# Patient Record
Sex: Female | Born: 1945 | Race: Black or African American | Hispanic: No | Marital: Single | Smoking: Former smoker
Health system: Southern US, Community
[De-identification: ages and names within clinical notes are randomized; demographics above are authoritative.]

## PROBLEM LIST (undated history)

## (undated) DIAGNOSIS — F0390 Unspecified dementia without behavioral disturbance: Secondary | ICD-10-CM

## (undated) DIAGNOSIS — I639 Cerebral infarction, unspecified: Secondary | ICD-10-CM

## (undated) HISTORY — PX: BRAIN SURGERY: SHX531

---

## 2013-07-25 ENCOUNTER — Encounter (HOSPITAL_COMMUNITY): Payer: Self-pay | Admitting: *Deleted

## 2013-07-25 ENCOUNTER — Emergency Department (HOSPITAL_COMMUNITY)
Admission: EM | Admit: 2013-07-25 | Discharge: 2013-07-25 | Disposition: A | Discharge status: Home / Self Care | Payer: Medicare Other | Attending: Emergency Medicine | Admitting: Emergency Medicine

## 2013-07-25 ENCOUNTER — Emergency Department (HOSPITAL_COMMUNITY): Payer: Medicare Other

## 2013-07-25 DIAGNOSIS — F29 Unspecified psychosis not due to a substance or known physiological condition: Secondary | ICD-10-CM | POA: Diagnosis not present

## 2013-07-25 DIAGNOSIS — F4489 Other dissociative and conversion disorders: Secondary | ICD-10-CM

## 2013-07-25 DIAGNOSIS — R4182 Altered mental status, unspecified: Secondary | ICD-10-CM | POA: Diagnosis present

## 2013-07-25 HISTORY — DX: Cerebral infarction, unspecified: I63.9

## 2013-07-25 LAB — COMPREHENSIVE METABOLIC PANEL
ALT: 10 U/L (ref 0–35)
AST: 18 U/L (ref 0–37)
Albumin: 4.3 g/dL (ref 3.5–5.2)
Alkaline Phosphatase: 89 U/L (ref 39–117)
BUN: 16 mg/dL (ref 6–23)
CO2: 26 mEq/L (ref 19–32)
Calcium: 10 mg/dL (ref 8.4–10.5)
Chloride: 101 mEq/L (ref 96–112)
Creatinine, Ser: 0.73 mg/dL (ref 0.50–1.10)
GFR calc Af Amer: >90 mL/min (ref >90)
GFR calc non Af Amer: 88 mL/min — ABNORMAL LOW (ref >90)
Glucose, Bld: 101 mg/dL — ABNORMAL HIGH (ref 70–99)
Potassium: 3.7 mEq/L (ref 3.5–5.1)
Sodium: 139 mEq/L (ref 135–145)
Total Bilirubin: 0.3 mg/dL (ref 0.3–1.2)
Total Protein: 8 g/dL (ref 6.0–8.3)

## 2013-07-25 LAB — CBC WITH DIFFERENTIAL/PLATELET
Basophils Absolute: 0 10*3/uL (ref 0.0–0.1)
Basophils Relative: 0 % (ref 0–1)
Eosinophils Absolute: 0.1 10*3/uL (ref 0.0–0.7)
Eosinophils Relative: 1 % (ref 0–5)
HCT: 35.4 % — ABNORMAL LOW (ref 36.0–46.0)
Hemoglobin: 11.6 g/dL — ABNORMAL LOW (ref 12.0–15.0)
Lymphocytes Relative: 20 % (ref 12–46)
Lymphs Abs: 1.7 10*3/uL (ref 0.7–4.0)
MCH: 29.1 pg (ref 26.0–34.0)
MCHC: 32.8 g/dL (ref 30.0–36.0)
MCV: 88.9 fL (ref 78.0–100.0)
Monocytes Absolute: 0.5 10*3/uL (ref 0.1–1.0)
Monocytes Relative: 6 % (ref 3–12)
Neutro Abs: 6.2 10*3/uL (ref 1.7–7.7)
Neutrophils Relative %: 72 % (ref 43–77)
Platelets: 295 10*3/uL (ref 150–400)
RBC: 3.98 MIL/uL (ref 3.87–5.11)
RDW: 14 % (ref 11.5–15.5)
WBC: 8.5 10*3/uL (ref 4.0–10.5)

## 2013-07-25 LAB — AMMONIA: Ammonia: 33 umol/L (ref 11–60)

## 2013-07-25 LAB — ETHANOL: Alcohol, Ethyl (B): <11 mg/dL (ref 0–11)

## 2013-07-25 NOTE — Progress Notes (Signed)
07/25/2013 [name] RNCM 1906pm [facility] consulted by ACT team member for possible home health services/community resources.  Patient does not have pcp.  EDCM provided list of pcps who accept blue medicare insurance within a ten mile radius of patient's zip code.  This list was given to patient's son [name].  Also provided information on [facility] and [address] in [place] for memory care.  As per patient's son, he has already received list of home health agencies from day shift case Production designer, theatre/television/film.  As per patient's son, he believes this is all a big mistake that she wandered. Since his mother has only been in [place] for thirty days, he believes she is just unfamiliar with the area.  He reports that when his mother was found wandering, she was fully dressed and had her purse with her personal information.  Patient's son reports the patient lives with him.  She does not have any medical equipment at home.  Patient's son reports he works at [employer] and he schedule is very flexible.  EDCM repeated the importance of finding a pcp for this patient.  Patient's son verbalized understanding.  No further needs at this time.  Patient's son thankful for services.

## 2013-07-25 NOTE — BH Assessment (Addendum)
Assessment Note  Janet Gutierrez is a 67 y.o. widowed black female.  She presents at [facility], and at the time of this assessment was accompanied in her room by her aunt, Wyn Quaker [phone]).  At the request of the pt the aunt remained in the room for the assessment.  There were many questions that the pt was unable to answer, and the aunt provided a great deal of collateral information.  I later spoke to the pt's adult son, Franky Macho, with whom pt lives and who was waiting in the lobby at [facility], obtaining further information.  The aunt reports that the pt was found wandering the streets by a female passer by, asking for directions.  Because she seemed lost and confused the passer by gave her a ride to the ED, and after going through the pt's purse, found the phone numbers of family and called them.  Stressors: Pt reportedly relocated to [facility] about a month ago to live with her 31 y/o son, this after living in [place] for about 50 years.  According to the aunt, with further detail later independently obtained from the son, pt had a stroke with a blood clot in her brain when she was about 67 y/o.  In addition to having right side hemiparesis, which gradually resolved over time, pt was told that as she aged she was likely to experience memory problems.  Lethality: Suicidality:  Pt denies SI currently or at any time in the past.  She denies any history of suicide attempts, or of self mutilation. Homicidality: Pt denies homicidal thoughts or physical aggression.  Pt denies having access to firearms.  Pt denies having any legal problems at this time.  Pt is calm, cooperative and pleasant during assessment. Psychosis: Pt denies hallucinations.  Pt does not appear to be responding to internal stimuli and exhibits no delusional thought.  Pt's reality testing appears to be intact.  However she is oriented only to person, being unable to identify her location or who the President of the [place]  is.  When asked about the date, she replies "[birth year]," and when asked about the day of the week, and whether it is day or night, she offers only random numbers.  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  When asked if she has had any significant weight change recently, she replies, "It looks like an eye."  She is unable to say why she is in the ED, responding to the question by saying, "When you come you see what happens to the people...," with the response becoming more rambling and irrelevant thereafter.  The aunt reports that pt's thoughts and behaviors have only become this bizarre over the past 30 days.  The son reports that pt is used to leaving home to shop and visit in her neighborhood in [place], but always knew how to find her way back home.  Moreover, he reports that today she did not leave appliances on, that she locked the home when she left, and that she was appropriately dressed and had her purse with her. Substance Abuse: Pt denies any current or past substance abuse problems.  Pt does not appear to be intoxicated or in withdrawal at this time.  Social Supports: In addition to the son with whom  she lives, pt has another adult son.  Franky Macho, the son with whom she lives, reports that his fiancee is also very supportive of the pt.  The accompanying aunt is clearly supportive and has good rapport with the pt.  The pt and the aunt also report that the pt has a cousin who is regularly involved with her.  Please note that most of her family supports live in the [place] area.  Pt reports that she is a widow, but requires the aunt's assistance to identify the year of the spouse's death as 68 or 47.  Treatment History: Pt denies any history of psychiatric hospitalization.  When asked about outpatient treatment  history she replies that she saw an unspecified provider for about 3 visits, 6 - 7 years ago, but given her mental status and the rambling nature of her reply, it is unclear how reliable this report is.  No one could corroborate it.  She reportedly does not have a PCP, at least in the [place] area.  At this time pt is unable to identify why she is in the ED, and much less what her treatment needs are.  Her son reports that he would prefer for his mother in to remain his household, where he believes he can keep her safe.  He reports that he would like to receive information about community resources and home health support in order to provide a living situation that is both safe and stimulating for the pt.  He is willing to adjust his work schedule in order to be with the pt during the day while alternative community based plans are being put in place.  Axis I: Dementia NOS 294.8 Axis II: Deferred 799.9 Axis III:  Past Medical History  Diagnosis Date  . CVA (cerebral vascular accident) 07/25/2013    At 67 y/o with right side hemiparesis, now largely resolved   Axis IV: problems related to social environment and problems with access to health care services Axis V: GAF = 55  Past Medical History:  Past Medical History  Diagnosis Date  . CVA (cerebral vascular accident) 07/25/2013    At 68 y/o with right side hemiparesis, now largely resolved    No past surgical history on file.  Family History: No family history on file.  Social History:  reports that she has never smoked. She has never used smokeless tobacco. She reports that she does not drink alcohol or use illicit drugs.  Additional Social History:  Alcohol / Drug Use Pain Medications: Denies Prescriptions: Denies Over the Counter: Denies History of alcohol / drug use?: No history of alcohol / drug abuse  CIWA: CIWA-Ar BP: 125/78 mmHg Pulse Rate: 81 COWS:    Allergies: No Known Allergies  Home Medications:  (Not in a  hospital admission)  OB/GYN Status:  No LMP recorded.  General Assessment Data Location of Assessment: [facility] Is this a Tele or Face-to-Face Assessment?: Face-to-Face Is this an Initial Assessment or a Re-assessment for this encounter?: Initial Assessment Living Arrangements: Children (57 y/o son) Can pt return to current living arrangement?: Yes Admission Status: Voluntary Is patient capable of signing voluntary admission?: Yes Transfer from: Acute Hospital Referral Source: Other [place])  Medical Screening Exam [facility]) Medical Exam completed: No Reason for MSE not completed: Other: (Medically cleared at [facility])  [facility] Crisis Care Plan Living Arrangements: Children (38 y/o son) Name of Psychiatrist: None Name of Therapist: None  Education Status Is patient currently in school?: No  Risk to  self Suicidal Ideation: No Suicidal Intent: No Is patient at risk for suicide?: No Suicidal Plan?: No Access to Means: No What has been your use of drugs/alcohol within the last 12 months?: Denies Previous Attempts/Gestures: No How many times?: 0 Other Self Harm Risks: At risk to wander off and lose direction home. Triggers for Past Attempts: Other (Comment) (Not applicable) Intentional Self Injurious Behavior: None Family Suicide History: No Recent stressful life event(s): Other (Comment) (Relocation to [place] 1 month ago.) Persecutory voices/beliefs?: No Depression: No Depression Symptoms:  (Denies) Substance abuse history and/or treatment for substance abuse?: No Suicide prevention information given to non-admitted patients: Yes  Risk to Others Homicidal Ideation: No Thoughts of Harm to Others: No Current Homicidal Intent: No Current Homicidal Plan: No Access to Homicidal Means: No Identified Victim: None History of harm to others?: No Assessment of Violence: None Noted Violent Behavior Description: Calm, cooperative, pleasant Does patient have access to  weapons?: No Criminal Charges Pending?: No Does patient have a court date: No  Psychosis Hallucinations: None noted Delusions: None noted  Mental Status Report Appear/Hygiene: Other (Comment) (Paper scrubs) Eye Contact: Good Motor Activity: Unremarkable Speech: Other (Comment) (Unremarkable) Level of Consciousness: Alert Mood: Other (Comment) (Pleasant, jocular) Affect: Appropriate to circumstance Anxiety Level: Minimal Thought Processes: Irrelevant Judgement: Impaired Orientation: Person (Date: [birth year]; not to day of week,hour, location, president) Obsessive Compulsive Thoughts/Behaviors: None  Cognitive Functioning Concentration: Decreased Memory: Remote Impaired;Recent Impaired IQ: Average Insight: Fair Impulse Control: Fair (Wandered off for the first time today.) Appetite: Good Weight Loss:  (Pt replies, "It looks like an eye.") Weight Gain:  (Unknown; irrelevant reply.) Sleep: No Change Total Hours of Sleep: 8 (7 - 8 hrs/night) Vegetative Symptoms: None  ADLScreening [facility] Assessment Services) Patient's cognitive ability adequate to safely complete daily activities?: Yes Patient able to express need for assistance with ADLs?: Yes Independently performs ADLs?: Yes (appropriate for developmental age)  Prior Inpatient Therapy Prior Inpatient Therapy: No  Prior Outpatient Therapy Prior Outpatient Therapy: Yes Prior Therapy Dates: 6 - 7 yrs ago, unknown outpatient provider for unspecified reason.  ADL Screening (condition at time of admission) Patient's cognitive ability adequate to safely complete daily activities?: Yes Is the patient deaf or have difficulty hearing?: No Does the patient have difficulty seeing, even when wearing glasses/contacts?: No Does the patient have difficulty concentrating, remembering, or making decisions?: Yes Patient able to express need for assistance with ADLs?: Yes Does the patient have difficulty dressing or bathing?:  No Independently performs ADLs?: Yes (appropriate for developmental age) Does the patient have difficulty walking or climbing stairs?: No Weakness of Legs: None Weakness of Arms/Hands: None  Home Assistive Devices/Equipment Home Assistive Devices/Equipment: Eyeglasses (Does not use; "they didn't work")    Abuse/Neglect Assessment (Assessment to be complete while patient is alone) Physical Abuse: Denies Verbal Abuse: Denies Sexual Abuse: Denies Exploitation of patient/patient's resources: Denies Self-Neglect: Denies Values / Beliefs Cultural Requests During Hospitalization: None Spiritual Requests During Hospitalization: None   Advance Directives (For Healthcare) Advance Directive:  (Pt does not know; given AD packet.) Nutrition Screen- [facility] Adult/[facility] Patient's home diet: Regular  Additional Information 1:1 In Past 12 Months?: No CIRT Risk: No Elopement Risk: No Does patient have medical clearance?: Yes     Disposition:  Disposition Initial Assessment Completed for this Encounter: Yes Disposition of Patient: Referred to (Case management for outpatient resources) Patient referred to: Other (Comment) (Case management for outpatient resources) After consulting with EDP Dr Gwendolyn Grant, and behavioral health provider Stephani Police, PA, it  has been determined that pt does not present a life threatening danger to herself or others, and that psychiatric hospitalization is not indicated for her.  Pt was referred to Amy, RN, Case Manager, who agreed to speak with pt's son about community resources.  Dr Gwendolyn Grant also agreed to provide further outpatient resources.  Pt to be discharged to the community.  On Site Evaluation by:   Reviewed with Physician:  EDP Dr Gwendolyn Grant @ 18:50, and Donell Sievert, PA @ 19:05  Doylene Canning, MA Triage Specialist Raphael Gibney 07/25/2013 7:26 PM

## 2013-07-25 NOTE — ED Provider Notes (Signed)
Care from Dr. Juleen China. Awaiting Psych assessment. Psych states patient is stable for discharge. She is not acutely pyschotic, she has had progressive worsening of her condition. They feel this is likely dementia. Patient here doing well, relaxing. Psych believes an adult program for patient's her age would be appropriate. Resources given. Son will stay with patient for next 30 days. Stable for discharge.   1. Confusion state      Dagmar Hait, MD 07/25/13 1939

## 2013-07-25 NOTE — ED Notes (Signed)
Pt aaox2.  Pt present with confusion and wandering.  Pt found by ems.  Pt has no hx per her cousin from [place].

## 2013-07-25 NOTE — ED Notes (Signed)
Bed: WA03 Expected date:  Expected time:  Means of arrival:  Comments: Found wandering

## 2013-07-25 NOTE — ED Notes (Signed)
Pt present to ED via EMS with c/o "wandering and confusion"  Pt present via EMS found wandering on [address]  Pt alert to person and situation.  Pt reports "I was trying to go to aunt Albertha house because she is nice>"  Pt deneis pain.  Per EMS, pt moved to [place] from [place] by son to live with him.  Per ems, pt cousin from [place] was contacted reports confusion and no other hx known.  Pt cousin, "cookie" is inroute to hospital.  Pt son ane is [place] [phone].  Unable to contact phone.

## 2013-07-25 NOTE — ED Provider Notes (Signed)
CSN: [id number]     Arrival date & time 07/25/13  1230 History   First MD Initiated Contact with Patient 07/25/13 1241     Chief Complaint  Patient presents with  . Altered Mental Status   (Consider location/radiation/quality/duration/timing/severity/associated sxs/prior Treatment) HPI  67yF found wandering. Pt unable to tell me how she ended up where she was picked up or where she was going. Seems to think she is in [place]. She appears to be in NAD, but difficulty answering simple questions such as where she lives or what her children's names are. Denies trauma. No pain. Denies ingestion. Doesn't seem to have diagnosed psych history, but I do not feel she is a reliable historian. Denies taking any medications.   No past medical history on file. No past surgical history on file. No family history on file. History  Substance Use Topics  . Smoking status: Not on file  . Smokeless tobacco: Not on file  . Alcohol Use: Not on file   OB History   No data available     Review of Systems  All systems reviewed and negative, other than as noted in HPI.   Allergies  Review of patient's allergies indicates not on file.  Home Medications  No current outpatient prescriptions on file. BP 133/51  Pulse 84  Temp(Src) 98.9 F (37.2 C) (Oral)  Resp 18  SpO2 97% Physical Exam  Nursing note and vitals reviewed. Constitutional: She appears well-developed and well-nourished. No distress.  HENT:  Head: Normocephalic and atraumatic.  Eyes: Conjunctivae and EOM are normal. Pupils are equal, round, and reactive to light. Right eye exhibits no discharge. Left eye exhibits no discharge.  Neck: Neck supple.  Cardiovascular: Normal rate, regular rhythm and normal heart sounds.  Exam reveals no gallop and no friction rub.   No murmur heard. Pulmonary/Chest: Effort normal and breath sounds normal. No respiratory distress.  Abdominal: Soft. She exhibits no distension. There is no tenderness.   Musculoskeletal: She exhibits no edema and no tenderness.  Neurological: She is alert. No cranial nerve deficit. She exhibits normal muscle tone. Coordination normal.  Disoriented to place. Gait steady.   Skin: Skin is warm and dry.  Psychiatric:  Pleasant and agreeable. Cooperative. Seems easily distracted. Possible thought blocking?     ED Course  Procedures (including critical care time) Labs Review Labs Reviewed  CBC WITH DIFFERENTIAL - Abnormal; Notable for the following:    Hemoglobin 11.6 (*)    HCT 35.4 (*)    All other components within normal limits  COMPREHENSIVE METABOLIC PANEL - Abnormal; Notable for the following:    Glucose, Bld 101 (*)    GFR calc non Af Amer 88 (*)    All other components within normal limits  ETHANOL  AMMONIA  URINALYSIS, ROUTINE W REFLEX MICROSCOPIC  URINE RAPID DRUG SCREEN (HOSP PERFORMED)   Imaging Review Ct Head Wo Contrast  07/25/2013   CLINICAL DATA:  Confusion, altered mental status  EXAM: CT HEAD WITHOUT CONTRAST  TECHNIQUE: Contiguous axial images were obtained from the base of the skull through the vertex without intravenous contrast.  COMPARISON:  None.  FINDINGS: No evidence of parenchymal hemorrhage or extra-axial fluid collection. No mass lesion, mass effect, or midline shift.  No CT evidence of acute infarction.  Encephalomalacic changes in the right cerebellum with overlying craniotomy.  Subcortical white matter and periventricular small vessel ischemic changes.  Cerebral volume is age appropriate. No ventriculomegaly.  The visualized paranasal sinuses are essentially clear. The mastoid  air cells are unopacified.  No evidence of calvarial fracture.  IMPRESSION: No evidence of acute intracranial abnormality.  Encephalomalacic changes in the right cerebellum with overlying craniotomy.  Small vessel ischemic changes.   Electronically Signed   By: Charline Bills M.D.   On: 07/25/2013 13:35    EKG Interpretation   None       MDM    1. Confusion state     67 year old female found wandering. He appears to have some degree of cognitive impairment or psychiatric illness. No past psychiatric care or diagnoses. Son now at bedside. The symptoms have been present for at least several months. Son himself is not particularly good historian either. Pt does have past history of stroke and CT with evidence of this. This stroke seems to have preceded her current symptoms by quite some time though. There does not appear to be an acute organic medical basis for her current symptoms. I feel she would benefit from a psychiatric assessment.    Raeford Razor, MD 07/25/13 [phone]

## 2013-07-25 NOTE — Progress Notes (Signed)
   CARE MANAGEMENT ED NOTE 07/25/2013  Patient:  ROSANNE, WOHLFARTH   Account Number:  [id number]  Date Initiated:  07/25/2013  Documentation initiated by:  Edd Arbour  Subjective/Objective Assessment:   68 yr old blue medicare pt with female and female family members at bedside Female confirmed pt without pcp Provided updated insurance card for Cm to present to ED registration c/o found wandering in [place] [address]     Subjective/Objective Assessment Detail:     Action/Plan:   Cm spoke with pt, female cousin and female, son, quinton, at bedside. Cm took updated insurance card to [facility] ED registration for pt/family CM called and spoke with ED SW about case concerns TTS consult noted in EPIC   Action/Plan Detail:   Spoke with Quinton to discuss need to find an in network pcp for blue medicare in [place] by contacting the [employer] on back of insurance card   Anticipated DC Date:       Status Recommendation to Physician:   Result of Recommendation:    Other ED Services  Consult Working Plan   In-house referral  Clinical Social Worker   DC Associate Professor  Other  PCP issues  Outpatient Services - Pt will follow up    Choice offered to / List presented to:            Status of service:  Completed, signed off  ED Comments:  CM made attempt to find a list online but unable to get [employer] online site to activate.  Provided a list of [place] home health agencies and private duty nursing services. Provided information on [facility]  ED Comments Detail:  adult day care, respite programs and caregiver services

## 2013-09-06 ENCOUNTER — Ambulatory Visit (INDEPENDENT_AMBULATORY_CARE_PROVIDER_SITE_OTHER): Payer: Medicare Other | Admitting: Neurology

## 2013-09-06 ENCOUNTER — Encounter: Payer: Self-pay | Admitting: Neurology

## 2013-09-06 VITALS — BP 144/70 | HR 80 | Temp 98.3°F | Wt 237.0 lb

## 2013-09-06 DIAGNOSIS — F0390 Unspecified dementia without behavioral disturbance: Secondary | ICD-10-CM

## 2013-09-06 DIAGNOSIS — F068 Other specified mental disorders due to known physiological condition: Secondary | ICD-10-CM

## 2013-09-06 NOTE — Addendum Note (Signed)
Addended by: Benay Spice on: 09/06/2013 04:51 PM   Modules accepted: Orders

## 2013-09-06 NOTE — Progress Notes (Signed)
NEUROLOGY CONSULTATION NOTE  Janet Gutierrez MRN: [id number] DOB: [birth year]  Referring provider: Dr. Lonzo Gutierrez Primary care provider: Dr. Lonzo Gutierrez  Reason for consult:  Dementia.  HISTORY OF PRESENT ILLNESS: Janet Gutierrez is a 67 year old left-handed (since stroke) woman with history of hemorrhagic stroke who presents for evaluation of dementia.  Records and images were personally reviewed where available.    She was brought to the [facility] ED on 07/25/13 in a confused state.  As per the chart, she was found wandering.  She was oriented to self and situation but unable to tell the physician her destination.  She thought she was in [name].  She was unable to answer her address or the names of her children.  According to her son, this was a misunderstanding.  They had just moved to [name] from [name] because he got a new job.  They live together.  He says that she went out for a walk and a passerby thought she was confused.  He says the passerby saw her ID which had her Janet Gutierrez address and then took her to the ED.  He says that she did not know the names of her children because she must have thought they meant young children and not her grown two sons.  A CT of the brain was performed, which revealed encephalomalacia in the right cerebellum with craniotomy.  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  CBC and CMP unremarkable.  Ammonia 33.  ETOH level negative.  She was evaluated by psychiatry who determined she was not psychotic.  They felt she had an underlying dementia.  She has lived with her son for some time.  Her son says that she is disinterested and withdrawn.  This has been going on for at least a year.  It seemed to have started after her son lost his job in [place].  At that point, she no longer was going out shopping and stayed in the home.  She has not been social for quite some time, but her son says that is because most of  her friends have since passed away.  She reportedly is able to perform all activities of daily living including showering, dressing, and cooking.  She does not leave the stove on at home.  While her son is at work, she is alone and occupies her time by doing housework.  She reportedly has some difficulty remembering remote details such as past dates, however she can recall and sing along to many old songs on the radio.  Reportedly, her son says that short-term memory is intact.  She does not read much but watches TV.  She denies depression.  Son denies mood or personality changes.  Son denies hallucinations or delusions.  She was driving several months ago, but has not driven since at least moving to [name] in early October.  There is no reported history of dementia in the family.  She recently established care with Dr. Lonzo Gutierrez at [facility] [place].  He ordered TSH and B12 levels, results not available.  She had a hypertensive hemorrhagic stroke of the cerebellum at age 37, with subsequent right hemiparesis and craniotomy.  PAST MEDICAL HISTORY: Past Medical History  Diagnosis Date  . CVA (cerebral vascular accident)     At 67 y/o with right side hemiparesis, now largely resolved    PAST SURGICAL HISTORY: Past Surgical History  Procedure Laterality Date  . Brain surgery  age 55    MEDICATIONS: No current outpatient prescriptions on file prior to visit.   No current facility-administered medications on file prior to visit.    ALLERGIES: No Known Allergies  FAMILY HISTORY: No family history on file.  SOCIAL HISTORY: History   Social History  . Marital Status: Single    Spouse Name: N/A    Number of Children: N/A  . Years of Education: N/A   Occupational History  . Not on file.   Social History Main Topics  . Smoking status: Former Smoker    Quit date: 08/29/2009  . Smokeless tobacco: Never Used  . Alcohol Use: No  . Drug Use: No  . Sexual Activity: Not  on file   Other Topics Concern  . Not on file   Social History Narrative  . No narrative on file    REVIEW OF SYSTEMS: Constitutional: No fevers, chills, or sweats, no generalized fatigue, change in appetite Eyes: No visual changes, double vision, eye pain Ear, nose and throat: No hearing loss, ear pain, nasal congestion, sore throat Cardiovascular: No chest pain, palpitations Respiratory:  No shortness of breath at rest or with exertion, wheezes GastrointestinaI: No nausea, vomiting, diarrhea, abdominal pain, fecal incontinence Genitourinary:  No dysuria, urinary retention or frequency Musculoskeletal:  No neck pain, back pain Integumentary: No rash, pruritus, skin lesions Neurological: as above Psychiatric: No depression, insomnia, anxiety Endocrine: No palpitations, fatigue, diaphoresis, mood swings, change in appetite, change in weight, increased thirst Hematologic/Lymphatic:  No anemia, purpura, petechiae. Allergic/Immunologic: no itchy/runny eyes, nasal congestion, recent allergic reactions, rashes  PHYSICAL EXAM: Filed Vitals:   09/06/13 1457  BP: 144/70  Pulse: 80  Temp: 98.3 F (36.8 C)   General: No acute distress.  Unkempt. Head:  Normocephalic/atraumatic Neck: supple, no paraspinal tenderness, full range of motion Back: No paraspinal tenderness Heart: regular rate and rhythm Lungs: Clear to auscultation bilaterally. Vascular: No carotid bruits. Neurological Exam: Mental status: alert and oriented to self only (said she is in [name]).  Speech fluent and not dysarthric.  Unable to perform Trail Making test, copy a cube or correctly draw a clock face.  Unable to name 2 of 3 animals.  Able to repeat 1 of 2 sentences.  Unable to follow some simple commands.  She exhibited poor attention and unable to repeat list of numbers in forward or reverse order.  Unable to perform serial 7 subtraction.  Unable to name one word that starts with letter F.  Poor abstraction.   Poor immediate and delayed recall (unable to repeat or recall any words).  MOCA score 2/30. Cranial nerves: CN I: not tested CN II: pupils equal, round and reactive to light, visual fields intact, fundi not visualized. CN III, IV, VI:  full range of motion, no nystagmus, no ptosis CN V: facial sensation intact CN VII: upper and lower face symmetric CN VIII: hearing intact CN IX, X: gag intact, uvula midline CN XI: sternocleidomastoid and trapezius muscles intact CN XII: tongue midline Bulk & Tone: normal, no fasciculations. Motor: 5/5 throughout Sensation: pinprick and vibration intact. Deep Tendon Reflexes: 2+ throughout, absent in ankles, toes down Finger to nose testing:  No dysmetria Gait: wide-based, no ataxia.  Unable to tandem walk. Romberg negative.  IMPRESSION: Advanced cognitive impairment.  Most likely Alzheimer's, however, since multiple domains are now involved, and her son is a poor historian, it is difficult to say for sure.  PLAN: 1.  Will refer for formal neuropsychological testing. 2.  Will obtain lab  results (TSH and B12) from [facility]. 3.  Instructed to son that she cannot drive or go for walks by herself. 4.  Will likely start Namenda. 5.  Discussed with son that she may need 24 hour care and will likely need Home Health evaluation. 6.  Follow up right after neuropsych test.  45 minutes spent with patient and son, over 50% spent counseling and coordinating care.  Thank you for allowing me to take part in the care of this patient.  Shon Millet, DO  CC:  Orland Penman, MD

## 2013-09-06 NOTE — Patient Instructions (Addendum)
1.  We will refer for neuropsychological evaluation. 2.  No driving or going for a walk by herself. 3.  Follow up after neuropsych testing.   I will refer you to [facility] for your neuropsychological testing. They are located at [address] in [place], [place].  The phone number is [phone]. They will call you to schedule the appointment.

## 2013-11-25 ENCOUNTER — Ambulatory Visit: Payer: Medicare Other | Admitting: Neurology

## 2013-11-28 ENCOUNTER — Ambulatory Visit: Payer: Medicare Other | Admitting: Neurology

## 2013-11-30 ENCOUNTER — Ambulatory Visit (INDEPENDENT_AMBULATORY_CARE_PROVIDER_SITE_OTHER): Payer: Medicare Other | Admitting: Neurology

## 2013-11-30 ENCOUNTER — Other Ambulatory Visit: Payer: Self-pay | Admitting: *Deleted

## 2013-11-30 ENCOUNTER — Encounter: Payer: Self-pay | Admitting: Neurology

## 2013-11-30 VITALS — BP 104/78 | HR 68 | Resp 18 | Ht 65.5 in | Wt 227.4 lb

## 2013-11-30 DIAGNOSIS — G3183 Dementia with Lewy bodies: Principal | ICD-10-CM

## 2013-11-30 DIAGNOSIS — G309 Alzheimer's disease, unspecified: Principal | ICD-10-CM

## 2013-11-30 DIAGNOSIS — F0280 Dementia in other diseases classified elsewhere without behavioral disturbance: Secondary | ICD-10-CM

## 2013-11-30 MED ORDER — MEMANTINE HCL 5 MG PO TABS: ORAL_TABLET | ORAL | Status: DC

## 2013-11-30 NOTE — Progress Notes (Signed)
NEUROLOGY FOLLOW UP OFFICE NOTE  Janet Gutierrez [id number]  HISTORY OF PRESENT ILLNESS: Janet Gutierrez is a 68 year old left-handed (since stroke) woman with history of hemorrhagic stroke who follows up for evaluation of dementia.  Records and images were personally reviewed where available.    UPDATE: She underwent neuropsychological testing on 11/07/13 and 11/15/13.  Testing did reveal advanced global impairment but most likely suggested Alzheimer's disease.  She reportedly has not gone outside the house alone.    HISTORY: She was brought to the [facility] ED on 07/25/13 in a confused state.  As per the chart, she was found wandering.  She was oriented to self and situation but unable to tell the physician her destination.  She thought she was in [place].  She was unable to answer her address or the names of her children.  According to her son, this was a misunderstanding.  They had just moved to [facility] from [place] because he got a new job.  They live together.  He says that she went out for a walk and a passerby thought she was confused.  He says the passerby saw her ID which had her [name] address and then took her to the ED.  He says that she did not know the names of her children because she must have thought they meant young children and not her grown two sons.  A CT of the brain was performed, which revealed encephalomalacia in the right cerebellum with craniotomy.  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  CBC and CMP unremarkable.  Ammonia 33.  ETOH level negative.  She was evaluated by psychiatry who determined she was not psychotic.  They felt she had an underlying dementia.  She has lived with her son for some time.  Her son says that she is disinterested and withdrawn.  This has been going on for at least a year.  It seemed to have started after her son lost his job in [place].  At that point, she no longer was going out shopping and  stayed in the home.  She has not been social for quite some time, but her son says that is because most of her friends have since passed away.  She reportedly is able to perform all activities of daily living including showering, dressing, and cooking.  She does not leave the stove on at home.  While her son is at work, she is alone and occupies her time by doing housework.  She reportedly has some difficulty remembering remote details such as past dates, however she can recall and sing along to many old songs on the radio.  Reportedly, her son says that short-term memory is intact.  She does not read much but watches TV.  She denies depression.  Son denies mood or personality changes.  Son denies hallucinations or delusions.  She was driving several months ago, but has not driven since at least moving to [facility] in early October.  There is no reported history of dementia in the family.  She lives with her son, but he is not home around 16 hours of the day.  She recently established care with Dr. Lonzo Cloud at [facility] at [place].  He ordered TSH and B12 levels, results not available.  She had a hypertensive hemorrhagic stroke of the cerebellum at age 80, with subsequent right hemiparesis and craniotomy.  PAST MEDICAL HISTORY: Past Medical History  Diagnosis Date  . CVA (cerebral  vascular accident)     At 68 y/o with right side hemiparesis, now largely resolved    MEDICATIONS: No current outpatient prescriptions on file prior to visit.   No current facility-administered medications on file prior to visit.    ALLERGIES: No Known Allergies  FAMILY HISTORY: History reviewed. No pertinent family history.  SOCIAL HISTORY: History   Social History  . Marital Status: Single    Spouse Name: N/A    Number of Children: N/A  . Years of Education: N/A   Occupational History  . Not on file.   Social History Main Topics  . Smoking status: Former Smoker    Quit date: 08/29/2009  .  Smokeless tobacco: Never Used  . Alcohol Use: No  . Drug Use: No  . Sexual Activity: Not on file   Other Topics Concern  . Not on file   Social History Narrative  . No narrative on file    REVIEW OF SYSTEMS: Constitutional: No fevers, chills, or sweats, no generalized fatigue, change in appetite Eyes: No visual changes, double vision, eye pain Ear, nose and throat: No hearing loss, ear pain, nasal congestion, sore throat Cardiovascular: No chest pain, palpitations Respiratory:  No shortness of breath at rest or with exertion, wheezes GastrointestinaI: No nausea, vomiting, diarrhea, abdominal pain, fecal incontinence Genitourinary:  No dysuria, urinary retention or frequency Musculoskeletal:  No neck pain, back pain Integumentary: No rash, pruritus, skin lesions Neurological: as above Psychiatric: No depression, insomnia, anxiety Endocrine: No palpitations, fatigue, diaphoresis, mood swings, change in appetite, change in weight, increased thirst Hematologic/Lymphatic:  No anemia, purpura, petechiae. Allergic/Immunologic: no itchy/runny eyes, nasal congestion, recent allergic reactions, rashes  PHYSICAL EXAM: Filed Vitals:   11/30/13 0953  BP: 104/78  Pulse: 68  Resp: 18   General: No acute distress Head:  Normocephalic/atraumatic Neck: supple, no paraspinal tenderness, full range of motion Heart:  Regular rate and rhythm Lungs:  Clear to auscultation bilaterally Back: No paraspinal tenderness Neurological Exam: Alert and oriented to self and state only (not time).  Speech fluent and not dysarthric.  Able to name, repeat, and read.  Unable to write or follow 3 step commands across midline.  Unable to recall 3 words after a couple of minutes.  Unable to spell WORLD backwards, unable to copy intersecting pentagons.  MMSE 9/30.  CN II-XII intact.  Bulk and tone normal.  Muscle strength 5/5 throughout.  Sensation to light touch intact.  No dysmetria on finger to nose testing.   Wide-based gait.  Romberg negative.  IMPRESSION: Alzheimer's dementia  PLAN: 1.  Will initiate Namenda.  Will start 5mg  daily for 1 week, then 5mg  twice daily for 1 week, then goal of 10mg  twice daily.  Side effects such as headache, diarrhea, and constipation were discussed. 2.  Patient needs 24 hour supervision.  Will try to arrange for a home health visit. 3.  Will try and get B12 and TSH results from PCP. 4.  Follow up in 6 months.  Shon MilletAdam Jaffe, DO  CC:  Orland PenmanIbtehal Jaralla Shamleffer, MD

## 2013-11-30 NOTE — Patient Instructions (Signed)
1.  Start Namenda (memantine) 5mg  tablets.  Take 1 tablet at bedtime for 7 days, then 1 tablet twice daily for 7 days, then 1 tablet in morning and 2 tablets at bedtime for 7 days, then 2tablets twice daily.   Side effects include dizziness, headache, diarrhea or constipation.  Call with any questions or concerns.

## 2013-12-09 ENCOUNTER — Telehealth: Payer: Self-pay | Admitting: *Deleted

## 2013-12-09 NOTE — Telephone Encounter (Signed)
error 

## 2014-02-23 ENCOUNTER — Ambulatory Visit: Payer: Self-pay | Admitting: Podiatrist

## 2014-05-31 ENCOUNTER — Ambulatory Visit: Payer: Medicare Other | Admitting: Neurology

## 2014-05-31 ENCOUNTER — Telehealth: Payer: Self-pay | Admitting: Neurology

## 2014-05-31 NOTE — Telephone Encounter (Signed)
Pt no showed today's follow up appt w/ Dr. Everlena Cooper.    Marlane Hatcher - please send pt a no show letter / Oneita Kras.

## 2014-06-01 ENCOUNTER — Encounter: Payer: Self-pay | Admitting: *Deleted

## 2014-06-01 NOTE — Progress Notes (Signed)
No show letter sent for 05-26-2014 

## 2015-01-29 ENCOUNTER — Ambulatory Visit: Admission: AD | Admit: 2015-01-29 | Payer: Self-pay | Source: Ambulatory Visit | Admitting: Vascular Surgery

## 2015-01-29 ENCOUNTER — Encounter: Admission: AD | Payer: Self-pay | Source: Ambulatory Visit

## 2015-01-29 SURGERY — A/V SHUNTOGRAM/FISTULAGRAM
Anesthesia: Moderate Sedation

## 2017-04-14 ENCOUNTER — Ambulatory Visit
Admission: RE | Admit: 2017-04-14 | Discharge: 2017-04-14 | Disposition: A | Discharge status: Home / Self Care | Payer: Medicare (Managed Care) | Source: Ambulatory Visit | Attending: Nurse Practitioner | Admitting: Nurse Practitioner

## 2017-04-14 ENCOUNTER — Other Ambulatory Visit: Payer: Self-pay

## 2017-04-14 ENCOUNTER — Other Ambulatory Visit: Payer: Self-pay | Admitting: Nurse Practitioner

## 2017-04-14 DIAGNOSIS — M25562 Pain in left knee: Secondary | ICD-10-CM

## 2017-04-14 DIAGNOSIS — T1490XA Injury, unspecified, initial encounter: Secondary | ICD-10-CM

## 2017-04-14 DIAGNOSIS — M79605 Pain in left leg: Secondary | ICD-10-CM

## 2017-05-01 ENCOUNTER — Emergency Department (HOSPITAL_COMMUNITY): Payer: Medicare (Managed Care)

## 2017-05-01 ENCOUNTER — Observation Stay (HOSPITAL_COMMUNITY): Payer: Medicare (Managed Care)

## 2017-05-01 ENCOUNTER — Inpatient Hospital Stay (HOSPITAL_COMMUNITY)
Admission: EM | Admit: 2017-05-01 | Discharge: 2017-05-04 | DRG: 101 | Disposition: A | Discharge status: Home / Self Care | Payer: Medicare (Managed Care) | Attending: Oncology | Admitting: Oncology

## 2017-05-01 ENCOUNTER — Encounter (HOSPITAL_COMMUNITY): Payer: Self-pay | Admitting: Emergency Medicine

## 2017-05-01 DIAGNOSIS — R404 Transient alteration of awareness: Secondary | ICD-10-CM

## 2017-05-01 DIAGNOSIS — R4182 Altered mental status, unspecified: Secondary | ICD-10-CM | POA: Diagnosis present

## 2017-05-01 DIAGNOSIS — F0280 Dementia in other diseases classified elsewhere without behavioral disturbance: Secondary | ICD-10-CM | POA: Diagnosis present

## 2017-05-01 DIAGNOSIS — G309 Alzheimer's disease, unspecified: Secondary | ICD-10-CM | POA: Diagnosis present

## 2017-05-01 DIAGNOSIS — R569 Unspecified convulsions: Secondary | ICD-10-CM | POA: Diagnosis not present

## 2017-05-01 DIAGNOSIS — F0150 Vascular dementia without behavioral disturbance: Secondary | ICD-10-CM | POA: Diagnosis present

## 2017-05-01 DIAGNOSIS — R410 Disorientation, unspecified: Secondary | ICD-10-CM | POA: Diagnosis not present

## 2017-05-01 DIAGNOSIS — Z87891 Personal history of nicotine dependence: Secondary | ICD-10-CM

## 2017-05-01 DIAGNOSIS — G319 Degenerative disease of nervous system, unspecified: Secondary | ICD-10-CM | POA: Diagnosis present

## 2017-05-01 DIAGNOSIS — Z8673 Personal history of transient ischemic attack (TIA), and cerebral infarction without residual deficits: Secondary | ICD-10-CM

## 2017-05-01 DIAGNOSIS — Z79899 Other long term (current) drug therapy: Secondary | ICD-10-CM

## 2017-05-01 DIAGNOSIS — G9389 Other specified disorders of brain: Secondary | ICD-10-CM | POA: Diagnosis present

## 2017-05-01 HISTORY — DX: Unspecified dementia, unspecified severity, without behavioral disturbance, psychotic disturbance, mood disturbance, and anxiety: F03.90

## 2017-05-01 LAB — CBC WITH DIFFERENTIAL/PLATELET
Basophils Absolute: 0 10*3/uL (ref 0.0–0.1)
Basophils Relative: 0 %
Eosinophils Absolute: 0 10*3/uL (ref 0.0–0.7)
Eosinophils Relative: 1 %
HEMATOCRIT: 32.7 % — AB (ref 36.0–46.0)
HEMOGLOBIN: 11 g/dL — AB (ref 12.0–15.0)
LYMPHS ABS: 1.4 10*3/uL (ref 0.7–4.0)
LYMPHS PCT: 27 %
MCH: 30.6 pg (ref 26.0–34.0)
MCHC: 33.6 g/dL (ref 30.0–36.0)
MCV: 90.8 fL (ref 78.0–100.0)
Monocytes Absolute: 0.4 10*3/uL (ref 0.1–1.0)
Monocytes Relative: 7 %
NEUTROS ABS: 3.5 10*3/uL (ref 1.7–7.7)
NEUTROS PCT: 65 %
Platelets: 333 10*3/uL (ref 150–400)
RBC: 3.6 MIL/uL — AB (ref 3.87–5.11)
RDW: 13.7 % (ref 11.5–15.5)
WBC: 5.3 10*3/uL (ref 4.0–10.5)

## 2017-05-01 LAB — RAPID URINE DRUG SCREEN, HOSP PERFORMED
Amphetamines: NOT DETECTED
BENZODIAZEPINES: NOT DETECTED
Barbiturates: NOT DETECTED
Cocaine: NOT DETECTED
Opiates: NOT DETECTED
Tetrahydrocannabinol: NOT DETECTED

## 2017-05-01 LAB — BASIC METABOLIC PANEL
Anion gap: 8 (ref 5–15)
BUN: 11 mg/dL (ref 6–20)
CHLORIDE: 102 mmol/L (ref 101–111)
CO2: 30 mmol/L (ref 22–32)
Calcium: 9.7 mg/dL (ref 8.9–10.3)
Creatinine, Ser: 0.64 mg/dL (ref 0.44–1.00)
GFR calc Af Amer: >60 mL/min (ref >60)
GFR calc non Af Amer: >60 mL/min (ref >60)
GLUCOSE: 97 mg/dL (ref 65–99)
POTASSIUM: 4 mmol/L (ref 3.5–5.1)
Sodium: 140 mmol/L (ref 135–145)

## 2017-05-01 LAB — TROPONIN I

## 2017-05-01 LAB — URINALYSIS, ROUTINE W REFLEX MICROSCOPIC
Bilirubin Urine: NEGATIVE
Glucose, UA: NEGATIVE mg/dL
HGB URINE DIPSTICK: NEGATIVE
Ketones, ur: NEGATIVE mg/dL
Leukocytes, UA: NEGATIVE
NITRITE: NEGATIVE
Protein, ur: NEGATIVE mg/dL
SPECIFIC GRAVITY, URINE: 1.011 (ref 1.005–1.030)
pH: 7 (ref 5.0–8.0)

## 2017-05-01 LAB — AMMONIA: AMMONIA: 13 umol/L (ref 9–35)

## 2017-05-01 LAB — VITAMIN B12: VITAMIN B 12: 905 pg/mL (ref 180–914)

## 2017-05-01 LAB — ETHANOL: Alcohol, Ethyl (B): <5 mg/dL (ref <5)

## 2017-05-01 LAB — TSH: TSH: 1.324 u[IU]/mL (ref 0.350–4.500)

## 2017-05-01 MED ORDER — ACETAMINOPHEN 650 MG RE SUPP
650.0000 mg | Freq: Four times a day (QID) | RECTAL | Status: DC | PRN
Start: 2017-05-01 — End: 2017-05-04

## 2017-05-01 MED ORDER — SODIUM CHLORIDE 0.9% FLUSH
3.0000 mL | Freq: Two times a day (BID) | INTRAVENOUS | Status: DC
  Administered 2017-05-01 – 2017-05-03 (×4): 3 mL via INTRAVENOUS

## 2017-05-01 MED ORDER — DEXTROSE-NACL 5-0.45 % IV SOLN
INTRAVENOUS | Status: AC
  Administered 2017-05-01: 10:00:00 via INTRAVENOUS

## 2017-05-01 MED ORDER — SODIUM CHLORIDE 0.9 % IV SOLN
1500.0000 mg | Freq: Once | INTRAVENOUS | Status: AC
  Administered 2017-05-01: 1500 mg via INTRAVENOUS
  Filled 2017-05-01: qty 15

## 2017-05-01 MED ORDER — ENOXAPARIN SODIUM 40 MG/0.4ML ~~LOC~~ SOLN
40.0000 mg | Freq: Every day | SUBCUTANEOUS | Status: DC
  Administered 2017-05-01 – 2017-05-04 (×4): 40 mg via SUBCUTANEOUS
  Filled 2017-05-01 (×5): qty 0.4

## 2017-05-01 MED ORDER — SODIUM CHLORIDE 0.9 % IV SOLN
500.0000 mg | Freq: Two times a day (BID) | INTRAVENOUS | Status: DC
  Administered 2017-05-01 – 2017-05-03 (×4): 500 mg via INTRAVENOUS
  Filled 2017-05-01 (×6): qty 5

## 2017-05-01 MED ORDER — ACETAMINOPHEN 325 MG PO TABS: 650.0000 mg | ORAL_TABLET | Freq: Four times a day (QID) | ORAL | Status: DC | PRN

## 2017-05-01 NOTE — ED Triage Notes (Signed)
Pt arrived EMS from home her son stating that when he got her up to go to the bathroom at 12am this morning he walked her to the bathroom and after she went to the bathroom when he attempted to get her back up to go to the bed she "stiffened" and wouldn't get up from the toilet

## 2017-05-01 NOTE — Progress Notes (Signed)
   05/01/17 1103  Seizure Activity  Psychomotor Symptoms Behavior pause;Incontinence  Duration 5  Post- Ictal Other (Comment) (BACK TO BASELINE)  Interventions Airway support;Medication;Suction;Side-lying Position;Seizure Pads;Notified MD/Provider  Tasking (Response) Aware of seizure;Unable to respond   Vital signs stable post seizure,a pt remained in a stiff position  For about 5 mins post seizure. MD Allena KatzPatel aware and will be assessing pt at the bedside. Neurology will be consulted, Keppra iv ordered. Daughter in law present and sates pt had the same symptoms at home prior to admission. Will cont. To monitor pt.

## 2017-05-01 NOTE — Consult Note (Signed)
Neurology Consultation Reason for Consult: Seizure vs spell Referring Physician: Wyvonnia LoraL KLIMA  CC: Loss of consciousness  History is obtained from:Daughter   HPI: [name] is a 71 year old female with advanced dementia and history of hemorrhagic stroke at age 71 who presented after an episode of loss of consciousness while in the bathroom yesterday. I spoke to her daughter-in-law who said that around 1 AM when her son took her to the bathroom, she suddenly tensed up and lost consciousness for about 30 seconds. She tells me that the patient did not have a bowel movement which is contacting to the initial history. Following the episode the patient was lethargic for several hours. She was admitted by the medicine team for workup to rule out metabolic and infectious conditions are altered mental status. Neurology was consulted for evaluation of seizures.  The patient is nonverbal at baseline, does not follow commands. She does ambulate with some help and does take food and fed. Is not having any episodes of staring spells are being somnolent for hours in the past. Was also informed that the patient had similar episode while in bed today witnessed by the nurse. Was unable to get ahold of the nurse to get the description of the event.  ROS: A 14 point ROS was performed and is negative except as noted in the HPI.    Past Medical History:  Diagnosis Date  . CVA (cerebral vascular accident) (HCC)    At 71 y/o with right side hemiparesis, now largely resolved  . Dementia      Family History  Problem Relation Age of Onset  . Diabetes Mother      Social History:  reports that she quit smoking about 7 years ago. She has never used smokeless tobacco. She reports that she does not drink alcohol or use drugs.   Exam: Current vital signs: BP (!) 106/52 (BP Location: Left Arm)   Pulse 61   Temp 97.6 F (36.4 C) (Oral)   Resp 18   Ht 5\' 5"  (1.651 m)   Wt 61.2 kg (134 lb 14.7 oz)   SpO2 93%   BMI  22.45 kg/m  Vital signs in last 24 hours: Temp:  [97.5 F (36.4 C)-98.4 F (36.9 C)] 97.6 F (36.4 C) (08/03 2137) Pulse Rate:  [55-117] 61 (08/03 2157) Resp:  [10-18] 18 (08/03 2137) BP: (91-128)/(42-72) 106/52 (08/03 2157) SpO2:  [93 %-100 %] 93 % (08/03 2137) Weight:  [61.2 kg (134 lb 14.7 oz)-61.2 kg (135 lb)] 61.2 kg (134 lb 14.7 oz) (08/03 1121)   Physical Exam  Constitutional: lying in bed, comforatble Eyes: No scleral injection HENT: No OP obstrucion Head: Normocephalic.  Cardiovascular: Normal rate and regular rhythm.  Respiratory: Effort normal and breath sounds normal to anterior ascultation GI: Soft.  No distension. There is no tenderness.  Skin: WDI  Neuro: Mental Status: Patient is awake,having dinner. She does not respond to verbal commands, aphasic PERRLA. Tongue midline.  Motor: RUE is contracted and spastic. Otherwise she moves all remaining extremities antigravity with good strength.  Sensory: Withdraws to pain bilaterally Deep Tendon Reflexes: 2+ and symmetric in the biceps and patella.No frontal release signs Plantars: Toes are downgoing bilaterally Cerebellar: Unable to assess  ASSESSMENT AND PLAN  Episode of altered awareness/Spells  D/D vasovagal syncope versus seizures  The description first event is highly suggestive of vasovagal syncope the patient had posturing following a bowel movement.However, she had another episode later today. Is unclear if this was when the patient was  in better understanding.  The patient does have advanced dementia and is high risk for having seizures.  Recommendations  Will obtain routine EEG If patient has more episodes that are not provoked by straining or standing, start Keppra 500 mg twice a day and continue. Will continue to follow    Georgiana SpinnerSushanth Aroor MD [facility] [phone]  If 7pm to 7am, please call on call as listed on AMION.

## 2017-05-01 NOTE — ED Notes (Signed)
Spoke with Noralee CharsPaula Townsend RN from [facility] who states that she is sending a nurse liaison to check on patient status.

## 2017-05-01 NOTE — Progress Notes (Signed)
Pt arrived onto unit. Daughter in law present at the bedside. Pt is nonverbal and doesn't follow commands.Daughter instructed to call for help when needed and verbalizes understanding.

## 2017-05-01 NOTE — ED Provider Notes (Signed)
[facility]-EMERGENCY DEPT Provider Note   CSN: [id number] Arrival date & time: 05/01/17  0224     History   Chief Complaint Chief Complaint  Patient presents with  . Altered Mental Status   LEVEL 5 CAVEAT DUE TO ALTERED MENTAL STATUS  HPI Janet Gutierrez is a 71 y.o. female.  The history is provided by a relative and a caregiver. The history is limited by the condition of the patient.  Altered Mental Status   This is a new problem. The current episode started 1 to 2 hours ago. The problem has not changed since onset.Associated symptoms include confusion and seizures. Her past medical history is significant for CVA.  PT WITH H/O DEMENTIA, H/O STROKE PRESENTS WITH "STIFFENING UP" AND POSSIBLE SEIZURE SON REPORTS PT BEGAN TO "STIFFEN UP" AND WAS CONFUSED AFTERWARDS AND UNRESPONSIVE BUT ALWAYS REMAINED WITH PULSE AND SPONTANEOUS BREATHING THIS HAS NEVER HAPPENED BEFORE NO H/O SEIZURES NO RECENT FEVER NO FALLS NO NEW MEDS NO FEVER/VOMITING/COUGH   Past Medical History:  Diagnosis Date  . CVA (cerebral vascular accident) (HCC)    At 71 y/o with right side hemiparesis, now largely resolved  . Dementia     Patient Active Problem List   Diagnosis Date Noted  . Alzheimer's disease 11/30/2013    Past Surgical History:  Procedure Laterality Date  . BRAIN SURGERY     age 71    OB History    No data available       Home Medications    Prior to Admission medications   Medication Sig Start Date End Date Taking? Authorizing Provider  memantine (NAMENDA) 5 MG tablet Start Namenda (memantine) 5mg  tablets.  Take 1 tablet at bedtime for 7 days, then 1 tablet twice daily for 7 days, then 1 tablet in morning and 2 tablets at bedtime for 7 days, then 2 tablets twice daily.   Side effects include dizziness, headache, diarrhea or constipation.  Call with any questions or concerns. 11/30/13   Drema DallasJaffe, Adam R, DO    Family History History reviewed. No pertinent family history.  Social  History Social History  Substance Use Topics  . Smoking status: Former Smoker    Quit date: 08/29/2009  . Smokeless tobacco: Never Used  . Alcohol use No     Allergies   Patient has no known allergies.   Review of Systems Review of Systems  Unable to perform ROS: Mental status change  Neurological: Positive for seizures.  Psychiatric/Behavioral: Positive for confusion.     Physical Exam Updated Vital Signs BP (!) 114/57 (BP Location: Left Arm)   Pulse 62   Temp 97.6 F (36.4 C) (Axillary)   Resp 12   Ht 1.651 m (5\' 5" )   Wt 61.2 kg (135 lb)   SpO2 99%   BMI 22.47 kg/m   Physical Exam CONSTITUTIONAL: elderly, frail, resting with eyes closed HEAD: Normocephalic/atraumatic EYES: forcibly keeps eyes closed ENMT: Mucous membranes moist NECK: supple no meningeal signs CV: S1/S2 noted, no murmurs/rubs/gallops noted LUNGS: Lungs are clear to auscultation bilaterally, no apparent distress ABDOMEN: soft, nontender  GU:no cva tenderness NEURO: Pt is sleeping.  She will intermittently arouse then go back to sleep.  She moves all extremities.  She does not follow commands EXTREMITIES: pulses normal/equal, full ROM SKIN: warm, color normal PSYCH: unable to assess   ED Treatments / Results  Labs (all labs ordered are listed, but only abnormal results are displayed) Labs Reviewed  CBC WITH DIFFERENTIAL/PLATELET - Abnormal; Notable for the  following:       Result Value   RBC 3.60 (*)    Hemoglobin 11.0 (*)    HCT 32.7 (*)    All other components within normal limits  RAPID URINE DRUG SCREEN, HOSP PERFORMED  URINALYSIS, ROUTINE W REFLEX MICROSCOPIC  BASIC METABOLIC PANEL  AMMONIA  ETHANOL    EKG  EKG Interpretation  Date/Time:  Friday May 01 2017 02:36:11 EDT Ventricular Rate:  64 PR Interval:    QRS Duration: 88 QT Interval:  431 QTC Calculation: 445 R Axis:   49 Text Interpretation:  Sinus rhythm Low voltage, precordial leads No previous ECGs available  Interpretation limited secondary to artifact Confirmed by Zadie RhineWickline, Aggie Douse ([id number]) on 05/01/2017 2:54:37 AM       Radiology No results found.  Procedures Procedures (including critical care time)  Medications Ordered in ED Medications - No data to display   Initial Impression / Assessment and Plan / ED Course  I have reviewed the triage vital signs and the nursing notes.  Pertinent labs & imaging results that were available during my care of the patient were reviewed by me and considered in my medical decision making (see chart for details).     4:23 AM Pt with h/o dementia/stroke presents with AMS and possible seizure activity Pt stable at this time Workup pending at this time  6:51 AM Workup unremarkable Pt continues to rest, but not fully awake and not following commands Son reports this is a change from her baseline She has known h/o dementia and distant h/o stroke but apparently is more alert per family at baseline, so this represents a change This could represent new onset seizure with prolonged post ictal phase Low suspicion for infectious etiology  Will admit Patient is managed by [facility] Will admit to internal medicine service  Final Clinical Impressions(s) / ED Diagnoses   Final diagnoses:  Delirium    New Prescriptions New Prescriptions   No medications on file     Zadie RhineWickline, Chasitty Hehl, MD 05/01/17 [phone]

## 2017-05-01 NOTE — Progress Notes (Signed)
EEG completed; results pending.    

## 2017-05-01 NOTE — Procedures (Signed)
Date of recording 05/01/2017  Referring physician Jess BartersLawrence Kilma  Reason for the study 71 year old female with history of dementia and prior history of stroke presented with the altered mental status.  Technical Digital EEG recording using 10-20 international electrode system  Description of the recording Posterior down and rhythm is 5-6 Hz reactive EEG comprises of generalized delta and theta slowing with intermittent triphasic waves Epileptiform features were not seen during this recording.  Impression The EEG is abnormal and findings are consistent with the generalized cerebral dysfunction with certain findings suggestive of metabolic encephalopathy. Epileptiform features were not seen during this recording.

## 2017-05-01 NOTE — Evaluation (Signed)
Clinical/Bedside Swallow Evaluation Patient Details  Name: Janet Gutierrez MRN: [id number] Date of Birth: May 24, 1946  Today's Date: 05/01/2017 Time: SLP Start Time (ACUTE ONLY): 1135 SLP Stop Time (ACUTE ONLY): 1152 SLP Time Calculation (min) (ACUTE ONLY): 17 min  Past Medical History:  Past Medical History:  Diagnosis Date  . CVA (cerebral vascular accident) (HCC)    At 71 y/o with right side hemiparesis, now largely resolved  . Dementia    Past Surgical History:  Past Surgical History:  Procedure Laterality Date  . 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 No acute changes noted on CT Head. Per MD note, there is concern for seizure.    Assessment / Plan / Recommendation Clinical Impression  Pt keeps her eyes closed but actively swats at therapist during repositioning and opens her mouth consistently for PO intake. She does have some reduced bolus awareness for acceptance, likely due to her current mentation and that she does not open her eyes. She had no overt s/s of aspiration with consistencies tested. Pt's daughter-in-law says that at home she consumes soft, chopped foods to facilitate mastication, but I would prefer to start her on a Dys 1 diet and thin liquids given her altered mentation. She should be fully alert and accepting for any PO intake. Will continue to follow for tolerance and hopeful advancement as mentation clears. SLP Visit Diagnosis: Dysphagia, oral phase (R13.11)    Aspiration Risk  Mild aspiration risk    Diet Recommendation Dysphagia 1 (Puree);Thin liquid   Liquid Administration via: Straw Medication Administration: Crushed with puree Supervision: Staff to assist with self feeding;Full supervision/cueing for compensatory strategies Compensations: Minimize environmental distractions;Slow rate;Small sips/bites Postural Changes: Seated  upright at 90 degrees;Remain upright for at least 30 minutes after po intake    Other  Recommendations Oral Care Recommendations: Oral care BID   Follow up Recommendations 24 hour supervision/assistance      Frequency and Duration min 2x/week  2 weeks       Prognosis Prognosis for Safe Diet Advancement: Good Barriers to Reach Goals: Cognitive deficits      Swallow Study   General HPI: [name] a 71 year old female with PMH of Advanced Cognitive Impairment and Hemorrhagic Stroke (Age 71) who presents with AMS. No acute changes noted on CT Head. Per MD note, there is concern for seizure.  Type of Study: Bedside Swallow Evaluation Previous Swallow Assessment: none in chart Diet Prior to this Study: NPO Temperature Spikes Noted: No Respiratory Status: Room air History of Recent Intubation: No Behavior/Cognition: Alert;Cooperative;Requires cueing Oral Cavity Assessment: Other (comment) (difficult to visualize) Oral Care Completed by SLP: No Oral Cavity - Dentition: Missing dentition Vision:  (keeps eyes closed) Self-Feeding Abilities: Total assist Patient Positioning: Upright in bed Baseline Vocal Quality: Normal    Oral/Motor/Sensory Function     Ice Chips Ice chips: Within functional limits Presentation: Spoon   Thin Liquid Thin Liquid: Impaired Presentation: Spoon;Straw Oral Phase Impairments: Poor awareness of bolus    Nectar Thick Nectar Thick Liquid: Not tested   Honey Thick Honey Thick Liquid: Not tested   Puree Puree: Impaired Presentation: Spoon Oral Phase Impairments: Poor awareness of bolus   Solid   GO   Solid: Not tested    Functional Assessment Tool Used: skilled clinical judgment Functional Limitations: Swallowing Swallow Current Status (P2951(G8996): At least 40 percent  but less than 60 percent impaired, limited or restricted Swallow Goal Status [phone](G8997): At least 20 percent but less than 40 percent impaired, limited or restricted   Janet Gutierrez,  Janet Gutierrez 05/01/2017,1:53 PM  Janet Gutierrez, M.A. CCC-SLP [phone]

## 2017-05-01 NOTE — ED Notes (Signed)
Pt no cooperative for neuro assessment.

## 2017-05-01 NOTE — H&P (Signed)
Date: 05/01/2017               [name] MRN: [id number]  DOB: 08-03-1946 Age / Sex: 71 y.o., female   PCP: [facility]         Medical Service: Internal Medicine Teaching Service         Attending Physician: Dr. Doneen PoissonKlima, Lawrence, MD    First Contact: Dr. Anthonette LegatoHarden Pager: [phone]  Second Contact: Dr. Earlene PlaterWallace Pager: [phone]       After Hours (After 5p/  First Contact Pager: [phone]  weekends / holidays): Second Contact Pager: [phone]   Chief Complaint: Altered Mental Status  History of Present Illness:  Janet Gutierrez is a 71 year old female with PMH of Advanced Cognitive Impairment and Hemorrhagic Stroke (Age 71) who presents with altered mental status. Patient is unable to provide history herself which is obtained from Son and Daughter-In-Law as well as chart review.   Patient was in her usual state of health until early this morning (8/3). She lives with her son who has her on a regular routine to schedule bathroom breaks around 12 am to 1 am. He woke her, took her to the bathroom, and she had a complete normal bowel movement. When he went to help stand her up, she became stiff in her extremities and unable to stand on her own. She did not have any subsequent loss of bowel/bladder control (as she already voided per family), tremors/shakes, or fall/injury. Son says this episode of stiffening lasted about 30 seconds. Afterwards she became somnolent and confused. She did not experience loss of consciousness, lose pulse, or stop breathing on her own. This is the first known episode. She has no known history of seizures. She has not had any changes in medications, only taking occasional Omeprazole and multivitamin.  At her baseline, patient is able to walk on her own and speaks occasionally (sometimes difficult to understand per family). She is able to feed herself, but requires assistance bathing and dressing herself. Family state that she has not had decreased oral  intake. She has not complained about any chest pain, fevers, chills, abdominal pain, urinary problem, or anything out of the usual recently. Family has not noticed any skin sores or wounds. Patient has seen Neurology, Dr. Everlena CooperJaffe 3 years ago and at that time considered to have Alzheimer's dementia and started on Namenda, however she is no longer taking this.  In the ED, initial vitals were, BP 114/57, Pulse 62, Temp 97.6 F, RR 12, SpO2 99% on RA. Lab work including CBC, BMP, Ammonia, Ethanol, UDS, and UA were without significant abnormality or explanation of her symptoms. CT Head was obtained which showed right cerebellar encephalomalacia, progressive generalized cerebral atrophy, without acute intracranial process.  Meds:  Current Meds  Medication Sig  . Multiple Vitamin (MULTIVITAMIN WITH MINERALS) TABS tablet Take 1 tablet by mouth daily.  Marland Kitchen. omeprazole (PRILOSEC) 20 MG capsule Take 20 mg by mouth daily.     Allergies: Allergies as of 05/01/2017  . (No Known Allergies)   Past Medical History:  Diagnosis Date  . CVA (cerebral vascular accident) (HCC)    At 71 y/o with right side hemiparesis, now largely resolved  . Dementia     Family History: Mother with diabetes  Social History: Former smoker, no alcohol or illicit drug use  Review of Systems: A complete ROS was unable to be obtained from patient due to mental status.  Physical Exam: Blood pressure Marland Kitchen(!)  110/46, pulse (!) 57, temperature (!) 97.5 F (36.4 C), temperature source Oral, resp. rate 10, height 5\' 5"  (1.651 m), weight 135 lb (61.2 kg), SpO2 100 %.  Physical Exam limited due to patient cooperation Physical Exam  Constitutional: She appears well-nourished.  Somnolent, occasionally opens eyes  HENT:  Head: Normocephalic.  Poor dentition  Eyes: No scleral icterus.  Cardiovascular: Regular rhythm.   No murmur heard. Pulses:      Radial pulses are 2+ on the right side, and 2+ on the left side.       Dorsalis pedis  pulses are 2+ on the right side, and 2+ on the left side.  Borderline bradycardia  Pulmonary/Chest: Effort normal. No respiratory distress.  Clear to auscultation anteriorly  Abdominal: Soft. Bowel sounds are normal. She exhibits no distension. There is no tenderness.  Musculoskeletal: She exhibits no edema or tenderness.  Neurological:  Somnolent, occasionally arouses to physical stimulation, moves all extremities spontaneously. Will respond "yes" once or twice, otherwise non-verbal and not able to answer orientation questions. Not following commands.  Skin: Skin is warm. Capillary refill takes less than 2 seconds. She is not diaphoretic. No pallor.     EKG: personally reviewed my interpretation is NSR, Rate 60 bpm, low voltage, no acute ischemic changes  CXR: not obtained  Assessment & Plan by Problem: Principal Problem:   Altered mental status Active Problems:   Alzheimer's disease   Altered Mental Status with history of Dementia: Patient's episode of stiffness and current symptoms of somnolence and altered mental status may represent a post-ictal state. She does have advanced dementia which is likely contributing, however family states this is a significant change from her baseline. So far, there is no evidence of infection (UA normal, no leukocytosis, afebrile), medication side effect, illicit drug use, hypoglycemia, or acute intracranial process. Myocardial ischemia is also in the differential as patient may not be able to express chest pain. Will admit patient for observation on telemetry, check TSH, B12, Troponin, and EEG. She has previously seen Neurology several years ago and may need to re-establish care for management of her dementia. If EEG is consistent with seizure activity, she may benefit from antiepileptic medications as well. - Check TSH, B12, Troponin - NPO for now, SLP eval - EEG - D5-1/2 NS @ 75 mL/hr for 10 hours - Seizure precautions   Dispo: Admit patient to  Observation with expected length of stay less than 2 midnights.  Signed: Darreld McleanPatel, Jonquil Stubbe, MD 05/01/2017, 10:11 AM

## 2017-05-02 DIAGNOSIS — G309 Alzheimer's disease, unspecified: Secondary | ICD-10-CM | POA: Diagnosis present

## 2017-05-02 DIAGNOSIS — R404 Transient alteration of awareness: Secondary | ICD-10-CM

## 2017-05-02 DIAGNOSIS — F0280 Dementia in other diseases classified elsewhere without behavioral disturbance: Secondary | ICD-10-CM

## 2017-05-02 DIAGNOSIS — Z79899 Other long term (current) drug therapy: Secondary | ICD-10-CM | POA: Diagnosis not present

## 2017-05-02 DIAGNOSIS — Z87891 Personal history of nicotine dependence: Secondary | ICD-10-CM | POA: Diagnosis not present

## 2017-05-02 DIAGNOSIS — Z8673 Personal history of transient ischemic attack (TIA), and cerebral infarction without residual deficits: Secondary | ICD-10-CM | POA: Diagnosis not present

## 2017-05-02 DIAGNOSIS — R569 Unspecified convulsions: Secondary | ICD-10-CM | POA: Diagnosis present

## 2017-05-02 DIAGNOSIS — R410 Disorientation, unspecified: Secondary | ICD-10-CM | POA: Diagnosis present

## 2017-05-02 DIAGNOSIS — F0150 Vascular dementia without behavioral disturbance: Secondary | ICD-10-CM | POA: Diagnosis present

## 2017-05-02 DIAGNOSIS — R4182 Altered mental status, unspecified: Secondary | ICD-10-CM | POA: Diagnosis not present

## 2017-05-02 DIAGNOSIS — G319 Degenerative disease of nervous system, unspecified: Secondary | ICD-10-CM | POA: Diagnosis present

## 2017-05-02 DIAGNOSIS — G9389 Other specified disorders of brain: Secondary | ICD-10-CM | POA: Diagnosis present

## 2017-05-02 MED ORDER — PANTOPRAZOLE SODIUM 20 MG PO TBEC: 20.0000 mg | DELAYED_RELEASE_TABLET | Freq: Every day | ORAL | Status: DC

## 2017-05-02 MED ORDER — PANTOPRAZOLE SODIUM 40 MG PO TBEC: 40.0000 mg | DELAYED_RELEASE_TABLET | Freq: Every day | ORAL | Status: DC

## 2017-05-02 MED ORDER — PANTOPRAZOLE SODIUM 40 MG PO PACK
40.0000 mg | PACK | Freq: Every day | ORAL | Status: DC
  Administered 2017-05-02 – 2017-05-03 (×2): 40 mg via ORAL
  Filled 2017-05-02 (×2): qty 20

## 2017-05-02 NOTE — Progress Notes (Addendum)
From nursing note, It appears patient had another episode last evening. Pt remained in a stiff position, suspicous for seizure. She was started on Keppra.  While EEG is negative, however does not rule out seizure. However her episode is concerning for seizures, and I agree with continuing Keppra at initial dose of 500 mg BID.  Will sign off. Thanks for the consult

## 2017-05-02 NOTE — H&P (Signed)
Internal Medicine Attending Admission Note Date: 05/02/2017  Patient name: Janet Gutierrez Medical record number: [id number] Date of birth: November 07, 1945 Age: 71 y.o. Gender: female  I saw and evaluated the patient. I reviewed the resident's note and I agree with the resident's findings and plan as documented in the resident's note.  Chief Complaint(s): Loss of consciousness.  History - key components related to admission:  Janet Gutierrez is a 71 year old woman with a history of an hemorrhagic stroke at the age of 89 and subsequent advanced dementia who presents after an episode of a loss of consciousness. She was in her usual state of health until the morning of admission when she was taken to the bathroom by her son. She apparently urinated and suddenly tensed and lost consciousness. After this event she was lethargic. She was therefore brought to the emergency department and admitted to the internal medicine teaching service given the concern for seizures. After admission she had a another episode that was similar and followed by lethargy. She was loaded with Keppra.  When seen on rounds the morning following admission she was arousable. She would not open her eyes to command and actively resisted opening her eyes, but would spontaneously open her eyes. She did not verbalize any words during our examination. The daughter-in-law was in the room at the time of our assessment and she felt she was 80-85% back to normal for her.  Physical Exam - key components related to admission:  Vitals:   05/01/17 1549 05/01/17 2137 05/01/17 2157 05/02/17 0601  BP: (!) 112/46 110/61 (!) 106/52 (!) 117/50  Pulse: 80 (!) 117 61 (!) 59  Resp:  18  16  Temp: 98.4 F (36.9 C) 97.6 F (36.4 C)  97.7 F (36.5 C)  TempSrc: Axillary Oral  Oral  SpO2: 100% 93%  100%  Weight:      Height:       Gen.: Well-developed, well-nourished, chronically ill-appearing elderly woman lying comfortably in bed in no acute distress.  She had constant movement of her mouth somewhat reminiscent of a tardive dyskinesia. She was easily arousable and appeared alert but was nonverbal. She did not follow commands.  Lab results:  Basic Metabolic Panel:  Recent Labs  96/29/5206/12/15 0343  NA 140  K 4.0  CL 102  CO2 30  GLUCOSE 97  BUN 11  CREATININE 0.64  CALCIUM 9.7   CBC:  Recent Labs  05/01/17 0343  WBC 5.3  NEUTROABS 3.5  HGB 11.0*  HCT 32.7*  MCV 90.8  PLT 333   Cardiac Enzymes:  Recent Labs  05/01/17 1001  TROPONINI <0.03   Thyroid Function Tests:  Recent Labs  05/01/17 1001  TSH 1.324   Anemia Panel:  Recent Labs  05/01/17 1001  VITAMINB12 905   Urine Drug Screen:  Negative for opiates, cocaine, benzodiazepines, amphetamines, THC, and barbiturates.  Alcohol Level:  Recent Labs  05/01/17 0343  ETH <5   Urinalysis:  Clear, yellow, specific gravity 1.011, pH 7.0, negative protein, negative nitrite, negative leukocytes.  Imaging results:   CT head without contrast: Personally reviewed. Right cerebellar encephalomalacia, generalized cerebral atrophy, enlarged ventricles, no acute bleed.  Other results:  EKG: Personally reviewed. Normal sinus rhythm at 64 bpm, normal axis, normal intervals, no significant Q waves, no LVH by voltage, delayed R wave progression, no ST or T wave changes. No comparisons immediately available.  Assessment & Plan by Problem:  Janet Gutierrez is a 71 year old woman with a history of an hemorrhagic stroke  at the age of 71 and subsequent advanced dementia who presents after an episode of a loss of consciousness. This was followed by lethargy. She had another episode after admission that was similar and included stiffening followed by lethargy. Although her EEG did not pick up epileptiform activity her history over the last 2 episodes is most consistent with seizures. She has not had additional episodes since being loaded with Keppra.  1) Possible seizures: She will  be maintained on Keppra and observed over the next 24 hours to assure no further episodes. We are also hopeful she returns to near 100% of baseline and will have some verbalization.  2) Disposition: Her daughter-in-law is uncomfortable taking her home if not fully back to baseline. This is because her husband, the son, is out of town on a business trip until Monday morning. If she does return back to baseline we will ask the daughter-in-law to take [name] home tomorrow. If she is not quite at baseline we will delay discharge until Monday morning.

## 2017-05-02 NOTE — Progress Notes (Signed)
  Speech Language Pathology Treatment: Dysphagia  Patient Details Name: Janet Gutierrez MRN: [id number] DOB: 04-Jun-1946 Today's Date: 05/02/2017 Time: [id number] SLP Time Calculation (min) (ACUTE ONLY): 12 min  Assessment / Plan / Recommendation Clinical Impression  Pt is more interactive today, opening her eyes and verbalizing minimally. Her daughter-in-law says that she consumed almost her entire breakfast tray without difficulty and that she is almost back to her baseline. SLP provided advanced trials of soft solids with prolonged bolus formation given her minimal dentition but ultimately with good oral clearance as can be visualized (pt does not open her mouth to command). Recommend advancement to Dys 2 diet which seems most consistent with the diet she was consuming PTA. She will still need supervision/assistance for feeding and emphasis on upright positioning and small bites.Will continue to follow for tolerance.   HPI HPI: [name] a 71 year old female with PMH of Advanced Cognitive Impairment and Hemorrhagic Stroke (Age 71) who presents with AMS. No acute changes noted on CT Head. Per MD note, there is concern for seizure.       SLP Plan  Continue with current plan of care       Recommendations  Diet recommendations: Dysphagia 2 (fine chop);Thin liquid Liquids provided via: Straw Medication Administration: Crushed with puree Supervision: Staff to assist with self feeding;Full supervision/cueing for compensatory strategies Compensations: Minimize environmental distractions;Slow rate;Small sips/bites Postural Changes and/or Swallow Maneuvers: Seated upright 90 degrees;Upright 30-60 min after meal                Oral Care Recommendations: Oral care BID Follow up Recommendations: 24 hour supervision/assistance SLP Visit Diagnosis: Dysphagia, oral phase (R13.11) Plan: Continue with current plan of care       GO                Janet Gutierrez, Dary Dilauro 05/02/2017, 11:04  AM  Janet HamLaura Gutierrez, M.A. CCC-SLP [phone]

## 2017-05-02 NOTE — Progress Notes (Signed)
Internal Medicine Attending  Date: 05/02/2017  Patient name: Janet Gutierrez Medical record number: [id number] Date of birth: 1946/04/01 Age: 71 y.o. Gender: female  I saw and evaluated the patient. I reviewed the resident's note by Dr. Earlene PlaterWallace and I agree with the resident's findings and plans as documented in his progress note.  Please see my H&P dated 05/02/2017 for the specifics of my evaluation, assessment, and plan earlier in the day.

## 2017-05-02 NOTE — Discharge Summary (Signed)
Name: Janet Gutierrez MRN: [id number] DOB: 07/02/1946 71 y.o. PCP: Patient, No Pcp Per  Date of Admission: 05/01/2017  2:24 AM Date of Discharge: 05/04/2017 Attending Physician: Levert FeinsteinGranfortuna, James M, MD  Discharge Diagnosis: Principal Problem:   Altered mental status Active Problems:   Alzheimer's disease   Spell of altered consciousness   Seizures (HCC)    Discharge Medications: Allergies as of 05/04/2017   No Known Allergies     Medication List    TAKE these medications   levETIRAcetam 100 MG/ML solution Commonly known as:  KEPPRA Take 5 mLs (500 mg total) by mouth 2 (two) times daily.   multivitamin with minerals Tabs tablet Take 1 tablet by mouth daily.   omeprazole 20 MG capsule Commonly known as:  PRILOSEC Take 20 mg by mouth daily.       Disposition and follow-up:   [name] Good [facility].  At the hospital follow up visit please address:  1.  -Ensure patient is fully back to baseline with no further episodes of seizure like activity and is tolerating Keppra   2.  Labs / imaging needed at time of follow-up: None  3.  Pending labs/ test needing follow-up: None   Follow-up Appointments:   Hospital Course by problem list:   1. AMS with History of Dementia and Concern for New Onset Seizures: Pt presented with altered mental status following an episode of extremity stiffening. She had an additional episode in the hospital witnessed by nursing staff with upward gaze, extremity stiffening, no jerking movements. Routine EEG did not capture seizure activity and instead showed global decreased activity. She may be predisposed to seizure activity given history of CVA, craniotomy, dementia. She was loaded with Keppra and subsequently received IV doses. Neurology was consulted who agreed with management. She had no further events of potential seizure activity, no underlying signs of infection or  metabolic abnormalities to cause her seizure or as another etiology of altered mental status. She slowly improved toward her baseline. She was transitioned to PO Keppra to continue as an outpatient. She has assistance provided by family at home and will [facility].    Discharge Vitals:   BP (!) 113/50 (BP Location: Right Arm)   Pulse 81   Temp 98 F (36.7 C) (Axillary)   Resp 20   Ht 5\' 5"  (1.651 m)   Wt 134 lb 14.7 oz (61.2 kg)   SpO2 95%   BMI 22.45 kg/m   Pertinent Labs, Studies, and Procedures:  BMET    Component Value Date/Time   NA 140 05/01/2017 0343   K 4.0 05/01/2017 0343   CL 102 05/01/2017 0343   CO2 30 05/01/2017 0343   GLUCOSE 97 05/01/2017 0343   BUN 11 05/01/2017 0343   CREATININE 0.64 05/01/2017 0343   CALCIUM 9.7 05/01/2017 0343   GFRNONAA >60 05/01/2017 0343   GFRAA >60 05/01/2017 0343   CBC    Component Value Date/Time   WBC 5.3 05/01/2017 0343   RBC 3.60 (L) 05/01/2017 0343   HGB 11.0 (L) 05/01/2017 0343   HCT 32.7 (L) 05/01/2017 0343   PLT 333 05/01/2017 0343   MCV 90.8 05/01/2017 0343   MCH 30.6 05/01/2017 0343   MCHC 33.6 05/01/2017 0343   RDW 13.7 05/01/2017 0343   LYMPHSABS 1.4 05/01/2017 0343   MONOABS 0.4 05/01/2017 0343   EOSABS 0.0 05/01/2017 0343   BASOSABS 0.0 05/01/2017 0343  Discharge Instructions: Discharge Instructions    Diet - low sodium heart healthy    Complete by:  As directed    Discharge instructions    Complete by:  As directed    --Start taking Keppra 500 mg twice a day to prevent further seizures  --Continue to participate in the [facility] program and follow up with them to make sure she's still doing well   Increase activity slowly    Complete by:  As directed       Signed: Ginger CarneHarden, Karsyn Jamie, MD 05/04/2017, 12:52 PM   Pager: [phone]

## 2017-05-02 NOTE — Progress Notes (Signed)
   Subjective:  Patient seen and examined this morning.  Daughter-in-law at the bedside.  Reports she thinks patient is 80-85% back to normal this morning.  Has not noted any further seizure-like activity.  Reports unable to care for patient safely at home without assistance of her son who is away on business until Monday morning.  Objective:  Vital signs in last 24 hours: Vitals:   05/01/17 1549 05/01/17 2137 05/01/17 2157 05/02/17 0601  BP: (!) 112/46 110/61 (!) 106/52 (!) 117/50  Pulse: 80 (!) 117 61 (!) 59  Resp:  18  16  Temp: 98.4 F (36.9 C) 97.6 F (36.4 C)  97.7 F (36.5 C)  TempSrc: Axillary Oral  Oral  SpO2: 100% 93%  100%  Weight:      Height:       General: resting in bed, no acute distress. HEENT: NCAT, EOMI, no scleral icterus Pulm: normal effort, on room air Ext: warm and well perfused, no pedal edema Neuro: intermittently responding and following simple commands but not consistently.  Opens eyes to voice and withdraws from stimulus.  Rhythmic mastication movements.  Moving all extremities freely.  Assessment/Plan:  AMS with History of Dementia and Concern for New Onset Seizures:  Mental status appears to be improved almost back to her baseline per family report.  There has been no further seizure-like activity since yesterday upon being loaded and then started on Keppra BID.  EEG noted to be negative but does not rule out seizure.  Neurology has evaluated patient and this morning agrees with continuing Keppra at 500mg  BID.  Other work up for AMS has been negative thus far for infectious or metabolic causes and patient appears to be improving with supportive care.  She is at risk for seizure with her history of prior CVA and craniotomy. - Continue supportive care, dysphagia 1 diet per SLP recommendations - Continue Keppra 500mg  BID, continue to monitor her closely for further seizure-like activity in the case she needs titration of her medications - Seizure  precautions - Neurology follow up appreciated - DC fluids, telemetry - Continue neuro checks  FEN Fluids: none Electrolytes: stable Nutrition: Dysphagia 1 diet  DVT PPx: Enoxaparin 40mg  SQ daily  CODE: FULL  Dispo: Anticipated discharge in approximately 2 day(s).   Gwynn BurlyWallace, Patricie Geeslin, DO 05/02/2017, 10:36 AM Pager: [phone]

## 2017-05-03 MED ORDER — LEVETIRACETAM 100 MG/ML PO SOLN
500.0000 mg | Freq: Two times a day (BID) | ORAL | Status: DC
  Administered 2017-05-03 – 2017-05-04 (×2): 500 mg via ORAL
  Filled 2017-05-03 (×2): qty 5

## 2017-05-03 NOTE — Progress Notes (Signed)
   Subjective:  Patient seen and examined this morning.  Daughter-in-law at the bedside.  Reports she thinks patient is 95% back to normal this morning.  Has not noted any further seizure-like activity.  She is asking to have physical therapy work with patient to ensure that her strength and mobility will still enable her to transition back home.  She also reports [facility].  Patient expresses no complaints this morning.  Objective:  Vital signs in last 24 hours: Vitals:   05/02/17 0601 05/02/17 1400 05/02/17 2119 05/03/17 0532  BP: (!) 117/50 (!) 94/50 96/81 (!) 95/41  Pulse: (!) 59 67 60 (!) 56  Resp: 16 17 18 16   Temp: 97.7 F (36.5 C) 98.6 F (37 C) 97.7 F (36.5 C) 97.6 F (36.4 C)  TempSrc: Oral Oral Oral Oral  SpO2: 100% 98% 100% 100%  Weight:      Height:       General: resting in bed, no acute distress. HEENT: NCAT, EOMI, no scleral icterus Pulm: normal effort, on room air Ext: warm and well perfused, no pedal edema Neuro: she is more alert this morning with her eyes open and responding to her name.  Her responses are not coherent but this is about baseline per daughter in law.  She continues to have rhythmic movements of her mouth.  Her eyes are open.  She is moving all her extremities.  Assessment/Plan:  AMS with History of Dementia and Concern for New Onset Seizures:   Mental status continues to improve and patient more alert and interactive this morning.  No further seizure activity noted since the afternoon of admission and since beginning Keppra. - Continue supportive care, dysphagia diet per SLP recommendations - Continue Keppra 500mg  BID, continue to monitor her closely for further seizure-like activity in the case she needs titration of her medications - Seizure precautions - PT evaluation and treat  FEN Fluids: none Electrolytes: stable Nutrition: Dysphagia 2 diet  DVT PPx: Enoxaparin 40mg  SQ daily  CODE:  FULL  Dispo: Anticipated discharge in approximately 1 day(s).   Gwynn BurlyWallace, Debany Vantol, DO 05/03/2017, 9:01 AM Pager: [phone]

## 2017-05-03 NOTE — Evaluation (Signed)
Physical Therapy Evaluation Patient Details Name: Janet Gutierrez MRN: [id number] DOB: Dec 10, 1945 Today's Date: 05/03/2017   History of Present Illness  Patient is a 71 yo female admitted 05/01/17 after seizure-like activity at home, and second episode in hospital.  PMH:  hemorrhagic stroke at age 89, Dementia  Clinical Impression  Patient presents with problems listed below. Will benefit from acute PT to maximize functional mobility prior to discharge.  Family wants patient to return home, hopefully able to ambulate.  Will continue acute PT.  Recommend patient return to [facility] at d/c.    Follow Up Recommendations Supervision/Assistance - 24 hour;Other (comment) (Continue [facility] Program at d/c)    Equipment Recommendations  Other (comment) (TBD, poss RW)    Recommendations for Other Services       Precautions / Restrictions Precautions Precautions: Fall Restrictions Weight Bearing Restrictions: No      Mobility  Bed Mobility Overal bed mobility: Needs Assistance Bed Mobility: Supine to Sit;Sit to Supine     Supine to sit: Min assist Sit to supine: Max assist   General bed mobility comments: Assist required to initiate movements.  Required max assist to return to supine - patient resisting return to supine and became agitated.  Once supine, easily calmed.  Transfers Overall transfer level: Needs assistance Equipment used: 1 person hand held assist Transfers: Sit to/from Stand Sit to Stand: Min assist         General transfer comment: Required max verbal cueing to stand from bed.  Increased time to move to standing - patient preoccupied with telemetry sticker.  Once upright, required min assist for balance.  Attempted to have patient take steps.  Patient very hesitant, requiring physical and visual cues.  Patient then became incontinent of urine.  Mod assist to return to sitting after completed.  Cleaned patient and returned to supine.  Ambulation/Gait             General Gait Details: Unable to take steps-need to urinate.  Stairs            Wheelchair Mobility    Modified Rankin (Stroke Patients Only)       Balance Overall balance assessment: Needs assistance Sitting-balance support: No upper extremity supported;Feet supported Sitting balance-Leahy Scale: Fair     Standing balance support: Single extremity supported Standing balance-Leahy Scale: Poor                               Pertinent Vitals/Pain Pain Assessment: Faces Faces Pain Scale: Hurts a little bit Pain Location: Lt knee Pain Descriptors / Indicators: Grimacing (Rubbing knee) Pain Intervention(s): Monitored during session;Repositioned    Home Living Family/patient expects to be discharged to:: Private residence Living Arrangements: Children (son and daughter-in-law) Available Help at Discharge: Family;Available 24 hours/day Type of Home: House       Home Layout: Two level;Bed/bath upstairs Home Equipment: Wheelchair - manual;Tub bench      Prior Function Level of Independence: Needs assistance   Gait / Transfers Assistance Needed: Ambulates with no assistive device with min guard from family.  ADL's / Homemaking Assistance Needed: Patient able to feed herself.  Assist for bathing/dressing, meal prep.        Hand Dominance        Extremity/Trunk Assessment   Upper Extremity Assessment Upper Extremity Assessment: Overall WFL for tasks assessed    Lower Extremity Assessment Lower Extremity Assessment: Overall WFL for tasks assessed  Communication   Communication: Expressive difficulties  Cognition Arousal/Alertness: Awake/alert Behavior During Therapy: Impulsive;Restless Overall Cognitive Status: History of cognitive impairments - at baseline                                 General Comments: Daughter reports close to baseline.      General Comments      Exercises     Assessment/Plan    PT  Assessment Patient needs continued PT services  PT Problem List Decreased activity tolerance;Decreased balance;Decreased mobility;Decreased coordination;Decreased cognition;Decreased knowledge of use of DME;Decreased safety awareness;Pain       PT Treatment Interventions DME instruction;Gait training;Stair training;Functional mobility training;Therapeutic activities;Balance training;Therapeutic exercise;Neuromuscular re-education;Patient/family education    PT Goals (Current goals can be found in the Care Plan section)  Acute Rehab PT Goals Patient Stated Goal: Patient unable PT Goal Formulation: With family Time For Goal Achievement: 05/10/17 Potential to Achieve Goals: Fair    Frequency Min 3X/week   Barriers to discharge        Co-evaluation               AM-PAC PT "6 Clicks" Daily Activity  Outcome Measure Difficulty turning over in bed (including adjusting bedclothes, sheets and blankets)?: A Little Difficulty moving from lying on back to sitting on the side of the bed? : Total Difficulty sitting down on and standing up from a chair with arms (e.g., wheelchair, bedside commode, etc,.)?: Total Help needed moving to and from a bed to chair (including a wheelchair)?: A Little Help needed walking in hospital room?: A Lot Help needed climbing 3-5 steps with a railing? : A Lot 6 Click Score: 12    End of Session Equipment Utilized During Treatment: Gait belt Activity Tolerance: Patient limited by pain;Treatment limited secondary to agitation Patient left: in bed;with call bell/phone within reach;with bed alarm set;with family/visitor present   PT Visit Diagnosis: Unsteadiness on feet (R26.81);Other abnormalities of gait and mobility (R26.89);Other symptoms and signs involving the nervous system (R29.898);Pain Pain - Right/Left: Left Pain - part of body: Knee    Time: 1526-1601 PT Time Calculation (min) (ACUTE ONLY): 35 min   Charges:   PT Evaluation $PT Eval  Moderate Complexity: 1 Mod PT Treatments $Therapeutic Activity: 8-22 mins   PT G Codes:        Durenda HurtSusan H. Renaldo Gutierrez, PT, [facility] Acute Rehab Services Pager [phone]   Vena AustriaSusan H Chico Gutierrez 05/03/2017, 4:35 PM

## 2017-05-04 MED ORDER — LEVETIRACETAM 100 MG/ML PO SOLN: 500.0000 mg | Freq: Two times a day (BID) | ORAL | 12 refills | Status: DC

## 2017-05-04 MED ORDER — LEVETIRACETAM 500 MG PO TABS: 500.0000 mg | ORAL_TABLET | Freq: Two times a day (BID) | ORAL | 0 refills | Status: AC

## 2017-05-04 NOTE — Progress Notes (Signed)
Patient discharged to home via wheelchair around 1615 with daughter in law and son. Patient stable. Discharge instructions provided to family who verbalized understanding. Prescriptions given to daughter in law. IV was removed prior to shift.   Sherlon HandingElisa Victoria Henshaw, RN

## 2017-05-04 NOTE — Progress Notes (Signed)
   Subjective:  [name] is more responsive this morning and her daughter-in-law states she is fully back to her baseline. PT evaluated and recommended 24 hr assistance and continued enrollment [facility]. She has had no further episodes of seizure activity.   Objective:  Vital signs in last 24 hours: Vitals:   05/03/17 0532 05/03/17 1339 05/03/17 2058 05/04/17 0606  BP: (!) 95/41 (!) 96/45 (!) 73/50 (!) 113/50  Pulse: (!) 56 97 63 81  Resp: 16 17 20 20   Temp: 97.6 F (36.4 C) 97.8 F (36.6 C) 98.1 F (36.7 C) 98 F (36.7 C)  TempSrc: Oral Axillary Axillary Axillary  SpO2: 100% 92% 100% 95%  Weight:      Height:       General: resting in bed, no acute distress. HEENT: NCAT, EOMI, no scleral icterus Pulm: normal effort, on room air Ext: warm and well perfused, no pedal edema Neuro: Improved alertness and responsiveness. She appears to intermittently follow commands.  She continues to have rhythmic movements of her mouth.  Her eyes are open.  She is moving all her extremities.  Assessment/Plan:  AMS with History of Dementia and Concern for New Onset Seizures:   Mental status continues to improve and patient more alert and interactive this morning.  No further seizure activity noted since the afternoon of admission and since beginning Keppra. - Continue supportive care, dysphagia diet per SLP recommendations - Continue Keppra 500mg  BID, continue to monitor her closely for further seizure-like activity in the case she needs titration of her medications - Seizure precautions   FEN Fluids: none Electrolytes: stable Nutrition: Dysphagia 2 diet  DVT PPx: Enoxaparin 40mg  SQ daily  CODE: FULL  Dispo: Anticipated discharge today.   Ginger CarneHarden, Giovonnie Trettel, MD 05/04/2017, 8:24 AM Pager: [phone]

## 2017-05-27 ENCOUNTER — Ambulatory Visit
Admission: RE | Admit: 2017-05-27 | Discharge: 2017-05-27 | Disposition: A | Discharge status: Home / Self Care | Payer: Medicare (Managed Care) | Source: Ambulatory Visit | Attending: Nurse Practitioner | Admitting: Nurse Practitioner

## 2017-05-27 ENCOUNTER — Other Ambulatory Visit: Payer: Self-pay | Admitting: Nurse Practitioner

## 2017-05-27 DIAGNOSIS — R05 Cough: Secondary | ICD-10-CM

## 2017-05-27 DIAGNOSIS — R059 Cough, unspecified: Secondary | ICD-10-CM

## 2017-05-27 DIAGNOSIS — R0989 Other specified symptoms and signs involving the circulatory and respiratory systems: Secondary | ICD-10-CM

## 2020-03-22 ENCOUNTER — Other Ambulatory Visit: Payer: Self-pay | Admitting: Internal Medicine

## 2020-03-22 ENCOUNTER — Ambulatory Visit
Admission: RE | Admit: 2020-03-22 | Discharge: 2020-03-22 | Disposition: A | Discharge status: Home / Self Care | Payer: Medicare (Managed Care) | Source: Ambulatory Visit | Attending: Internal Medicine | Admitting: Internal Medicine

## 2020-03-22 DIAGNOSIS — R059 Cough, unspecified: Secondary | ICD-10-CM

## 2020-03-30 ENCOUNTER — Other Ambulatory Visit (HOSPITAL_COMMUNITY): Payer: Self-pay | Admitting: Internal Medicine

## 2020-03-30 ENCOUNTER — Other Ambulatory Visit: Payer: Self-pay | Admitting: Internal Medicine

## 2020-04-03 ENCOUNTER — Other Ambulatory Visit (HOSPITAL_COMMUNITY): Payer: Self-pay | Admitting: Internal Medicine

## 2020-04-03 ENCOUNTER — Other Ambulatory Visit: Payer: Self-pay | Admitting: Family

## 2020-04-03 ENCOUNTER — Other Ambulatory Visit (HOSPITAL_COMMUNITY): Payer: Self-pay

## 2020-04-03 DIAGNOSIS — R1310 Dysphagia, unspecified: Secondary | ICD-10-CM

## 2020-04-16 ENCOUNTER — Other Ambulatory Visit: Payer: Self-pay

## 2020-04-16 ENCOUNTER — Ambulatory Visit (HOSPITAL_COMMUNITY)
Admission: RE | Admit: 2020-04-16 | Discharge: 2020-04-16 | Disposition: A | Discharge status: Home / Self Care | Payer: Medicare (Managed Care) | Source: Ambulatory Visit | Attending: Internal Medicine | Admitting: Internal Medicine

## 2020-04-16 DIAGNOSIS — R1310 Dysphagia, unspecified: Secondary | ICD-10-CM | POA: Diagnosis present

## 2020-04-16 NOTE — Progress Notes (Addendum)
Modified Barium Swallow Progress Note  Patient Details  Name: Janet Gutierrez MRN: [id number] Date of Birth: [birth year]  Today's Date: 04/16/2020  Modified Barium Swallow completed.  Full report located under Chart Review in the Imaging Section.  Brief recommendations include the following:  Clinical Impression  Pt demonstrates a a primary oral dyspahgia with lingual pumping and thrusting mechanism with premature spillage of 100% of the bolus tot he pharynx prior to the swallow. Regardless of significant pooling in the pyriforms and rapid intake, there was no penetration or aspiration. Despite missing dentition, all solid textures were trialed given no information provided on pts baseline diet. Pt was able to orally manipulate and prepare a graham cracker with gums and also peaches with only mild oral residue, which cleared with a liquid wash. Suggest continuing thin liquids, a soft chopped diet could be considered if textures are consistently small and soft. Large chunks of meat, vegetables and fruit would not be appropriate. Esophageal sweep WNL.    Swallow Evaluation Recommendations       SLP Diet Recommendations: Dysphagia 2 (Fine chop) solids;Thin liquid;Dysphagia 3 (Mech soft) solids   Liquid Administration via: Straw   Medication Administration: Crushed with puree   Supervision: Staff to assist with self feeding   Compensations: Slow rate;Small sips/bites   Postural Changes: Remain semi-upright after after feeds/meals (Comment)            Janet Gutierrez, Riley Nearing 04/16/2020,12:01 PM

## 2020-10-13 ENCOUNTER — Inpatient Hospital Stay (HOSPITAL_COMMUNITY): Payer: Medicare (Managed Care)

## 2020-10-13 ENCOUNTER — Emergency Department (HOSPITAL_COMMUNITY): Payer: Medicare (Managed Care)

## 2020-10-13 ENCOUNTER — Inpatient Hospital Stay (HOSPITAL_COMMUNITY)
Admission: EM | Admit: 2020-10-13 | Discharge: 2020-11-03 | DRG: 871 | Disposition: A | Discharge status: Home Health | Payer: Medicare (Managed Care) | Source: Skilled Nursing Facility | Attending: Student in an Organized Health Care Education/Training Program | Admitting: Student in an Organized Health Care Education/Training Program

## 2020-10-13 ENCOUNTER — Encounter (HOSPITAL_COMMUNITY): Payer: Self-pay

## 2020-10-13 ENCOUNTER — Other Ambulatory Visit: Payer: Self-pay

## 2020-10-13 DIAGNOSIS — R579 Shock, unspecified: Secondary | ICD-10-CM | POA: Diagnosis not present

## 2020-10-13 DIAGNOSIS — R40241 Glasgow coma scale score 13-15, unspecified time: Secondary | ICD-10-CM | POA: Diagnosis not present

## 2020-10-13 DIAGNOSIS — Z79899 Other long term (current) drug therapy: Secondary | ICD-10-CM

## 2020-10-13 DIAGNOSIS — E870 Hyperosmolality and hypernatremia: Secondary | ICD-10-CM | POA: Diagnosis not present

## 2020-10-13 DIAGNOSIS — Z8616 Personal history of COVID-19: Secondary | ICD-10-CM

## 2020-10-13 DIAGNOSIS — I69351 Hemiplegia and hemiparesis following cerebral infarction affecting right dominant side: Secondary | ICD-10-CM | POA: Diagnosis not present

## 2020-10-13 DIAGNOSIS — I340 Nonrheumatic mitral (valve) insufficiency: Secondary | ICD-10-CM | POA: Diagnosis not present

## 2020-10-13 DIAGNOSIS — E43 Unspecified severe protein-calorie malnutrition: Secondary | ICD-10-CM | POA: Diagnosis not present

## 2020-10-13 DIAGNOSIS — J9601 Acute respiratory failure with hypoxia: Secondary | ICD-10-CM | POA: Diagnosis not present

## 2020-10-13 DIAGNOSIS — R6521 Severe sepsis with septic shock: Secondary | ICD-10-CM | POA: Diagnosis present

## 2020-10-13 DIAGNOSIS — Z515 Encounter for palliative care: Secondary | ICD-10-CM

## 2020-10-13 DIAGNOSIS — D509 Iron deficiency anemia, unspecified: Secondary | ICD-10-CM | POA: Diagnosis not present

## 2020-10-13 DIAGNOSIS — M7989 Other specified soft tissue disorders: Secondary | ICD-10-CM | POA: Diagnosis not present

## 2020-10-13 DIAGNOSIS — A419 Sepsis, unspecified organism: Principal | ICD-10-CM

## 2020-10-13 DIAGNOSIS — Z833 Family history of diabetes mellitus: Secondary | ICD-10-CM

## 2020-10-13 DIAGNOSIS — R4182 Altered mental status, unspecified: Secondary | ICD-10-CM | POA: Diagnosis present

## 2020-10-13 DIAGNOSIS — R627 Adult failure to thrive: Secondary | ICD-10-CM | POA: Diagnosis present

## 2020-10-13 DIAGNOSIS — Z7189 Other specified counseling: Secondary | ICD-10-CM | POA: Diagnosis not present

## 2020-10-13 DIAGNOSIS — T17800S Unspecified foreign body in other parts of respiratory tract causing asphyxiation, sequela: Secondary | ICD-10-CM | POA: Diagnosis not present

## 2020-10-13 DIAGNOSIS — T17800A Unspecified foreign body in other parts of respiratory tract causing asphyxiation, initial encounter: Secondary | ICD-10-CM | POA: Diagnosis not present

## 2020-10-13 DIAGNOSIS — N390 Urinary tract infection, site not specified: Secondary | ICD-10-CM | POA: Diagnosis present

## 2020-10-13 DIAGNOSIS — F0150 Vascular dementia without behavioral disturbance: Secondary | ICD-10-CM | POA: Diagnosis present

## 2020-10-13 DIAGNOSIS — M25461 Effusion, right knee: Secondary | ICD-10-CM | POA: Diagnosis present

## 2020-10-13 DIAGNOSIS — G309 Alzheimer's disease, unspecified: Secondary | ICD-10-CM

## 2020-10-13 DIAGNOSIS — D6489 Other specified anemias: Secondary | ICD-10-CM | POA: Diagnosis not present

## 2020-10-13 DIAGNOSIS — I313 Pericardial effusion (noninflammatory): Secondary | ICD-10-CM | POA: Diagnosis present

## 2020-10-13 DIAGNOSIS — G9340 Encephalopathy, unspecified: Secondary | ICD-10-CM

## 2020-10-13 DIAGNOSIS — R401 Stupor: Secondary | ICD-10-CM | POA: Diagnosis not present

## 2020-10-13 DIAGNOSIS — G40909 Epilepsy, unspecified, not intractable, without status epilepticus: Secondary | ICD-10-CM | POA: Diagnosis present

## 2020-10-13 DIAGNOSIS — R400 Somnolence: Secondary | ICD-10-CM | POA: Diagnosis not present

## 2020-10-13 DIAGNOSIS — G928 Other toxic encephalopathy: Secondary | ICD-10-CM | POA: Diagnosis not present

## 2020-10-13 DIAGNOSIS — B9562 Methicillin resistant Staphylococcus aureus infection as the cause of diseases classified elsewhere: Secondary | ICD-10-CM | POA: Diagnosis present

## 2020-10-13 DIAGNOSIS — R569 Unspecified convulsions: Secondary | ICD-10-CM | POA: Diagnosis not present

## 2020-10-13 DIAGNOSIS — R64 Cachexia: Secondary | ICD-10-CM | POA: Diagnosis not present

## 2020-10-13 DIAGNOSIS — I10 Essential (primary) hypertension: Secondary | ICD-10-CM | POA: Diagnosis present

## 2020-10-13 DIAGNOSIS — R6520 Severe sepsis without septic shock: Secondary | ICD-10-CM | POA: Diagnosis not present

## 2020-10-13 DIAGNOSIS — I361 Nonrheumatic tricuspid (valve) insufficiency: Secondary | ICD-10-CM | POA: Diagnosis not present

## 2020-10-13 DIAGNOSIS — J189 Pneumonia, unspecified organism: Secondary | ICD-10-CM

## 2020-10-13 DIAGNOSIS — J690 Pneumonitis due to inhalation of food and vomit: Secondary | ICD-10-CM | POA: Diagnosis present

## 2020-10-13 DIAGNOSIS — R54 Age-related physical debility: Secondary | ICD-10-CM | POA: Diagnosis present

## 2020-10-13 DIAGNOSIS — Z66 Do not resuscitate: Secondary | ICD-10-CM | POA: Diagnosis not present

## 2020-10-13 DIAGNOSIS — U071 COVID-19: Secondary | ICD-10-CM | POA: Diagnosis not present

## 2020-10-13 DIAGNOSIS — L89156 Pressure-induced deep tissue damage of sacral region: Secondary | ICD-10-CM | POA: Diagnosis not present

## 2020-10-13 DIAGNOSIS — R001 Bradycardia, unspecified: Secondary | ICD-10-CM | POA: Diagnosis not present

## 2020-10-13 DIAGNOSIS — F0280 Dementia in other diseases classified elsewhere without behavioral disturbance: Secondary | ICD-10-CM

## 2020-10-13 DIAGNOSIS — J90 Pleural effusion, not elsewhere classified: Secondary | ICD-10-CM | POA: Diagnosis present

## 2020-10-13 DIAGNOSIS — E861 Hypovolemia: Secondary | ICD-10-CM | POA: Diagnosis present

## 2020-10-13 DIAGNOSIS — Z4659 Encounter for fitting and adjustment of other gastrointestinal appliance and device: Secondary | ICD-10-CM

## 2020-10-13 DIAGNOSIS — Z6823 Body mass index (BMI) 23.0-23.9, adult: Secondary | ICD-10-CM | POA: Diagnosis not present

## 2020-10-13 DIAGNOSIS — R1310 Dysphagia, unspecified: Secondary | ICD-10-CM | POA: Diagnosis present

## 2020-10-13 DIAGNOSIS — Z87891 Personal history of nicotine dependence: Secondary | ICD-10-CM

## 2020-10-13 DIAGNOSIS — L8990 Pressure ulcer of unspecified site, unspecified stage: Secondary | ICD-10-CM | POA: Insufficient documentation

## 2020-10-13 DIAGNOSIS — I493 Ventricular premature depolarization: Secondary | ICD-10-CM | POA: Diagnosis not present

## 2020-10-13 DIAGNOSIS — L89611 Pressure ulcer of right heel, stage 1: Secondary | ICD-10-CM | POA: Diagnosis not present

## 2020-10-13 DIAGNOSIS — I339 Acute and subacute endocarditis, unspecified: Secondary | ICD-10-CM | POA: Diagnosis not present

## 2020-10-13 LAB — URINALYSIS, ROUTINE W REFLEX MICROSCOPIC
Bilirubin Urine: NEGATIVE
Glucose, UA: NEGATIVE mg/dL
Hgb urine dipstick: NEGATIVE
Ketones, ur: 15 mg/dL — AB
Nitrite: NEGATIVE
Protein, ur: NEGATIVE mg/dL
Specific Gravity, Urine: 1.01 (ref 1.005–1.030)
pH: 6 (ref 5.0–8.0)

## 2020-10-13 LAB — CBC WITH DIFFERENTIAL/PLATELET
Abs Immature Granulocytes: 0.22 10*3/uL — ABNORMAL HIGH (ref 0.00–0.07)
Basophils Absolute: 0.1 10*3/uL (ref 0.0–0.1)
Basophils Relative: 1 %
Eosinophils Absolute: 0 10*3/uL (ref 0.0–0.5)
Eosinophils Relative: 0 %
HCT: 33.8 % — ABNORMAL LOW (ref 36.0–46.0)
Hemoglobin: 11.5 g/dL — ABNORMAL LOW (ref 12.0–15.0)
Immature Granulocytes: 1 %
Lymphocytes Relative: 2 %
Lymphs Abs: 0.4 10*3/uL — ABNORMAL LOW (ref 0.7–4.0)
MCH: 33.3 pg (ref 26.0–34.0)
MCHC: 34 g/dL (ref 30.0–36.0)
MCV: 98 fL (ref 80.0–100.0)
Monocytes Absolute: 1.2 10*3/uL — ABNORMAL HIGH (ref 0.1–1.0)
Monocytes Relative: 8 %
Neutro Abs: 13.8 10*3/uL — ABNORMAL HIGH (ref 1.7–7.7)
Neutrophils Relative %: 88 %
Platelets: 454 10*3/uL — ABNORMAL HIGH (ref 150–400)
RBC: 3.45 MIL/uL — ABNORMAL LOW (ref 3.87–5.11)
RDW: 15.1 % (ref 11.5–15.5)
WBC: 15.7 10*3/uL — ABNORMAL HIGH (ref 4.0–10.5)
nRBC: 0.5 % — ABNORMAL HIGH (ref 0.0–0.2)

## 2020-10-13 LAB — COMPREHENSIVE METABOLIC PANEL
ALT: 42 U/L (ref 0–44)
AST: 42 U/L — ABNORMAL HIGH (ref 15–41)
Albumin: 2.2 g/dL — ABNORMAL LOW (ref 3.5–5.0)
Alkaline Phosphatase: 138 U/L — ABNORMAL HIGH (ref 38–126)
Anion gap: 16 — ABNORMAL HIGH (ref 5–15)
BUN: 28 mg/dL — ABNORMAL HIGH (ref 8–23)
CO2: 26 mmol/L (ref 22–32)
Calcium: 8.2 mg/dL — ABNORMAL LOW (ref 8.9–10.3)
Chloride: 102 mmol/L (ref 98–111)
Creatinine, Ser: 0.94 mg/dL (ref 0.44–1.00)
GFR, Estimated: >60 mL/min (ref >60)
Glucose, Bld: 85 mg/dL (ref 70–99)
Potassium: 3.5 mmol/L (ref 3.5–5.1)
Sodium: 144 mmol/L (ref 135–145)
Total Bilirubin: 0.5 mg/dL (ref 0.3–1.2)
Total Protein: 6.7 g/dL (ref 6.5–8.1)

## 2020-10-13 LAB — I-STAT ARTERIAL BLOOD GAS, ED
Acid-Base Excess: 6 mmol/L — ABNORMAL HIGH (ref 0.0–2.0)
Bicarbonate: 29.7 mmol/L — ABNORMAL HIGH (ref 20.0–28.0)
Calcium, Ion: 1.09 mmol/L — ABNORMAL LOW (ref 1.15–1.40)
HCT: 28 % — ABNORMAL LOW (ref 36.0–46.0)
Hemoglobin: 9.5 g/dL — ABNORMAL LOW (ref 12.0–15.0)
O2 Saturation: 94 %
Potassium: 3.2 mmol/L — ABNORMAL LOW (ref 3.5–5.1)
Sodium: 145 mmol/L (ref 135–145)
TCO2: 31 mmol/L (ref 22–32)
pCO2 arterial: 38.4 mmHg (ref 32.0–48.0)
pH, Arterial: 7.496 — ABNORMAL HIGH (ref 7.350–7.450)
pO2, Arterial: 66 mmHg — ABNORMAL LOW (ref 83.0–108.0)

## 2020-10-13 LAB — PROTIME-INR
INR: 1.4 — ABNORMAL HIGH (ref 0.8–1.2)
Prothrombin Time: 16.4 seconds — ABNORMAL HIGH (ref 11.4–15.2)

## 2020-10-13 LAB — C-REACTIVE PROTEIN: CRP: 24.7 mg/dL — ABNORMAL HIGH (ref <1.0)

## 2020-10-13 LAB — URINALYSIS, MICROSCOPIC (REFLEX)

## 2020-10-13 LAB — LACTATE DEHYDROGENASE: LDH: 221 U/L — ABNORMAL HIGH (ref 98–192)

## 2020-10-13 LAB — FERRITIN: Ferritin: 795 ng/mL — ABNORMAL HIGH (ref 11–307)

## 2020-10-13 LAB — POC SARS CORONAVIRUS 2 AG -  ED: SARS Coronavirus 2 Ag: POSITIVE — AB

## 2020-10-13 LAB — TRIGLYCERIDES: Triglycerides: 55 mg/dL (ref <150)

## 2020-10-13 LAB — D-DIMER, QUANTITATIVE: D-Dimer, Quant: 8.87 ug/mL-FEU — ABNORMAL HIGH (ref 0.00–0.50)

## 2020-10-13 LAB — LACTIC ACID, PLASMA
Lactic Acid, Venous: 1.3 mmol/L (ref 0.5–1.9)
Lactic Acid, Venous: 1.4 mmol/L (ref 0.5–1.9)

## 2020-10-13 LAB — PROCALCITONIN: Procalcitonin: 2.32 ng/mL

## 2020-10-13 LAB — FIBRINOGEN: Fibrinogen: >800 mg/dL — ABNORMAL HIGH (ref 210–475)

## 2020-10-13 LAB — APTT: aPTT: 40 seconds — ABNORMAL HIGH (ref 24–36)

## 2020-10-13 MED ORDER — LACTATED RINGERS IV BOLUS (SEPSIS)
1000.0000 mL | Freq: Once | INTRAVENOUS | Status: AC
  Administered 2020-10-13: 1000 mL via INTRAVENOUS

## 2020-10-13 MED ORDER — ACETAMINOPHEN 650 MG RE SUPP
650.0000 mg | Freq: Once | RECTAL | Status: AC
  Administered 2020-10-13: 650 mg via RECTAL

## 2020-10-13 MED ORDER — SODIUM CHLORIDE 0.9 % IV BOLUS
500.0000 mL | Freq: Once | INTRAVENOUS | Status: AC
  Administered 2020-10-13: 500 mL via INTRAVENOUS

## 2020-10-13 MED ORDER — LACTATED RINGERS IV BOLUS
1000.0000 mL | Freq: Once | INTRAVENOUS | Status: AC
  Administered 2020-10-13: 1000 mL via INTRAVENOUS

## 2020-10-13 MED ORDER — VANCOMYCIN HCL 1250 MG/250ML IV SOLN
1250.0000 mg | Freq: Once | INTRAVENOUS | Status: AC
  Administered 2020-10-13: 1250 mg via INTRAVENOUS
  Filled 2020-10-13: qty 250

## 2020-10-13 MED ORDER — SODIUM CHLORIDE 0.9 % IV SOLN: 100.0000 mg | Freq: Every day | INTRAVENOUS | Status: DC

## 2020-10-13 MED ORDER — IOHEXOL 300 MG/ML  SOLN
73.0000 mL | Freq: Once | INTRAMUSCULAR | Status: AC | PRN
  Administered 2020-10-13: 73 mL via INTRAVENOUS

## 2020-10-13 MED ORDER — SODIUM CHLORIDE 0.9 % IV SOLN
200.0000 mg | Freq: Once | INTRAVENOUS | Status: DC
  Filled 2020-10-13: qty 40

## 2020-10-13 MED ORDER — VANCOMYCIN HCL IN DEXTROSE 1-5 GM/200ML-% IV SOLN
1000.0000 mg | INTRAVENOUS | Status: DC
  Administered 2020-10-14 – 2020-10-15 (×2): 1000 mg via INTRAVENOUS
  Filled 2020-10-13 (×3): qty 200

## 2020-10-13 MED ORDER — SODIUM CHLORIDE 0.9 % IV SOLN
2.0000 g | Freq: Once | INTRAVENOUS | Status: AC
  Administered 2020-10-13: 2 g via INTRAVENOUS
  Filled 2020-10-13: qty 2

## 2020-10-13 MED ORDER — LEVETIRACETAM IN NACL 500 MG/100ML IV SOLN
500.0000 mg | Freq: Two times a day (BID) | INTRAVENOUS | Status: DC
  Administered 2020-10-14 (×2): 500 mg via INTRAVENOUS
  Filled 2020-10-13 (×4): qty 100

## 2020-10-13 MED ORDER — NOREPINEPHRINE 4 MG/250ML-% IV SOLN
0.0000 ug/min | INTRAVENOUS | Status: DC
  Filled 2020-10-13: qty 250

## 2020-10-13 MED ORDER — SODIUM CHLORIDE 0.9 % IV SOLN
2.0000 g | Freq: Two times a day (BID) | INTRAVENOUS | Status: DC
  Administered 2020-10-14: 2 g via INTRAVENOUS
  Filled 2020-10-13: qty 2

## 2020-10-13 MED ORDER — LACTATED RINGERS IV SOLN: INTRAVENOUS | Status: AC

## 2020-10-13 MED ORDER — DEXAMETHASONE SODIUM PHOSPHATE 10 MG/ML IJ SOLN
6.0000 mg | INTRAMUSCULAR | Status: DC
  Administered 2020-10-13: 6 mg via INTRAVENOUS
  Filled 2020-10-13: qty 1

## 2020-10-13 MED ORDER — ENOXAPARIN SODIUM 40 MG/0.4ML ~~LOC~~ SOLN
40.0000 mg | SUBCUTANEOUS | Status: DC
  Administered 2020-10-14 – 2020-11-03 (×21): 40 mg via SUBCUTANEOUS
  Filled 2020-10-13 (×21): qty 0.4

## 2020-10-13 MED ORDER — LEVETIRACETAM IN NACL 500 MG/100ML IV SOLN
500.0000 mg | Freq: Once | INTRAVENOUS | Status: AC
  Administered 2020-10-13: 500 mg via INTRAVENOUS
  Filled 2020-10-13: qty 100

## 2020-10-13 MED ORDER — VANCOMYCIN HCL IN DEXTROSE 1-5 GM/200ML-% IV SOLN: 1000.0000 mg | Freq: Once | INTRAVENOUS | Status: DC

## 2020-10-13 NOTE — Sepsis Progress Note (Signed)
[facility] is following this sepsis. 

## 2020-10-13 NOTE — Progress Notes (Signed)
Pharmacy Antibiotic Note  Janet Gutierrez is a 75 y.o. female admitted on 10/13/2020 with sepsis.  Pharmacy has been consulted for Cefepime and Vancomycin dosing.   Height: 5\' 5"  (165.1 cm) Weight: 61.2 kg (134 lb 14.7 oz) IBW/kg (Calculated) : 57  Temp (24hrs), Avg:101.3 F (38.5 C), Min:98.1 F (36.7 C), Max:102.9 F (39.4 C)  Recent Labs  Lab 10/13/20 1350 10/13/20 1628  WBC 15.7*  --   CREATININE 0.94  --   LATICACIDVEN 1.3 1.4    Estimated Creatinine Clearance: 48.7 mL/min (by C-G formula based on SCr of 0.94 mg/dL).    No Known Allergies  Antimicrobials this admission: 1/15 Cefepime >>  1/15 Vancomycin >>   Dose adjustments this admission: N/a  Microbiology results: Pending   Plan:  - Cefepime 2g IV q12h - Vancomycin 1250mg  IV x 1 dose  - Followed by Vancomycin 1000mg  IV q24h - Est Calc AUC 506 - Monitor patients renal function and urine output  - De-escalate ABX when appropriate   Thank you for allowing pharmacy to be a part of this patient's care.  2/15 PharmD. BCPS 10/13/2020 5:06 PM

## 2020-10-13 NOTE — Procedures (Signed)
Patient Name: Janet Gutierrez  MRN: [id number]  Epilepsy Attending: Charlsie Quest  Referring Physician/Provider: Dr Orland Mustard Date: 10/13/2020 Duration: 25.36 mins  Patient history: 74yo F with AMS. EEG to evaluate for seizure.  Level of alertness: Awake  AEDs during EEG study: LEV  Technical aspects: This EEG study was done with scalp electrodes positioned according to the 10-20 International system of electrode placement. Electrical activity was acquired at a sampling rate of 500Hz  and reviewed with a high frequency filter of 70Hz  and a low frequency filter of 1Hz . EEG data were recorded continuously and digitally stored.   Description: No posterior dominant rhythm was seen. 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 There is also continuous generalized 3 to 6 Hz theta-delta slowing. Hyperventilation and photic stimulation were not performed.     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 The periodic epileptiform discharges are at 1Hz  and at times rhythmic which is on the ictal-interictal continuum with high potential for seizure. There is also moderate diffuse encephalopathy, non specific etiology. No seizures were seen throughout the recording.  Ilia Engelbert 

## 2020-10-13 NOTE — Consult Note (Signed)
NAME:  Janet Gutierrez, MRN:  [id number], DOB:  [birth year], LOS: 0 ADMISSION DATE:  10/13/2020, CONSULTATION DATE:  10/13/2020 REFERRING MD:  Dr. Ronnald Nian, CHIEF COMPLAINT:  AMS/ hypotension  Brief History:  75 year old female w/ hx of vascular dementia, seizures, known COVID positive sent from assisted living with AMS, difficulty swallowing, hypoxia, and fever.  Found to have pneumonia and possible UTI, ongoing hypotension in ER despite IVFs.  PCCM consulted for ICU admit.   History of Present Illness:  HPI obtained from medical chart review as patient is encephalopathic at baseline.  Unable to reach son by phone.     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  She is also COVID positive, tested positive 12/29.    She reportedly lives with her son at home, who is her primary caregiver in the evening and weekends and goes [facility] during the day.  Reported she needs full assistance for her ADLs.  Currently, son is out of town in [place] on vacation.  In ER, patient febrile, tachycardic, hypotensive in the 70's, and requiring 2L [place].  Her GCS has been around 7, unclear what her true baseline is but thought to be near baseline and non- verbal.  Labs noted for BUN 28, AG 16, alk phos 138, albumin 2.2, AST 42, LDH 221, PCT 2.32, WBC 15.7, Hgb 11.5, HCT 33.8, INR 1.4, COVID positive, CXR showing left lower infiltrate and pleural effusion, UA with trace leukocytes.  Long without acute findings. Received 3L fluids in ER with some improvement in blood pressure but MAP remains marginal.  Cultures sent and started on cefepime and vancomycin.  Mental status has improved mildly in ER, but still not following commands or verbal.  EDP was able to talk with son previously who verified he wants everything done for this mother.   PCCM called for possible ICU  admit.   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 No acute findings. 2. Mild atrophic change and chronic ischemic microvascular disease. 3. Stable right occipital craniectomy with underlying encephalomalacia over the right cerebellar hemisphere. 4. Minimal chronic sinus inflammatory change.  Micro Data:  1/15 SARS >> positive  1/15 BCx 2 >> 1/15 UC >>  Antimicrobials:  1/15 cefepime >> 1/15 vancomycin >> 1/15 MRSA  Interim History / Subjective:    Objective   Blood pressure (!) 94/58, pulse 88, temperature (!) 102.9 F (39.4 C), temperature source Rectal, resp. rate 20, height 5' 5"  (1.651 m), weight 61.2 kg, SpO2 97 %.        Intake/Output Summary (Last 24 hours) at 10/13/2020 1555 Last data filed at 10/13/2020 1527 Gross per 24 hour  Intake 1200 ml  Output --  Net 1200 ml   Filed Weights   10/13/20 1322  Weight: 61.2 kg   Examination: General:  Chronically ill, cachetic elderly female in no distress on stretcher, prefers laying on her left HEENT: MM pink/extremely dry, chapped lips Neuro: Will open eyes and move extremities spont but doesn't f/c.  Blinks to threat, +cough and gag, at times she ?has staring episodes  CV: rr, no mumur, +2 radial pulses PULM:  Non labored, on 3L [place], strong  wet cough- not very productive, scattered rhonchi, diminished in left base GI: soft, bs active  Extremities: warm/dry, no LE edema  Skin: no rashes, sacrum not visualized  Resolved Hospital Problem list    Assessment & Plan:   LLL PNA, likely aspiration  Sepsis, nearing shock secondary to pneumonia, possible UTI Encephalopathy, secondary to sepsis, CTH neg, r/o seizures COVID positive - CXR not c/w viral pneumonia, originally tested positive 09/24/2020 Vascular dementia Hx CVA- CTH neg for  acute changes P:  Continue supplemental O2 for sat goal > 92% NPO; Aspiration precautions; SLP when mental status improves  Intubation risk given mental status Check ABG now  Additional 1L LR- still very dry, if still hypotensive will admit to ICU for vasopressor support; normal lactate reassuring  Follow culture data Continue vanc/ cefepime  Trend WBC/ fever curve At risk for AKI - trend renal indices/ UOP Spot EEG to r/o seizure with AMS Change home Keppra PO to IV while NPO Trend neuro checks Needs PMT consult for GOG given vascular dementia, ? End stage if aspirating, for now remains full code but would advise against intubation or CPR given history Defer isolation given original COVID + date 12/27  PCCM will reassess after ABG and additional fluids to see if patient meets ICU criteria   Best practice (evaluated daily)  Diet: NPO Pain/Anxiety/Delirium protocol (if indicated): n/a VAP protocol (if indicated): n/a DVT prophylaxis: heparin SQ/ SCDs GI prophylaxis: n/a Glucose control: trend on BMET Mobility: advance as able; will need PT Disposition: pending.  Goals of Care:  Last date of multidisciplinary goals of care discussion: pending Family and staff present: n/a Summary of discussion: n/a Follow up goals of care discussion due: pending Code Status: full.  Unable to reach son by phone.    Labs   CBC: Recent Labs  Lab 10/13/20 1350  WBC 15.7*  NEUTROABS 13.8*  HGB 11.5*  HCT 33.8*  MCV 98.0  PLT 454*    Basic Metabolic Panel: Recent Labs  Lab 10/13/20 1350  NA 144  K 3.5  CL 102  CO2 26  GLUCOSE 85  BUN 28*  CREATININE 0.94  CALCIUM 8.2*   GFR: Estimated Creatinine Clearance: 48.7 mL/min (by C-G formula based on SCr of 0.94 mg/dL). Recent Labs  Lab 10/13/20 1350  PROCALCITON 2.32  WBC 15.7*  LATICACIDVEN 1.3    Liver Function Tests: Recent Labs  Lab 10/13/20 1350  AST 42*  ALT 42  ALKPHOS 138*  BILITOT 0.5  PROT 6.7  ALBUMIN 2.2*    No results for input(s): LIPASE, AMYLASE in the last 168 hours. No results for input(s): AMMONIA in the last 168 hours.  ABG No results found for: PHART, PCO2ART, PO2ART, HCO3, TCO2, ACIDBASEDEF, O2SAT   Coagulation Profile: Recent Labs  Lab 10/13/20 1350  INR 1.4*    Cardiac Enzymes: No results for input(s): CKTOTAL, CKMB, CKMBINDEX, TROPONINI in the last 168 hours.  HbA1C: No results found for: HGBA1C  CBG: No results for input(s): GLUCAP in the last 168 hours.  Review of Systems:   Unable   Past Medical History:  She,  has a past medical history of CVA (cerebral vascular accident) ([place]) and Dementia ([place]).   Surgical History:   Past Surgical History:  Procedure Laterality Date  . BRAIN SURGERY     age 48     Social History:   reports that she quit smoking about 11 years ago. She has never used smokeless tobacco. She reports that she does not  drink alcohol and does not use drugs.   Family History:  Her family history includes Diabetes in her mother.   Allergies No Known Allergies   Home Medications  Prior to Admission medications   Medication Sig Start Date End Date Taking? Authorizing Provider  famotidine (PEPCID) 10 MG tablet Take 10 mg by mouth daily.   Yes [provider]  guaifenesin (ROBITUSSIN) 100 MG/5ML syrup Take 200 mg by mouth every 4 (four) hours as needed for cough.   Yes [provider]  lactulose (CHRONULAC) 10 GM/15ML solution Take 30 g by mouth daily.   Yes [provider]  levETIRAcetam (KEPPRA) 500 MG tablet Take 1 tablet (500 mg total) by mouth 2 (two) times daily. 05/04/17  Yes Tawny Asal, MD  Multiple Vitamin (MULTIVITAMIN WITH MINERALS) TABS tablet Take 1 tablet by mouth daily.   Yes [provider]  Nutritional Supplements (ENSURE ENLIVE PO) Take 8 fluid ounces by mouth 3 (three) times daily.   Yes [provider]  sennosides-docusate sodium (SENOKOT-S) 8.6-50 MG tablet Take 1 tablet by  mouth in the morning and at bedtime.   Yes [provider]     Critical care time: 35 mins     Kennieth Rad, ACNP  Pulmonary & Critical Care 10/13/2020, 4:30 PM

## 2020-10-13 NOTE — ED Triage Notes (Signed)
Pt from [place] facility, COVID+, unknown when she tested positive. Facility called out due to pt not eating and drinking as she is unable to at this time. Pt has hx of dementia. 87% on room airs, 2L [place] applied. Febrile 102. Pt arrives minimally responsive to pain, hypotensive in the 80s. Productive cough

## 2020-10-13 NOTE — ED Provider Notes (Addendum)
Medical screening examination/treatment/procedure(s) were conducted as a shared visit with non-physician practitioner(s) and myself.  I personally evaluated the patient during the encounter. Briefly, the patient is a 75 y.o. female with seizure disorder, vascular dementia who presents to the ED altered. Patient febrile, tachypneic, hypotensive, on 2 L of oxygen. She comes from a facility where she is known to be COVID-positive. Talk to the son who states that patient tested positive about 2+ weeks ago. He is currently in [place]. Likely with sepsis. Could be COVID-related or pneumonia or UTI. Broad-spectrum IV antibiotics and sepsis protocol initiated.  COVID test is positive. Urinalysis overall equivocal. Chest x-ray shows left-sided pneumonia suspect likely bacterial versus COVID related. CT scan of the head is unremarkable. Lactic acid is normal. Patient does have a white count of 15.7. Overall suspect a bacterial pneumonia causing sepsis. After 2 L of IV fluids patient is still hypotensive. We will add an additional liter of fluid and start patient on Levophed. Mental status appears to be slightly improving. Patient opens her eyes spontaneously. Does not follow commands but does mumble. Overall do not believe she needs to be intubated at this time. The son does still want patient to be full code after multiple discussions with him. We will start patient on Levophed and patient to be admitted to critical care for further care.   Patient continue to get fluid resuscitation in and fever improved and vital signs are now normalized slightly more per critical care has evaluated the patient.  Has reassuring blood gas.  Mentation has been slightly improved.  ICU team will follow along but believe patient would be stable for stepdown unit at this time.  This chart was dictated using voice recognition software.  Despite best efforts to proofread,  errors can occur which can change the documentation meaning.    .Critical Care Performed by: Virgina Norfolk, DO Authorized by: Virgina Norfolk, DO   Critical care provider statement:    Critical care time (minutes):  45   Critical care was time spent personally by me on the following activities:  Discussions with consultants, evaluation of patient's response to treatment, examination of patient, ordering and performing treatments and interventions, ordering and review of laboratory studies, ordering and review of radiographic studies, pulse oximetry, re-evaluation of patient's condition, obtaining history from patient or surrogate and review of old charts   I assumed direction of critical care for this patient from another provider in my specialty: no     Care discussed with: admitting provider        EKG Interpretation  Date/Time:  Saturday October 13 2020 14:02:05 EST Ventricular Rate:  92 PR Interval:    QRS Duration: 120 QT Interval:  356 QTC Calculation: 441 R Axis:   77 Text Interpretation: Sinus rhythm Nonspecific intraventricular conduction delay Confirmed by Virgina Norfolk [phone]) on 10/13/2020 2:25:02 PM           Virgina Norfolk, DO 10/13/20 1510    [name], DO 10/13/20 1813

## 2020-10-13 NOTE — H&P (Signed)
History and Physical    Janet Gutierrez FIE:[id number] DOB: 1945/11/05 DOA: 10/13/2020  PCP: Patient, No Pcp Per   Patient coming from: [facility] facility   I have personally briefly reviewed patient's old medical records in [facility]  Chief Complaint: altered mental status.   HPI: Janet Gutierrez is a 75 y.o. 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 Known covid positive status 2+  weeks ago. Patient was dropped off yesterday at facility by son who is going on cruise. She is new to [employer]. They called EMS due to patient having fever, decreased responsiveness and inability to swallow. Per son in ER physician note, pt. Has minimal response at baseline.   I could not get a hold of the son.   ED Course: patient febrile to 102.9, tachypnic to 105, hypotensive 77/43 and requiring oxygen. (new for her) on arrival to ED. GCS: 7. She was in distress on arrival. Concern for aspiration pneumonia. Code sepsis called. abx and IVF started. bolused 4.5L in ER. CXR showed interval development of left midlung and basilar opacity concerning for pneumonia with possible associated pleural effusion. CT head showed no acute findings. She was covid positive. Labs significant for WBC: 15.7, INR: 1.4, d-dimer: 8.87,  Lactic acid and procalcitonin normal,  CRP: 24.7 and fibrinogen >800. Arterial blood gas not significant. Cultures obtained.   Critical care consulted. Recommended aggressive if fluid resuscitation, broad spectrum abx to treat presumed aspiration/HCAP. NO steroids/remdesivir due to timing of her covid. Given chronic debilitated state, do not believe she would survive mechanical vent/CPR. Recommended aggressive medical care.   Review of Systems: Unable to perform ROS due to mental status.   Past Medical History:   Diagnosis Date  . 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 Dementia [facility])     Past Surgical History:  Procedure Laterality Date  . BRAIN SURGERY     age 48    Social History  reports that she quit smoking about 11 years ago. She has never used smokeless tobacco. She reports that she does not drink alcohol and does not use drugs.  No Known Allergies  Family History  Problem Relation Age of Onset  . Diabetes Mother     Prior to Admission medications   Medication Sig Start Date End Date Taking? Authorizing Provider  famotidine (PEPCID) 10 MG tablet Take 10 mg by mouth daily.   Yes [provider]  guaifenesin (ROBITUSSIN) 100 MG/5ML syrup Take 200 mg by mouth every 4 (four) hours as needed for cough.   Yes [provider]  lactulose (CHRONULAC) 10 GM/15ML solution Take 30 g by mouth daily.   Yes [provider]  levETIRAcetam (KEPPRA) 500 MG tablet Take 1 tablet (500 mg total) by mouth 2 (two) times daily. 05/04/17  Yes Ginger Carne, MD  Multiple Vitamin (MULTIVITAMIN WITH MINERALS) TABS tablet Take 1 tablet by mouth daily.   Yes [provider]  Nutritional Supplements (ENSURE ENLIVE PO) Take 8 fluid ounces by mouth 3 (three) times daily.   Yes [provider]  sennosides-docusate sodium (SENOKOT-S) 8.6-50 MG tablet Take 1 tablet by mouth in the morning and at bedtime.   Yes [provider]    Physical Exam: Vitals:   10/13/20 2000 10/13/20 2015 10/13/20 2030 10/13/20 2045  BP: 97/60 (!) 87/73 92/81 99/62   Pulse: (!) 57 61 (!) 58 61  Resp: 16 17 15 18   Temp:      TempSrc:      SpO2: 99% 96% 98% 99%  Weight:      Height:        Constitutional: NAD, calm, comfortable Vitals:   10/13/20 2000 10/13/20 2015 10/13/20 2030 10/13/20 2045  BP: 97/60 (!) 87/73 92/81 99/62   Pulse: (!) 57 61 (!) 58 61  Resp: 16 17 15 18   Temp:      TempSrc:      SpO2: 99% 96%  98% 99%  Weight:      Height:       Eyes: PERRL, lids and conjunctivae normal. Eyes staring off. Does not follow commands.  ENMT: Mucous membranes are moist. Can not examine mouth/pharnyx  Neck: normal, supple, no masses, no thyromegaly Respiratory:  Right lung field clear. Left lung field hard to examine as she is lying on her left side and can not move her. Left apices clear. Normal work of breathing  Cardiovascular: Regular rate and rhythm, no murmurs / rubs / gallops. No extremity edema. 2+ pedal pulses. No carotid bruits.  Abdomen: no tenderness, no masses palpated. No hepatosplenomegaly. Bowel sounds positive.  Musculoskeletal: no clubbing / cyanosis. No joint deformity upper and lower extremities. muscle wasting Skin: no rashes, lesions, ulcers. No induration. No pressure ulcers on feet or sacrum that I can see. Can not see left side.  Neurologic: will not follow commands. At baseline per son/ER physician.   Psychiatric: non responsive.     Labs on Admission: I have personally reviewed following labs and imaging studies  CBC: Recent Labs  Lab 10/13/20 1350 10/13/20 1631  WBC 15.7*  --   NEUTROABS 13.8*  --   HGB 11.5* 9.5*  HCT 33.8* 28.0*  MCV 98.0  --   PLT 454*  --     Basic Metabolic Panel: Recent Labs  Lab 10/13/20 1350 10/13/20 1631  NA 144 145  K 3.5 3.2*  CL 102  --   CO2 26  --   GLUCOSE 85  --   BUN 28*  --   CREATININE 0.94  --   CALCIUM 8.2*  --     GFR: Estimated Creatinine Clearance: 48.7 mL/min (by C-G formula based on SCr of 0.94 mg/dL).  Liver Function Tests: Recent Labs  Lab 10/13/20 1350  AST 42*  ALT 42  ALKPHOS 138*  BILITOT 0.5  PROT 6.7  ALBUMIN 2.2*    Urine analysis:    Component Value Date/Time   COLORURINE YELLOW 10/13/2020 1415   APPEARANCEUR HAZY (A) 10/13/2020 1415   LABSPEC 1.010 10/13/2020 1415   PHURINE 6.0 10/13/2020 1415   GLUCOSEU NEGATIVE 10/13/2020 1415   HGBUR NEGATIVE 10/13/2020 1415   BILIRUBINUR  NEGATIVE 10/13/2020 1415   KETONESUR 15 (A) 10/13/2020 1415   PROTEINUR NEGATIVE 10/13/2020 1415   NITRITE NEGATIVE 10/13/2020 1415   LEUKOCYTESUR TRACE (A) 10/13/2020 1415    Radiological Exams on Admission: CT Head Wo Contrast  Result Date: 10/13/2020 CLINICAL DATA:  Mental status change.  COVID-19 positive. EXAM: CT HEAD WITHOUT CONTRAST TECHNIQUE: Contiguous axial images were obtained from the base of the skull through the vertex without intravenous contrast. COMPARISON:  05/01/2017 FINDINGS: Brain: Examination demonstrates mild prominence of the ventricles, cisterns and CSF spaces likely representing mild atrophic change. There is minimal chronic ischemic microvascular disease. Evidence of previous right occipital craniectomy with underlying encephalomalacia over  the right cerebellar hemisphere unchanged. No mass, mass effect or shift of midline structures. No acute hemorrhage or acute infarction. Vascular: No hyperdense vessel or unexpected calcification. Skull: Stable postsurgical change compatible prior right occipital craniectomy. Sinuses/Orbits: Orbits are normal symmetric. Minimal opacification over the left sphenoid sinus. Hypoplastic frontal sinuses. Mastoid air cells are clear. Other: None. IMPRESSION: 1. No acute findings. 2. Mild atrophic change and chronic ischemic microvascular disease. 3. Stable right occipital craniectomy with underlying encephalomalacia over the right cerebellar hemisphere. 4. Minimal chronic sinus inflammatory change. Electronically Signed   By: Elberta Fortis M.D.   On: 10/13/2020 14:41   DG Chest Port 1 View  Result Date: 10/13/2020 CLINICAL DATA:  Sepsis. EXAM: PORTABLE CHEST 1 VIEW COMPARISON:  March 22, 2020. FINDINGS: The heart size and mediastinal contours are within normal limits. Right lung is clear. Interval development of left midlung and basilar opacity is noted concerning for pneumonia with possible associated pleural effusion. No pneumothorax is noted.  The visualized skeletal structures are unremarkable. IMPRESSION: Interval development of left midlung and basilar opacity concerning for pneumonia with possible associated pleural effusion. Electronically Signed   By: Lupita Raider M.D.   On: 10/13/2020 14:11    EKG: Independently reviewed. NSR with rate of 92.   Assessment/Plan Principal Problem:   Severe sepsis (HCC) Active Problems:   Pneumonia   COVID-19 virus infection   Alzheimer's disease (HCC)   Altered mental status   Seizures (HCC)   Dysphagia  1) severe sepsis -likely secondary to pneumonia.  -aggressive IVF hydration. bolused 4.5L in ER and responded to IVF. Levophed not started.  -On 150cc/hour x 20 hours of LR  -lactic acid wnl -critical consulted at the time okay for step down, but available if needed. Not a candidate for mechanical intubation.  -urine/blood cultures pending -if MRSA screen negative can consider stopping vanc per critical care.  -broad spectrum abx to treat presumed aspiration/hcap. Pharmacy consulted.  -d-dimer elevated-likely from sepsis, but new need for oxygen and past recent hx of covid/bedbound.  Checking CTA.   2) pneumonia Likely secondary to aspiration vs. HCAP requiring oxygen ? Pleural effusion  Continue broad spectrum antibiotics.  NPO with known dysphagia. MBSS in chart from 03/2020 but no results in chart. - ST consulted.   3) covid 19 Over 2+ weeks ago does not need respiratory isolation -no indication for steroids or remdesivir.   4) dysphagia -NPO and ST ordered  5) seizure hx -IV keppra 500mg  q 12 hours.  -EEG ordered. Consult neurology if appropriate, but appears at baseline.   6) altered mental status -per son at her baseline. Known hx dementia  -CT head with no acute findings  -? Secondary to sepsis -EEG ordered, checking ammonia level/TSH  7) alzheimer's disease -fall precautions/strict bed rest  Son did not answer phone and unsure how much service he will  have if on cruise. Would discuss code status with him.    DVT prophylaxis: lovenox  Code Status:   Full  Family Communication:  Attempted to contact son, Janet Gutierrez. He was leaving for a cruise per ER physician.  Disposition Plan:   Patient is from:  [place] facility  Anticipated DC to:  [facility] facility  Anticipated DC date:  3-5 days  Anticipated DC barriers: Social  Consults called:  Critical care done by ER physician, paula simpson NP/Dr. [facility] Admission status:  inpatient   Severity of Illness: The appropriate patient status for this patient is INPATIENT. Inpatient status is judged to  be reasonable and necessary in order to provide the required intensity of service to ensure the patient's safety. The patient's presenting symptoms, physical exam findings, and initial radiographic and laboratory data in the context of their chronic comorbidities is felt to place them at high risk for further clinical deterioration. Furthermore, it is not anticipated that the patient will be medically stable for discharge from the hospital within 2 midnights of admission. The following factors support the patient status of inpatient.    * I certify that at the point of admission it is my clinical judgment that the patient will require inpatient hospital care spanning beyond 2 midnights from the point of admission due to high intensity of service, high risk for further deterioration and high frequency of surveillance required.*     Orland MustardAllison Derra Shartzer MD [facility]  How to contact the [facility] Attending or Consulting provider 7A - 7P or covering provider during after hours 7P -7A, for this patient?   1. Check the care team in [facility] and look for a) attending/consulting [facility] provider listed and b) the [facility] team listed 2. Log into [email or web address] and use [place]'s universal password to access. If you do not have the password, please contact the hospital operator. 3. Locate the [facility] provider you are  looking for under [facility] and page to a number that you can be directly reached. 4. If you still have difficulty reaching the provider, please page the [facility] (Director on Call) for the Hospitalists listed on amion for assistance.  10/13/2020, 9:14 PM

## 2020-10-13 NOTE — Progress Notes (Signed)
EEG complete - results pending 

## 2020-10-13 NOTE — ED Notes (Signed)
EDP aware of pts BP 

## 2020-10-13 NOTE — ED Provider Notes (Signed)
[facility]   CSN: [id number] Arrival date & time: 10/13/20  1259     History Chief Complaint  Patient presents with  . Covid Positive  . Failure To Thrive    Janet PouSandra Jones Eisenhart is a 75 y.o. female presenting for evaluation of covid and ftt.   Level V caveat due to AMS.   Per triage note, pt from [facility] facility. covid +, unknown when. Ems called due to difficulty swallowing. Pt does not wear O2 at baseline.    additional history obtained from chart review. Pt has h/o CVA at [age] and h/o dementia.   Additional history obtained from [facility]. Pt was dropped off yesterday by son, who is primary caregiver. Pt is new to the facility. Ems was called due to pt having fever, decreased responsiveness, and will not swallow. When son was called, he states MS is baseline.   Dr. Lockie Molauratolo discussed with son. Per son, pt has minimal response at baseline. Pt is full code. Per son, pt was covid + 3 wks ago, but had recovered prior to todays sxs.   HPI     Past Medical History:  Diagnosis Date  . 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 Dementia [facility](HCC)     Patient Active Problem List   Diagnosis Date Noted  . Spell of altered consciousness   . Seizures (HCC)   . Altered mental status 05/01/2017  . Alzheimer's disease (HCC) 11/30/2013    Past Surgical History:  Procedure Laterality Date  . BRAIN SURGERY     age 75     OB History   No obstetric history on file.     Family History  Problem Relation Age of Onset  . Diabetes Mother     Social History   Tobacco Use  . Smoking status: Former Smoker    Quit date: 08/29/2009    Years since quitting: 11.1  . Smokeless tobacco: Never Used  Substance Use Topics  . Alcohol use: No  . Drug use: No    Home Medications Prior to Admission medications   Medication Sig Start Date End Date Taking? Authorizing  Provider  levETIRAcetam (KEPPRA) 100 MG/ML solution Take 5 mLs (500 mg total) by mouth 2 (two) times daily. 05/04/17   Ginger CarneHarden, Kyler, MD  levETIRAcetam (KEPPRA) 500 MG tablet Take 1 tablet (500 mg total) by mouth 2 (two) times daily. 05/04/17   Ginger CarneHarden, Kyler, MD  Multiple Vitamin (MULTIVITAMIN WITH MINERALS) TABS tablet Take 1 tablet by mouth daily.    [provider]  omeprazole (PRILOSEC) 20 MG capsule Take 20 mg by mouth daily.    [provider]    Allergies    Patient has no known allergies.  Review of Systems   Review of Systems  Unable to perform ROS: Mental status change    Physical Exam Updated Vital Signs BP (!) 77/43   Pulse 91   Temp (!) 102.9 F (39.4 C) (Rectal)   Resp (!) 26   Ht 5\' 5"  (1.651 m)   Wt 61.2 kg   SpO2 94%   BMI 22.45 kg/m   Physical Exam Vitals and nursing note reviewed.  Constitutional:      General: She is in acute distress.     Appearance: She is well-nourished. She is cachectic. She is ill-appearing and toxic-appearing.     Comments: Appears ill. Cachectic   HENT:  Head: Normocephalic and atraumatic.     Comments: Secretions noted around the mouth    Mouth/Throat:     Mouth: Mucous membranes are dry.  Eyes:     Extraocular Movements: EOM normal.  Cardiovascular:     Rate and Rhythm: Regular rhythm. Tachycardia present.     Pulses: Normal pulses and intact distal pulses.     Comments: Tachycardic around 105 Pulmonary:     Effort: Tachypnea present.     Breath sounds: Rhonchi present.     Comments: Scattered rhonchi  Abdominal:     General: There is no distension.     Palpations: Abdomen is soft. There is no mass.     Tenderness: There is no abdominal tenderness. There is no guarding or rebound.  Musculoskeletal:        General: Normal range of motion.     Cervical back: Normal range of motion and neck supple.  Skin:    General: Skin is warm and dry.     Capillary Refill: Capillary refill takes less than 2  seconds.  Neurological:     GCS: GCS eye subscore is 1. GCS verbal subscore is 1. GCS motor subscore is 5.     Comments: gcs 7  Psychiatric:        Mood and Affect: Mood and affect normal.     ED Results / Procedures / Treatments   Labs (all labs ordered are listed, but only abnormal results are displayed) Labs Reviewed  COMPREHENSIVE METABOLIC PANEL - Abnormal; Notable for the following components:      Result Value   BUN 28 (*)    Calcium 8.2 (*)    Albumin 2.2 (*)    AST 42 (*)    Alkaline Phosphatase 138 (*)    Anion gap 16 (*)    All other components within normal limits  CBC WITH DIFFERENTIAL/PLATELET - Abnormal; Notable for the following components:   WBC 15.7 (*)    RBC 3.45 (*)    Hemoglobin 11.5 (*)    HCT 33.8 (*)    Platelets 454 (*)    nRBC 0.5 (*)    Neutro Abs 13.8 (*)    Lymphs Abs 0.4 (*)    Monocytes Absolute 1.2 (*)    Abs Immature Granulocytes 0.22 (*)    All other components within normal limits  URINALYSIS, ROUTINE W REFLEX MICROSCOPIC - Abnormal; Notable for the following components:   APPearance HAZY (*)    Ketones, ur 15 (*)    Leukocytes,Ua TRACE (*)    All other components within normal limits  LACTATE DEHYDROGENASE - Abnormal; Notable for the following components:   LDH 221 (*)    All other components within normal limits  URINALYSIS, MICROSCOPIC (REFLEX) - Abnormal; Notable for the following components:   Bacteria, UA FEW (*)    All other components within normal limits  POC SARS CORONAVIRUS 2 AG -  ED - Abnormal; Notable for the following components:   SARS Coronavirus 2 Ag POSITIVE (*)    All other components within normal limits  CULTURE, BLOOD (ROUTINE X 2)  CULTURE, BLOOD (ROUTINE X 2)  URINE CULTURE  LACTIC ACID, PLASMA  TRIGLYCERIDES  LACTIC ACID, PLASMA  PROTIME-INR  APTT  D-DIMER, QUANTITATIVE (NOT AT [facility])  PROCALCITONIN  FERRITIN  FIBRINOGEN  C-REACTIVE PROTEIN    EKG EKG Interpretation  Date/Time:  Saturday  October 13 2020 14:02:05 EST Ventricular Rate:  92 PR Interval:    QRS Duration: 120 QT Interval:  356 QTC Calculation: 441 R Axis:   77 Text Interpretation: Sinus rhythm Nonspecific intraventricular conduction delay Confirmed by Virgina Norfolk [phone]) on 10/13/2020 2:25:02 PM   Radiology CT Head Wo Contrast  Result Date: 10/13/2020 CLINICAL DATA:  Mental status change.  COVID-19 positive. EXAM: CT HEAD WITHOUT CONTRAST TECHNIQUE: Contiguous axial images were obtained from the base of the skull through the vertex without intravenous contrast. COMPARISON:  05/01/2017 FINDINGS: Brain: Examination demonstrates mild prominence of the ventricles, cisterns and CSF spaces likely representing mild atrophic change. There is minimal chronic ischemic microvascular disease. Evidence of previous right occipital craniectomy with underlying encephalomalacia over the right cerebellar hemisphere unchanged. No mass, mass effect or shift of midline structures. No acute hemorrhage or acute infarction. Vascular: No hyperdense vessel or unexpected calcification. Skull: Stable postsurgical change compatible prior right occipital craniectomy. Sinuses/Orbits: Orbits are normal symmetric. Minimal opacification over the left sphenoid sinus. Hypoplastic frontal sinuses. Mastoid air cells are clear. Other: None. IMPRESSION: 1. No acute findings. 2. Mild atrophic change and chronic ischemic microvascular disease. 3. Stable right occipital craniectomy with underlying encephalomalacia over the right cerebellar hemisphere. 4. Minimal chronic sinus inflammatory change. Electronically Signed   By: Elberta Fortis M.D.   On: 10/13/2020 14:41   DG Chest Port 1 View  Result Date: 10/13/2020 CLINICAL DATA:  Sepsis. EXAM: PORTABLE CHEST 1 VIEW COMPARISON:  March 22, 2020. FINDINGS: The heart size and mediastinal contours are within normal limits. Right lung is clear. Interval development of left midlung and basilar opacity is noted concerning  for pneumonia with possible associated pleural effusion. No pneumothorax is noted. The visualized skeletal structures are unremarkable. IMPRESSION: Interval development of left midlung and basilar opacity concerning for pneumonia with possible associated pleural effusion. Electronically Signed   By: Lupita Raider M.D.   On: 10/13/2020 14:11    Procedures .Critical Care Performed by: Alveria Apley, PA-C Authorized by: Alveria Apley, PA-C   Critical care provider statement:    Critical care time (minutes):  45   Critical care time was exclusive of:  Separately billable procedures and treating other patients and teaching time   Critical care was necessary to treat or prevent imminent or life-threatening deterioration of the following conditions:  Sepsis   Critical care was time spent personally by me on the following activities:  Blood draw for specimens, development of treatment plan with patient or surrogate, evaluation of patient's response to treatment, examination of patient, obtaining history from patient or surrogate, ordering and performing treatments and interventions, ordering and review of laboratory studies, pulse oximetry, ordering and review of radiographic studies, review of old charts and re-evaluation of patient's condition   I assumed direction of critical care for this patient from another provider in my specialty: no     Care discussed with: admitting provider   Comments:     Pt septic, likely aspiration pna. Code sepsis called and abx started.    (including critical care time)  Medications Ordered in ED Medications  lactated ringers infusion ( Intravenous New Bag/Given 10/13/20 1410)  vancomycin (VANCOREADY) IVPB 1250 mg/250 mL (1,250 mg Intravenous New Bag/Given 10/13/20 1451)  levETIRAcetam (KEPPRA) IVPB 500 mg/100 mL premix (500 mg Intravenous New Bag/Given 10/13/20 1458)  norepinephrine (LEVOPHED) 4mg  in premix infusion (has no administration in time range)   remdesivir 200 mg in sodium chloride 0.9% 250 mL IVPB (has no administration in time range)    Followed by  remdesivir 100 mg in sodium chloride 0.9 % 100 mL IVPB (  has no administration in time range)  dexamethasone (DECADRON) injection 6 mg (has no administration in time range)  ceFEPIme (MAXIPIME) 2 g in sodium chloride 0.9 % 100 mL IVPB (0 g Intravenous Stopped 10/13/20 1502)  lactated ringers bolus 1,000 mL (0 mLs Intravenous Stopped 10/13/20 1441)    And  lactated ringers bolus 1,000 mL (1,000 mLs Intravenous New Bag/Given 10/13/20 1409)  acetaminophen (TYLENOL) suppository 650 mg (650 mg Rectal Given 10/13/20 1411)  lactated ringers bolus 1,000 mL (1,000 mLs Intravenous New Bag/Given 10/13/20 1502)    ED Course  I have reviewed the triage vital signs and the nursing notes.  Pertinent labs & imaging results that were available during my care of the patient were reviewed by me and considered in my medical decision making (see chart for details).    MDM Rules/Calculators/A&P                          Patient presenting due to fever and decreased mental status.  History difficult to obtain, as patient's primary caregiver is out of town and she is At the facility she was coming from.  Per son, she may be close to her baseline mental status.  On exam, patient appears ill.  She is septic, with hypotension, tachycardia, fever, tachypnea.  She is hypoxic on room air in the upper 80s, sats in the low 90s on 2 l.  Concern for aspiration pneumonia as patient has wet lung sounds on exam and history of dysphagia.  Also consider UTI.  Consider viral illness. Code sepsis called, abx and fluids started. Dr. Lockie Mola evaluated the pt. Dr. Lockie Mola discussed with pt's son, he states she is full code.   Chest x-ray viewed interpreted by me, consistent with pneumonia.  Per radiology, also consider pleural effusion.  CT head negative. White count elevated at 15.7.  Lactic is normal.  Overall electrolytes are  reassuring.  Patient with only few bacteria and trace leukocytes, favor pulmonary cause.  After 2 L of fluid, patient remains hypotensive.  Sepsis reassessment performed.  She will need pressors and admission to critical care.  Dr. Lockie Mola discussed with critical care, pt to be admitted.   Final Clinical Impression(s) / ED Diagnoses Final diagnoses:  Septic shock (HCC)  COVID-19  Community acquired pneumonia, unspecified laterality    Rx / DC Orders ED Discharge Orders    None       Alveria Apley, PA-C 10/13/20 1509    Virgina Norfolk, DO 10/13/20 1628

## 2020-10-13 NOTE — ED Notes (Signed)
Patient transported to CT 

## 2020-10-14 ENCOUNTER — Inpatient Hospital Stay (HOSPITAL_COMMUNITY): Payer: Medicare (Managed Care)

## 2020-10-14 DIAGNOSIS — R569 Unspecified convulsions: Secondary | ICD-10-CM

## 2020-10-14 DIAGNOSIS — F0150 Vascular dementia without behavioral disturbance: Secondary | ICD-10-CM | POA: Diagnosis not present

## 2020-10-14 DIAGNOSIS — R4182 Altered mental status, unspecified: Secondary | ICD-10-CM

## 2020-10-14 DIAGNOSIS — A419 Sepsis, unspecified organism: Secondary | ICD-10-CM | POA: Diagnosis not present

## 2020-10-14 DIAGNOSIS — R6521 Severe sepsis with septic shock: Secondary | ICD-10-CM

## 2020-10-14 LAB — CBC WITH DIFFERENTIAL/PLATELET
Abs Immature Granulocytes: 0.17 10*3/uL — ABNORMAL HIGH (ref 0.00–0.07)
Basophils Absolute: 0.1 10*3/uL (ref 0.0–0.1)
Basophils Relative: 0 %
Eosinophils Absolute: 0 10*3/uL (ref 0.0–0.5)
Eosinophils Relative: 0 %
HCT: 32.2 % — ABNORMAL LOW (ref 36.0–46.0)
Hemoglobin: 10.5 g/dL — ABNORMAL LOW (ref 12.0–15.0)
Immature Granulocytes: 1 %
Lymphocytes Relative: 4 %
Lymphs Abs: 0.7 10*3/uL (ref 0.7–4.0)
MCH: 32.3 pg (ref 26.0–34.0)
MCHC: 32.6 g/dL (ref 30.0–36.0)
MCV: 99.1 fL (ref 80.0–100.0)
Monocytes Absolute: 1.1 10*3/uL — ABNORMAL HIGH (ref 0.1–1.0)
Monocytes Relative: 7 %
Neutro Abs: 15.2 10*3/uL — ABNORMAL HIGH (ref 1.7–7.7)
Neutrophils Relative %: 88 %
Platelets: 375 10*3/uL (ref 150–400)
RBC: 3.25 MIL/uL — ABNORMAL LOW (ref 3.87–5.11)
RDW: 15.1 % (ref 11.5–15.5)
WBC Morphology: INCREASED
WBC: 17.3 10*3/uL — ABNORMAL HIGH (ref 4.0–10.5)
nRBC: 0.2 % (ref 0.0–0.2)

## 2020-10-14 LAB — COMPREHENSIVE METABOLIC PANEL
ALT: 37 U/L (ref 0–44)
AST: 44 U/L — ABNORMAL HIGH (ref 15–41)
Albumin: 1.8 g/dL — ABNORMAL LOW (ref 3.5–5.0)
Alkaline Phosphatase: 112 U/L (ref 38–126)
Anion gap: 12 (ref 5–15)
BUN: 24 mg/dL — ABNORMAL HIGH (ref 8–23)
CO2: 24 mmol/L (ref 22–32)
Calcium: 7.8 mg/dL — ABNORMAL LOW (ref 8.9–10.3)
Chloride: 108 mmol/L (ref 98–111)
Creatinine, Ser: 0.56 mg/dL (ref 0.44–1.00)
GFR, Estimated: >60 mL/min (ref >60)
Glucose, Bld: 108 mg/dL — ABNORMAL HIGH (ref 70–99)
Potassium: 3.4 mmol/L — ABNORMAL LOW (ref 3.5–5.1)
Sodium: 144 mmol/L (ref 135–145)
Total Bilirubin: 0.6 mg/dL (ref 0.3–1.2)
Total Protein: 5.7 g/dL — ABNORMAL LOW (ref 6.5–8.1)

## 2020-10-14 LAB — POCT I-STAT 7, (LYTES, BLD GAS, ICA,H+H)
Acid-Base Excess: 3 mmol/L — ABNORMAL HIGH (ref 0.0–2.0)
Bicarbonate: 26.4 mmol/L (ref 20.0–28.0)
Calcium, Ion: 1.14 mmol/L — ABNORMAL LOW (ref 1.15–1.40)
HCT: 32 % — ABNORMAL LOW (ref 36.0–46.0)
Hemoglobin: 10.9 g/dL — ABNORMAL LOW (ref 12.0–15.0)
O2 Saturation: 93 %
Patient temperature: 96.4
Potassium: 2.8 mmol/L — ABNORMAL LOW (ref 3.5–5.1)
Sodium: 145 mmol/L (ref 135–145)
TCO2: 27 mmol/L (ref 22–32)
pCO2 arterial: 31.9 mmHg — ABNORMAL LOW (ref 32.0–48.0)
pH, Arterial: 7.522 — ABNORMAL HIGH (ref 7.350–7.450)
pO2, Arterial: 55 mmHg — ABNORMAL LOW (ref 83.0–108.0)

## 2020-10-14 LAB — AMMONIA: Ammonia: 14 umol/L (ref 9–35)

## 2020-10-14 LAB — LACTIC ACID, PLASMA
Lactic Acid, Venous: 1.2 mmol/L (ref 0.5–1.9)
Lactic Acid, Venous: 1 mmol/L (ref 0.5–1.9)

## 2020-10-14 LAB — GLUCOSE, CAPILLARY: Glucose-Capillary: 83 mg/dL (ref 70–99)

## 2020-10-14 LAB — CK: Total CK: 244 U/L — ABNORMAL HIGH (ref 38–234)

## 2020-10-14 LAB — PHOSPHORUS: Phosphorus: 3.6 mg/dL (ref 2.5–4.6)

## 2020-10-14 LAB — MAGNESIUM: Magnesium: 2.3 mg/dL (ref 1.7–2.4)

## 2020-10-14 LAB — TSH: TSH: 0.402 u[IU]/mL (ref 0.350–4.500)

## 2020-10-14 MED ORDER — SODIUM CHLORIDE 0.9 % IV SOLN
250.0000 mL | INTRAVENOUS | Status: DC
  Administered 2020-10-14 – 2020-10-22 (×2): 250 mL via INTRAVENOUS

## 2020-10-14 MED ORDER — DOPAMINE-DEXTROSE 3.2-5 MG/ML-% IV SOLN
2.5000 ug/kg/min | INTRAVENOUS | Status: DC
  Administered 2020-10-18: 2.5 ug/kg/min via INTRAVENOUS
  Filled 2020-10-14: qty 250

## 2020-10-14 MED ORDER — SODIUM CHLORIDE 0.9 % IV BOLUS
1000.0000 mL | Freq: Once | INTRAVENOUS | Status: AC
  Administered 2020-10-14: 1000 mL via INTRAVENOUS

## 2020-10-14 MED ORDER — MIDODRINE HCL 5 MG PO TABS
5.0000 mg | ORAL_TABLET | Freq: Three times a day (TID) | ORAL | Status: DC
  Administered 2020-10-14 (×2): 5 mg via ORAL
  Filled 2020-10-14 (×2): qty 1

## 2020-10-14 MED ORDER — MIDODRINE HCL 5 MG PO TABS: 5.0000 mg | ORAL_TABLET | Freq: Three times a day (TID) | ORAL | Status: DC

## 2020-10-14 MED ORDER — ALBUMIN HUMAN 25 % IV SOLN
25.0000 g | Freq: Four times a day (QID) | INTRAVENOUS | Status: AC
Start: 2020-10-14 — End: 2020-10-15
  Administered 2020-10-14 – 2020-10-15 (×4): 25 g via INTRAVENOUS
  Filled 2020-10-14 (×5): qty 100

## 2020-10-14 MED ORDER — DOPAMINE-DEXTROSE 3.2-5 MG/ML-% IV SOLN
INTRAVENOUS | Status: AC
  Administered 2020-10-14: 2.5 ug/kg/min via INTRAVENOUS
  Filled 2020-10-14: qty 250

## 2020-10-14 MED ORDER — NOREPINEPHRINE 4 MG/250ML-% IV SOLN
2.0000 ug/min | INTRAVENOUS | Status: DC
  Administered 2020-10-14: 22:00:00 8 ug/min via INTRAVENOUS
  Administered 2020-10-15 – 2020-10-17 (×3): 6 ug/min via INTRAVENOUS
  Administered 2020-10-17: 7 ug/min via INTRAVENOUS
  Administered 2020-10-18: 08:00:00 11 ug/min via INTRAVENOUS
  Administered 2020-10-18 (×3): 10 ug/min via INTRAVENOUS
  Administered 2020-10-19: 5 ug/min via INTRAVENOUS
  Administered 2020-10-19: 05:00:00 9 ug/min via INTRAVENOUS
  Administered 2020-10-20 – 2020-10-21 (×2): 3 ug/min via INTRAVENOUS
  Filled 2020-10-14 (×15): qty 250

## 2020-10-14 MED ORDER — NOREPINEPHRINE 4 MG/250ML-% IV SOLN
INTRAVENOUS | Status: AC
  Administered 2020-10-14: 5 ug/min via INTRAVENOUS
  Filled 2020-10-14: qty 250

## 2020-10-14 MED ORDER — SODIUM CHLORIDE 0.9 % IV SOLN: 250.0000 mL | INTRAVENOUS | Status: DC

## 2020-10-14 MED ORDER — POTASSIUM CHLORIDE 20 MEQ PO PACK
40.0000 meq | PACK | Freq: Once | ORAL | Status: AC
  Administered 2020-10-14: 40 meq
  Filled 2020-10-14 (×2): qty 2

## 2020-10-14 MED ORDER — CHLORHEXIDINE GLUCONATE CLOTH 2 % EX PADS
6.0000 | MEDICATED_PAD | Freq: Every day | CUTANEOUS | Status: DC
  Administered 2020-10-14 – 2020-10-17 (×4): 6 via TOPICAL

## 2020-10-14 MED ORDER — PIPERACILLIN-TAZOBACTAM 3.375 G IVPB
3.3750 g | Freq: Three times a day (TID) | INTRAVENOUS | Status: AC
  Administered 2020-10-14 – 2020-10-20 (×21): 3.375 g via INTRAVENOUS
  Filled 2020-10-14 (×23): qty 50

## 2020-10-14 MED ORDER — ACETAMINOPHEN 650 MG RE SUPP
650.0000 mg | Freq: Four times a day (QID) | RECTAL | Status: DC | PRN
  Administered 2020-10-16 (×2): 650 mg via RECTAL
  Filled 2020-10-14 (×2): qty 1

## 2020-10-14 NOTE — Progress Notes (Signed)
Pt arrived to 2c01, on airborne and contact for Covid + diagnosis.  Pt nonverbal, does not follow commands.  Pt arms and legs contracted, favors Left side.  No skin breakdown noted.  Pt on 4L oxygen sats 94%.  Rectal temp 93.9, Bair hugger placed on pt.  CBG 85.  SB 50's, bp 91/55 (66).  No family available at this time.  Paged Dr. Para March with [facility] to make aware of pt status and temperature.  Will continue to monitor closely.

## 2020-10-14 NOTE — Progress Notes (Addendum)
Night coverage  Patient with persistent hypotension in spite of aggressive fluid resuscitation.  Was originally evaluated by intensivist at 4 PM on 1/15.  E link contacted for reconsideration of admission to the ICU for pressors Discussed with the link, Dr. Celine Mans who will review.

## 2020-10-14 NOTE — Progress Notes (Signed)
Patient still labile Bps and comatose, to go to ICU for peripheral pressors. Family apparently to [place] 4PM from [place] to discuss case with physician, Dr. Delton Coombes will handle this discussion.  Myrla Halsted MD PCCM

## 2020-10-14 NOTE — Progress Notes (Signed)
RN from [facility] called for an update on patient and provided the number [phone] to get in touch with the son. Contact added to patient flow sheet.

## 2020-10-14 NOTE — Progress Notes (Signed)
SLP Cancellation Note  Patient Details Name: Janet Gutierrez MRN: [id number] DOB: 01-Aug-1946   Cancelled treatment:       Reason Eval/Treat Not Completed: Fatigue/lethargy limiting ability to participate (Pt was approached for BSE, but pt's RN reported that the pt is not currently alert enough to participate, and is not demonstrating alertness even sternal rub. SLP will follow up on subsequent date unless contacted sooner by staff.)  Yvone Neu I. Vear Clock, MS, CCC-SLP Acute Rehabilitation Services Office number [phone] Pager [phone]  Scheryl Marten 10/14/2020, 9:29 AM

## 2020-10-14 NOTE — Progress Notes (Signed)
Pt transferred from [facility] for hypotension and need for peripheral pressors around 1445.   She arrived in NSR 60's, BP 81/49 (60), 94% on 5L HFNC.   She quickly became hypotensive requiring levo gtt.   At 1550 pt became bradycardic 30's but came back up to 60-70 without intervention. Levo increased per pharmacy to help with HR control.  Bradycardic 30's at 1550 without return to higher rate while also desaturating. HFNC increased to 15L and nonrebreather 100% placed on patient. Dopamine gtt started to increased HR.  Pt continuing to be bradycardic 45-55 bpm with intermittent increase to 70-80 bpm.   Continuing to closely monitor patient.

## 2020-10-14 NOTE — Progress Notes (Signed)
Son and daughter in law called from cruise [place] stating that they had seen all the calls trying to be reached about their mother. They expressed concern as to how she declined so much in 24 hours since they dropped her off for respite care at the facility. They asked a lot of questions about medical condition that needed to be answered by a physician, I told them I could have a doctor give them a call but they said there was no way to be reached they would just plan to call periodically from the cruise ship.

## 2020-10-14 NOTE — Progress Notes (Addendum)
EEG complete - results pending. Per Dr. Wilford Corner Routine only

## 2020-10-14 NOTE — Progress Notes (Addendum)
PCCM Interval Note  I was able to speak with Ms. Mould's son Mena Goes and daughter-in-law who are out of town.  They were able to call in from [place].  Explained to them their mothers current status, critical illness to include her left lower lobe pneumonia and associated severe sepsis, borderline shock and bradycardia.  All questions answered.  I explained her current care including antibiotics.  Also explained that she is receiving IV fluids, boluses, is a candidate for vasopressors via peripheral IV.  She may benefit from PICC or CVC at some point to facilitate vasoactive medications.  PICC would be preferred as it is less invasive.  I explained that given her chronic conditions, I do not believe that the patient would survive mechanical ventilation or CPR to an acceptable outcome.  They understood this.  I recommended that mechanical ventilation, CPR be deferred and they agreed.  I will place orders to this effect in the chart.  In the meantime I assured them that we would work hard with all medical care to stabilize and allow her a chance to show [place] if she can recover from this pneumonia.  They will call periodically to get updates.  The patient has another son Leonidas Romberg who can be updated, is available for discussions as well.  His number is in the chart. He is going to try to travel to the hospital when the weather clears.   Independent CC time 30 minutes   Levy Pupa, MD, PhD 10/14/2020, 5:03 PM [facility] [phone] or if no answer [phone]

## 2020-10-14 NOTE — Progress Notes (Signed)
[facility] PROGRESS NOTE  Patient: Janet Gutierrez MGN:[id number]   PCP: Patient, No Pcp Per DOB: 03/19/1946   DOA: 10/13/2020   DOS: 10/14/2020    Assessment and plan: Overnight BP worsened and PCCM was consulted for pressor support and need for ICU transfer.  Currently care has been transferred to [facility].  Neurology has been consulted for assistance with seizure disorder.  Pt is a [facility] Pt.  This is a no charge note.  Author: Lynden Oxford, MD [facility] 10/14/2020 7:38 AM   If 7PM-7AM, please contact night-coverage at [email or web address]

## 2020-10-14 NOTE — Procedures (Signed)
Patient Name: Janet Gutierrez  MRN: [id number]  Epilepsy Attending: Charlsie Quest  Referring Physician/Provider: Dr Chalmers Guest Date: 10/14/2020 Duration: 24.57 mins  Patient history: 74yo F with AMS. EEG to evaluate for seizure.  Level of alertness: lethargic  AEDs during EEG study: LEV  Technical aspects: This EEG study was done with scalp electrodes positioned according to the 10-20 International system of electrode placement. Electrical activity was acquired at a sampling rate of 500Hz  and reviewed with a high frequency filter of 70Hz  and a low frequency filter of 1Hz . EEG data were recorded continuously and digitally stored.   Description: No posterior dominant rhythm was seen. EEG showed generalized periodic discharges with triphasic morphology at 0.5Hz . There is also continuous generalized 3 to 6 Hz theta-delta slowing. Hyperventilation and photic stimulation were not performed.     ABNORMALITY - Periodic discharges with triphasic morphology, generalized - Continued slow, generalized  IMPRESSION: This study showed periodic discharges with triphasic morphology at 0.5Hz  which can be on the ictal-interittcal continuum but given morphology as well as frequency is more likely due to toxic-metabolic causes. There is also moderate diffuse encephalopathy, non specific etiology. No seizures were seen throughout the recording.  Easton Sivertson 

## 2020-10-14 NOTE — Progress Notes (Signed)
NAME:  Janet Gutierrez, MRN:  [id number], DOB:  08/29/1946, LOS: 1 ADMISSION DATE:  10/13/2020, CONSULTATION DATE:  10/13/2020 REFERRING MD:  Dr. Ronnald Nian, CHIEF COMPLAINT:  AMS/ hypotension  Brief History:  75 year old female w/ hx of vascular dementia, seizures, known COVID positive sent from assisted living with AMS, difficulty swallowing, hypoxia, and fever.  Found to have pneumonia and possible UTI, ongoing hypotension in ER despite IVFs.  PCCM consulted for ICU admit.   History of Present Illness:  HPI obtained from medical chart review as patient is encephalopathic at baseline.  Unable to reach son by phone.     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  She is also COVID positive, tested positive 12/29.    She reportedly lives with her son at home, who is her primary caregiver in the evening and weekends and goes [facility] during the day.  Reported she needs full assistance for her ADLs.  Currently, son is out of town in [place] on vacation.  In ER, patient febrile, tachycardic, hypotensive in the 70's, and requiring 2L [place].  Her GCS has been around 7, unclear what her true baseline is but thought to be near baseline and non- verbal.  Labs noted for BUN 28, AG 16, alk phos 138, albumin 2.2, AST 42, LDH 221, PCT 2.32, WBC 15.7, Hgb 11.5, HCT 33.8, INR 1.4, COVID positive, CXR showing left lower infiltrate and pleural effusion, UA with trace leukocytes.  [place] without acute findings. Received 3L fluids in ER with some improvement in blood pressure but MAP remains marginal.  Cultures sent and started on cefepime and vancomycin.  Mental status has improved mildly in ER, but still not following commands or verbal.  EDP was able to talk with son previously who verified he wants everything done for this mother.   PCCM called for possible ICU  admit.   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 No acute findings. 2. Mild atrophic change and chronic ischemic microvascular disease. 3. Stable right occipital craniectomy with underlying encephalomalacia over the right cerebellar hemisphere. 4. Minimal chronic sinus inflammatory change.  Micro Data:  1/15 SARS >> positive  1/15 BCx 2 >> 1/15 UC >>  Antimicrobials:  1/15 cefepime >> 1/15 vancomycin >> 1/15 MRSA  Interim History / Subjective:   Called back this morning by hospital medicine team - patient has received 5.5L of fluid bolus over the last 17 hours and has persistent hypotension with SBP in the 70s. Per nursing  there has been no change in her mentation. She does not follow commands  Objective   Blood pressure (!) 75/45, pulse 75, temperature 97.9 F (36.6 C), temperature source Rectal, resp. rate 20, height 5' 4"  (1.626 m), weight 52.3 kg, SpO2 90 %.        Intake/Output Summary (Last 24 hours) at 10/14/2020 0160 Last data filed at 10/13/2020 1716 Gross per 24 hour  Intake 4450.05 ml  Output --  Net 4450.05 ml   Filed Weights   10/13/20 1322 10/14/20 0027  Weight: 61.2 kg 52.3 kg   Examination: General:  Chronically ill, cachetic elderly female in no distress in bed, on left  side HEENT: MM pink/extremely dry, chapped lips Neuro: Will open eyes and move extremities spont but doesn't f/c.  Blinks to threat, +cough and gag  CV: rr, no mumur, +2 radial pulses PULM:  Non labored, on 3L [place], weak wet cough- scattered rhonchi, diminished in left base GI: soft, bs active  Extremities: warm/dry, no LE edema  Skin: no rashes, sacrum not visualized  Resolved Hospital Problem list    Assessment & Plan:   Septic Shock secondary to LL pneumonia, possible aspiration vs HAP Has  received aggressive volume resuscitation without improvement.  Will transfer to ICU for initiation of vasopressor support Continue vancomycin and cefepime  Acute hypoxemic respiratory failure COVID positive December 2021 Continue oxygen support. There is a large left mainstem mucus plug likely secondary to aspiration and inability to manage secretions Aspiration precautions, NPO for now. She had a swallow evaluation in July 2021 which recommended dysphagia diet with 24 hour assistance with feeds  Acute on chronic  Encephalopathy, secondary to sepsis, history of CVA with end stage vascular dementia, history of seizure disorder Unclear baseline mental status and no collateral information available, however it appears she is maximum assist for all ADLs CT head clear EEG here shows periodic epileptiform discharges. Suspect she could be post ictal as well.  Continue keppra Follow up with neurology today regarding additional recommendations for seizures  Best practice (evaluated daily)  Diet: NPO Pain/Anxiety/Delirium protocol (if indicated): n/a VAP protocol (if indicated): n/a DVT prophylaxis: heparin SQ/ SCDs GI prophylaxis: n/a Glucose control: trend on BMET Mobility: advance as able; will need PT Disposition: transfer to ICU for vasopressor support  Goals of Care:  Last date of multidisciplinary goals of care discussion: pending Family and staff present: n/a Summary of discussion: n/a Follow up goals of care discussion due: pending Code Status: full.  Attempted to call son and daughter in law. Straight to voicemail. Apparently they are on a cruise.   Labs   CBC: Recent Labs  Lab 10/13/20 1350 10/13/20 1631 10/14/20 0109  WBC 15.7*  --  17.3*  NEUTROABS 13.8*  --  15.2*  HGB 11.5* 9.5* 10.5*  HCT 33.8* 28.0* 32.2*  MCV 98.0  --  99.1  PLT 454*  --  356    Basic Metabolic Panel: Recent Labs  Lab 10/13/20 1350 10/13/20 1631 10/14/20 0109  NA 144 145 144  K 3.5 3.2*  3.4*  CL 102  --  108  CO2 26  --  24  GLUCOSE 85  --  108*  BUN 28*  --  24*  CREATININE 0.94  --  0.56  CALCIUM 8.2*  --  7.8*  MG  --   --  2.3  PHOS  --   --  3.6   GFR: Estimated Creatinine Clearance: 52.5 mL/min (by C-G formula based on SCr of 0.56 mg/dL). Recent Labs  Lab 10/13/20 1350 10/13/20 1628 10/14/20 0109  PROCALCITON 2.32  --   --   WBC 15.7*  --  17.3*  LATICACIDVEN 1.3 1.4  --     Liver Function Tests: Recent Labs  Lab 10/13/20 1350 10/14/20 0109  AST 42* 44*  ALT 42 37  ALKPHOS 138* 112  BILITOT 0.5 0.6  PROT 6.7 5.7*  ALBUMIN 2.2* 1.8*   No results for input(s): LIPASE, AMYLASE in the last 168 hours. Recent Labs  Lab 10/14/20 0109  AMMONIA 14    ABG    Component Value Date/Time   PHART 7.496 (H) 10/13/2020 1631  PCO2ART 38.4 10/13/2020 1631   PO2ART 66 (L) 10/13/2020 1631   HCO3 29.7 (H) 10/13/2020 1631   TCO2 31 10/13/2020 1631   O2SAT 94.0 10/13/2020 1631     Coagulation Profile: Recent Labs  Lab 10/13/20 1350  INR 1.4*    Cardiac Enzymes: No results for input(s): CKTOTAL, CKMB, CKMBINDEX, TROPONINI in the last 168 hours.  HbA1C: No results found for: HGBA1C  CBG: Recent Labs  Lab 10/14/20 0041  GLUCAP 83    Review of Systems:   Unable to obtain  Past Medical History:  She,  has a past medical history of CVA (cerebral vascular accident) ([place]) and Dementia ([place]).   Surgical History:   Past Surgical History:  Procedure Laterality Date  . BRAIN SURGERY     age 13     Social History:   reports that she quit smoking about 11 years ago. She has never used smokeless tobacco. She reports that she does not drink alcohol and does not use drugs.   Family History:  Her family history includes Diabetes in her mother.   Allergies No Known Allergies   Home Medications  Prior to Admission medications   Medication Sig Start Date End Date Taking? Authorizing Provider  famotidine (PEPCID) 10 MG tablet Take 10 mg by  mouth daily.   Yes [provider]  guaifenesin (ROBITUSSIN) 100 MG/5ML syrup Take 200 mg by mouth every 4 (four) hours as needed for cough.   Yes [provider]  lactulose (CHRONULAC) 10 GM/15ML solution Take 30 g by mouth daily.   Yes [provider]  levETIRAcetam (KEPPRA) 500 MG tablet Take 1 tablet (500 mg total) by mouth 2 (two) times daily. 05/04/17  Yes Tawny Asal, MD  Multiple Vitamin (MULTIVITAMIN WITH MINERALS) TABS tablet Take 1 tablet by mouth daily.   Yes [provider]  Nutritional Supplements (ENSURE ENLIVE PO) Take 8 fluid ounces by mouth 3 (three) times daily.   Yes [provider]  sennosides-docusate sodium (SENOKOT-S) 8.6-50 MG tablet Take 1 tablet by mouth in the morning and at bedtime.   Yes [provider]     Critical care time: 33 mins    The patient is critically ill with multiple organ systems failure and requires high complexity decision making for assessment and support, frequent evaluation and titration of therapies, application of advanced monitoring technologies and extensive interpretation of multiple databases.   Critical Care Time devoted to patient care services described in this note is 33 minutes. This time reflects time of care of this [place] . This critical care time does not reflect separately billable procedures or procedure time, teaching time or supervisory time of PA/NP/Med student/Med Resident etc but could involve care discussion time.  [facility] 10/14/2020 6:13 AM  Pager: see AMION After hours pager: [phone]

## 2020-10-14 NOTE — Progress Notes (Addendum)
10/14/2020   Seen and examined. Winces and groans to painful stimuli Not in distress Protecting airway Ext contracted No seizure activity on exam or EEG.  Okay to leave in PCU for now as long as nurses are comfortable with it.  Please reach out to me on secure chat if any issues (if after 7pm please call E-link) MAP goal 60 Place cortrak, start midodrine and enteral feeds Continue abx  Family dropped patient off then went on cruise and unable to communicate with them.  She has end stage dementia, chronic aspiration syndrome, and muscular deconditioning.  Mechanical ventilation would be futile.  Hopefully family can come in and discuss otherwise we are in a gray area from a medical-legal standpoint.  Myrla Halsted MD PCCM

## 2020-10-14 NOTE — Consult Note (Addendum)
Neurology Consultation  Reason for Consult: Altered mental status, history of seizures Referring Physician: Dr. Corliss Blacker  CC: Altered mental status, seizures history.  History is obtained from: Chart review.  Attempted to call the son and daughter-in-law's number listed on the chart-no response.  HPI: Janet Gutierrez is a 75 y.o. female past medical history of stroke with residual mild right hemiparesis, dementia-unknown functional status-family unreachable, presented with altered mental status to the ER.  Known to be COVID-positive about 2 weeks ago and currently also COVID-positive.  She was dropped off yesterday at a facility by son, who is presumably leaving for a cruise.  She was new at the facility and they called EMS because patient was had fevers, diminished responsiveness and inability to swallow.  It seems like, mentation wise, based on the ER provider conversation with the son, she is minimally responsive at baseline. She has a history of seizure disorder is also listed.  No witnessed seizure. She continues to be hypotensive and altered for which a neurological consultation was obtained to ensure no underlying neurological etiology that can be managed. Patient unable to provide any history and family still remains unreachable.   LKW: Unknown tpa given?: no, unknown last known well Premorbid modified Rankin scale (mRS): Based on a brief note, in the chart, 4-5.  ROS: Unable to ascertain due to mentation  Past Medical History:  Diagnosis Date  . 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 Dementia (HCC)     Family History  Problem Relation Age of Onset  . Diabetes Mother     Social History:   reports that she quit smoking about 11 years ago. She has never used smokeless tobacco. She reports that she does not drink alcohol and does not use drugs.  Medications  Current Facility-Administered Medications:  .   enoxaparin (LOVENOX) injection 40 mg, 40 mg, Subcutaneous, Q24H, Orland Mustard, MD, 40 mg at 10/14/20 0054 .  levETIRAcetam (KEPPRA) IVPB 500 mg/100 mL premix, 500 mg, Intravenous, Q12H, Orland Mustard, MD, Last Rate: 400 mL/hr at 10/14/20 0941, 500 mg at 10/14/20 0941 .  piperacillin-tazobactam (ZOSYN) IVPB 3.375 g, 3.375 g, Intravenous, Q8H, Charlott Holler, MD, Last Rate: 12.5 mL/hr at 10/14/20 0809, 3.375 g at 10/14/20 0809 .  vancomycin (VANCOCIN) IVPB 1000 mg/200 mL premix, 1,000 mg, Intravenous, Q24H, Joaquim Lai, RPH   Exam: Current vital signs: BP (!) 93/59   Pulse 72   Temp 98.8 F (37.1 C) (Oral)   Resp (!) 23   Ht 5\' 4"  (1.626 m)   Wt 52.3 kg   SpO2 92%   BMI 19.79 kg/m  Vital signs in last 24 hours: Temp:  [93.9 F (34.4 C)-102.9 F (39.4 C)] 98.8 F (37.1 C) (01/16 0749) Pulse Rate:  [57-105] 72 (01/16 0930) Resp:  [14-44] 23 (01/16 0930) BP: (75-105)/(43-81) 93/59 (01/16 0930) SpO2:  [90 %-100 %] 92 % (01/16 0930) Weight:  [52.3 kg-61.2 kg] 52.3 kg (01/16 0027) General: Cachectic and extremely sick looking, obtunded, in no distress HEENT: Normocephalic atraumatic, edentulous, dry oral mucous membranes Lungs: Clear Cardiovascular: Regular rate rhythm Abdomen soft nondistended nontender Extremities warm well perfused with no edema Neurological exam Obtunded, does not open eyes to voice or follow commands. Actively resists eye opening Pupils equal round reactive to light with roving eye movements Right upper extremity with increased tone and is flexed at the elbow partially. To noxious stimulation- grimaces equally when pinched in  all 4 extremities. To noxious stimulation, minimal withdrawal in all 4 extremities NIHSS 1a Level of Conscious.: 2 1b LOC Questions: 2 1c LOC Commands: 2 2 Best Gaze: 0 3 Visual: 0 4 Facial Palsy: 0 5a Motor Arm - left: 3 5b Motor Arm - Right: 3 6a Motor Leg - Left: 3 6b Motor Leg - Right: 3 7 Limb Ataxia: 0 8 Sensory:  0 9 Best Language: 3 10 Dysarthria: 2 11 Extinct. and Inatten.: 0 TOTAL: 23  Labs I have reviewed labs in epic and the results pertinent to this consultation are: Leukocytosis, hypokalemia, uremia with BUN 24, total albumin 1.8.  AST 44.  CBC    Component Value Date/Time   WBC 17.3 (H) 10/14/2020 0109   RBC 3.25 (L) 10/14/2020 0109   HGB 10.5 (L) 10/14/2020 0109   HCT 32.2 (L) 10/14/2020 0109   PLT 375 10/14/2020 0109   MCV 99.1 10/14/2020 0109   MCH 32.3 10/14/2020 0109   MCHC 32.6 10/14/2020 0109   RDW 15.1 10/14/2020 0109   LYMPHSABS 0.7 10/14/2020 0109   MONOABS 1.1 (H) 10/14/2020 0109   EOSABS 0.0 10/14/2020 0109   BASOSABS 0.1 10/14/2020 0109    CMP     Component Value Date/Time   NA 144 10/14/2020 0109   K 3.4 (L) 10/14/2020 0109   CL 108 10/14/2020 0109   CO2 24 10/14/2020 0109   GLUCOSE 108 (H) 10/14/2020 0109   BUN 24 (H) 10/14/2020 0109   CREATININE 0.56 10/14/2020 0109   CALCIUM 7.8 (L) 10/14/2020 0109   PROT 5.7 (L) 10/14/2020 0109   ALBUMIN 1.8 (L) 10/14/2020 0109   AST 44 (H) 10/14/2020 0109   ALT 37 10/14/2020 0109   ALKPHOS 112 10/14/2020 0109   BILITOT 0.6 10/14/2020 0109   GFRNONAA >60 10/14/2020 0109   GFRAA >60 05/01/2017 0343  Urinalysis markable   Imaging I have reviewed the images obtained: CT-scan of the brain-mild atrophic changes and chronic ischemic microvascular disease.  Stable right occipital craniectomy with underlying encephalomalacia over the right cerebellar hemisphere.  Minimal chronic sinus inflammatory change   CTA of the chest with mucous plugging within the left mainstem bronchus and throughout the bronchiolitis.  There is near complete consolidation throughout the left lung which could be due to atelectasis or multifocal pneumonia.  Small loculated pleural effusion.    Assessment:  75 year old past medical history of stroke with residual mild right hemiparesis, dementia with unknown functional status, questionable  history of seizures as well who was brought in for altered mental status from a facility.  Presumably dropped by family who are out on travel. No history available despite multiple attempts to reach family-I also attempted to call both numbers listed on the chart with no success. To the best of my chart review, her baseline function seems to be extremely poor with a modified Rankin scale of 4-5. At this point, she is hypotensive, has leukocytosis-signs point to sepsis.  Also is COVID-positive. Critical care team consulted me for assistance with a possible underlying neurological etiology given the history of stroke and seizures. At this time, I do not think she is having subclinical seizures clinically but I will hook her up to EEG to confirm. She looks extremely cachectic and has extensive muscle wasting.  Neurology note from 2018 mentioned that she was awake and alert but remained aphasic at that time.  I am not sure how she was progressed over the last 4 years. Without any history and any family members available  at bedside, hard to ascertain a proper baseline and how much she is off of her baseline.  Impression: Altered mental status/toxic metabolic encephalopathy in the setting of COVID-19 infection and possible sepsis History of seizures-evaluate for ongoing seizures History of prior strokes with right hemiparesis  Recommendations: Continue Keppra 500 Management of toxic metabolic derangements per primary team as you are EEG Maintain seizure precautions MRI of the brain when able to Continue attempts to reach family for more history We will follow her with you. I was specifically asked to also comment on whether intubation would be a good idea for her or would it be futile.  At this point, without a good baseline information, I think it is too soon for [place] to be able to make that decision.  If she continues to worsen and the family is unreachable, at that point we can reassess her  neurological exam and try to be of more assistance but at this time, it is prudent to look for any reversible causes that might be causing her altered mental status including correction of toxic metabolic derangements and looking for seizures and treating them if present.  Plan was discussed preliminarily with Dr. Delton Coombes and Dr Katrinka Blazing - PCCM   Addendum - 1638 hrs EEG suggestive of triphasic morphology-likely toxic metabolic encephalopathy. Imaging unable to More history from family when able to We will follow.    -- Milon Dikes, MD Neurologist [facility] Pager: [phone]  CRITICAL CARE ATTESTATION Performed by: Milon Dikes, MD Total critical care time: 44 minutes Critical care time was exclusive of separately billable procedures and treating other patients and/or supervising APPs/Residents/Students Critical care was necessary to treat or prevent imminent or life-threatening deterioration due to  Altered mental status/toxic metabolic encephalopathy in the setting of COVID-19 infection and possible sepsis This patient is critically ill and at significant risk for neurological worsening and/or death and care requires constant monitoring. Critical care was time spent personally by me on the following activities: development of treatment plan with patient and/or surrogate as well as nursing, discussions with consultants, evaluation of patient's response to treatment, examination of patient, obtaining history from patient or surrogate, ordering and performing treatments and interventions, ordering and review of laboratory studies, ordering and review of radiographic studies, pulse oximetry, re-evaluation of patient's condition, participation in multidisciplinary rounds and medical decision making of high complexity in the care of this patient.

## 2020-10-14 NOTE — Progress Notes (Signed)
Chart reviewed for assessment of COVID precautions per request of Christy,RN. Statement by son of COVID+ 2 or 3 weeks ago documented in pt's chart. No test results available for this.  POC COVID-19+ result on 1.15.2022 at 14:19 in ED. Also temp 102 per ED nurse.  Pt currently on 2C but being transferred to [facility]. IP recommends continuing Airborne/Contact precautions per COVID-19 guidelines based on current condition.  Information reported to Christy,RN on 2C.

## 2020-10-15 ENCOUNTER — Inpatient Hospital Stay (HOSPITAL_COMMUNITY): Payer: Medicare (Managed Care)

## 2020-10-15 ENCOUNTER — Inpatient Hospital Stay: Payer: Self-pay

## 2020-10-15 DIAGNOSIS — I339 Acute and subacute endocarditis, unspecified: Secondary | ICD-10-CM | POA: Diagnosis not present

## 2020-10-15 DIAGNOSIS — R401 Stupor: Secondary | ICD-10-CM

## 2020-10-15 DIAGNOSIS — I361 Nonrheumatic tricuspid (valve) insufficiency: Secondary | ICD-10-CM | POA: Diagnosis not present

## 2020-10-15 DIAGNOSIS — I340 Nonrheumatic mitral (valve) insufficiency: Secondary | ICD-10-CM | POA: Diagnosis not present

## 2020-10-15 DIAGNOSIS — R6520 Severe sepsis without septic shock: Secondary | ICD-10-CM | POA: Diagnosis not present

## 2020-10-15 DIAGNOSIS — A419 Sepsis, unspecified organism: Secondary | ICD-10-CM | POA: Diagnosis not present

## 2020-10-15 LAB — BASIC METABOLIC PANEL
Anion gap: 11 (ref 5–15)
Anion gap: 10 (ref 5–15)
BUN: 17 mg/dL (ref 8–23)
BUN: 9 mg/dL (ref 8–23)
CO2: 26 mmol/L (ref 22–32)
CO2: 25 mmol/L (ref 22–32)
Calcium: 8.3 mg/dL — ABNORMAL LOW (ref 8.9–10.3)
Calcium: 8 mg/dL — ABNORMAL LOW (ref 8.9–10.3)
Chloride: 108 mmol/L (ref 98–111)
Chloride: 104 mmol/L (ref 98–111)
Creatinine, Ser: 0.58 mg/dL (ref 0.44–1.00)
Creatinine, Ser: 0.53 mg/dL (ref 0.44–1.00)
GFR, Estimated: >60 mL/min (ref >60)
GFR, Estimated: >60 mL/min (ref >60)
Glucose, Bld: 102 mg/dL — ABNORMAL HIGH (ref 70–99)
Glucose, Bld: 261 mg/dL — ABNORMAL HIGH (ref 70–99)
Potassium: 3 mmol/L — ABNORMAL LOW (ref 3.5–5.1)
Potassium: 4.3 mmol/L (ref 3.5–5.1)
Sodium: 145 mmol/L (ref 135–145)
Sodium: 139 mmol/L (ref 135–145)

## 2020-10-15 LAB — MAGNESIUM
Magnesium: 2.2 mg/dL (ref 1.7–2.4)
Magnesium: 2.2 mg/dL (ref 1.7–2.4)
Magnesium: 2 mg/dL (ref 1.7–2.4)

## 2020-10-15 LAB — GLUCOSE, CAPILLARY
Glucose-Capillary: 112 mg/dL — ABNORMAL HIGH (ref 70–99)
Glucose-Capillary: 123 mg/dL — ABNORMAL HIGH (ref 70–99)
Glucose-Capillary: 149 mg/dL — ABNORMAL HIGH (ref 70–99)

## 2020-10-15 LAB — ECHOCARDIOGRAM LIMITED
AR max vel: 2.84 cm2
AV Area VTI: 2.55 cm2
AV Area mean vel: 2.31 cm2
AV Mean grad: 6 mmHg
AV Peak grad: 9.6 mmHg
Ao pk vel: 1.55 m/s
Area-P 1/2: 2.71 cm2
Height: 64 in
S' Lateral: 2.3 cm
Weight: 1883.61 oz

## 2020-10-15 LAB — PHOSPHORUS
Phosphorus: 1.9 mg/dL — ABNORMAL LOW (ref 2.5–4.6)
Phosphorus: 3.2 mg/dL (ref 2.5–4.6)
Phosphorus: 3.8 mg/dL (ref 2.5–4.6)

## 2020-10-15 LAB — URINE CULTURE: Culture: 10000 — AB

## 2020-10-15 MED ORDER — POTASSIUM CHLORIDE 20 MEQ PO PACK
40.0000 meq | PACK | Freq: Two times a day (BID) | ORAL | Status: DC
  Administered 2020-10-15 (×2): 40 meq
  Filled 2020-10-15: qty 2

## 2020-10-15 MED ORDER — POTASSIUM CHLORIDE 20 MEQ PO PACK
20.0000 meq | PACK | Freq: Once | ORAL | Status: AC
  Administered 2020-10-15: 20 meq
  Filled 2020-10-15: qty 1

## 2020-10-15 MED ORDER — PROSOURCE TF PO LIQD
45.0000 mL | Freq: Two times a day (BID) | ORAL | Status: DC
  Administered 2020-10-15 – 2020-11-02 (×38): 45 mL
  Filled 2020-10-15 (×38): qty 45

## 2020-10-15 MED ORDER — VITAL HIGH PROTEIN PO LIQD: 1000.0000 mL | ORAL | Status: DC

## 2020-10-15 MED ORDER — VITAL 1.5 CAL PO LIQD
1000.0000 mL | ORAL | Status: DC
  Administered 2020-10-15 – 2020-10-22 (×6): 1000 mL

## 2020-10-15 MED ORDER — SODIUM CHLORIDE 0.9% FLUSH
10.0000 mL | Freq: Two times a day (BID) | INTRAVENOUS | Status: DC
  Administered 2020-10-15 – 2020-11-03 (×29): 10 mL

## 2020-10-15 MED ORDER — SODIUM CHLORIDE 0.9% FLUSH: 10.0000 mL | INTRAVENOUS | Status: DC | PRN

## 2020-10-15 MED ORDER — MIDODRINE HCL 5 MG PO TABS
10.0000 mg | ORAL_TABLET | Freq: Three times a day (TID) | ORAL | Status: DC
  Administered 2020-10-15 – 2020-10-16 (×6): 10 mg
  Filled 2020-10-15 (×6): qty 2

## 2020-10-15 MED ORDER — POTASSIUM CHLORIDE 20 MEQ PO PACK
40.0000 meq | PACK | Freq: Two times a day (BID) | ORAL | Status: DC
  Filled 2020-10-15: qty 2

## 2020-10-15 MED ORDER — POTASSIUM PHOSPHATES 15 MMOLE/5ML IV SOLN
30.0000 mmol | Freq: Once | INTRAVENOUS | Status: AC
  Administered 2020-10-15: 30 mmol via INTRAVENOUS
  Filled 2020-10-15: qty 10

## 2020-10-15 MED ORDER — LEVETIRACETAM 100 MG/ML PO SOLN
500.0000 mg | Freq: Two times a day (BID) | ORAL | Status: DC
  Administered 2020-10-15 – 2020-11-02 (×37): 500 mg
  Filled 2020-10-15 (×40): qty 5

## 2020-10-15 NOTE — Progress Notes (Signed)
eLink Physician-Brief Progress Note Patient Name: Janet Gutierrez DOB: [birth year] MRN: [id number]   Date of Service  10/15/2020  HPI/Events of Note  I will need electrolyte replacement protocol order if you want me to replace K 3.0, Phos 1.9     GFR >60 Cr 0.58  eICU Interventions  Mag 2.2, po4 1.9.   CCM electrolyte protocol used for low K/po4.  Notified bed side RN. Has NG.      Intervention Category Intermediate Interventions: Electrolyte abnormality - evaluation and management  Ranee Gosselin 10/15/2020, 4:07 AM

## 2020-10-15 NOTE — Progress Notes (Signed)
SLP Cancellation Note  Patient Details Name: Janet Gutierrez MRN: [id number] DOB: 07/18/1946   Cancelled treatment:       Reason Eval/Treat Not Completed: Patient not medically ready. Per RN, pt still not very alert for PO trials and not following commands. Will continue to follow for readiness to participate in swallowing evaluation.    Mahala Menghini., M.A. CCC-SLP Acute Rehabilitation Services Pager [phone] Office [phone]  10/15/2020, 9:25 AM

## 2020-10-15 NOTE — Progress Notes (Signed)
Daughter in law and son called from cruise ship. Provided them update on patient's continued guarded prognosis. Consent obtained for both cortrak and PICC line after I explained risks and benefits.  Myrla Halsted MD PCCM

## 2020-10-15 NOTE — Progress Notes (Signed)
  Echocardiogram 2D Echocardiogram has been performed.  Janet Gutierrez 10/15/2020, 2:17 PM

## 2020-10-15 NOTE — Progress Notes (Signed)
Paged Critical Care provider.   Patient had a 23 beat run of VTACH/VFIB, VSS.  Does the provider want any labs?

## 2020-10-15 NOTE — Progress Notes (Signed)
New orders received.  Sent BMP, Mag and Phos.

## 2020-10-15 NOTE — Progress Notes (Signed)
Peripherally Inserted Central Catheter Placement  The IV Nurse has discussed with the patient and/or persons authorized to consent for the patient, the purpose of this procedure and the potential benefits and risks involved with this procedure.  The benefits include less needle sticks, lab draws from the catheter, and the patient may be discharged home with the catheter. Risks include, but not limited to, infection, bleeding, blood clot (thrombus formation), and puncture of an artery; nerve damage and irregular heartbeat and possibility to perform a PICC exchange if needed/ordered by physician.  Alternatives to this procedure were also discussed.  Bard Power PICC patient education guide, fact sheet on infection prevention and patient information card has been provided to patient /or left at bedside.    PICC Placement Documentation  PICC Double Lumen 10/15/20 PICC Left Basilic 43 cm 0 cm (Active)  Indication for Insertion or Continuance of Line Vasoactive infusions;Limited venous access - need for IV therapy >5 days (PICC only) 10/15/20 1327  Exposed Catheter (cm) 0 cm 10/15/20 1327  Site Assessment Clean;Dry;Intact 10/15/20 1327  Lumen #1 Status Flushed;Saline locked;Blood return noted 10/15/20 1327  Lumen #2 Status Flushed;Saline locked;Blood return noted 10/15/20 1327  Dressing Type Transparent 10/15/20 1327  Dressing Status Clean;Dry;Intact 10/15/20 1327  Antimicrobial disc in place? Yes 10/15/20 1327  Safety Lock Not Applicable 10/15/20 1327  Line Care Connections checked and tightened 10/15/20 1327  Line Adjustment (NICU/IV Team Only) No 10/15/20 1327  Dressing Intervention New dressing 10/15/20 1327  Dressing Change Due 10/22/20 10/15/20 1327       Stacie Glaze Horton 10/15/2020, 1:30 PM

## 2020-10-15 NOTE — Progress Notes (Signed)
 NAME:  Janet Gutierrez, MRN:  [id number], DOB:  06/03/1946, LOS: 2 ADMISSION DATE:  10/13/2020, CONSULTATION DATE:  10/13/2020 REFERRING MD:  Dr. Curatolo, CHIEF COMPLAINT:  AMS/ hypotension  Brief History:  74 year old female w/ hx of vascular dementia, seizures, known COVID positive sent from assisted living with AMS, difficulty swallowing, hypoxia, and fever.  Found to have pneumonia and possible UTI, ongoing hypotension in ER despite IVFs.  PCCM consulted for ICU admit.   History of Present Illness:  HPI obtained from medical chart review as patient is encephalopathic at baseline.  Unable to reach son by phone.     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  She is also COVID positive, tested positive 12/29.    She reportedly lives with her son at home, who is her primary caregiver in the evening and weekends and goes PACE during the day.  Reported she needs full assistance for her ADLs.  Currently, son is out of town in [place] on vacation.  In ER, patient febrile, tachycardic, hypotensive in the 70's, and requiring 2L [place].  Her GCS has been around 7, unclear what her true baseline is but thought to be near baseline and non- verbal.  Labs noted for BUN 28, AG 16, alk phos 138, albumin 2.2, AST 42, LDH 221, PCT 2.32, WBC 15.7, Hgb 11.5, HCT 33.8, INR 1.4, COVID positive, CXR showing left lower infiltrate and pleural effusion, UA with trace leukocytes.  CTH without acute findings. Received 3L fluids in ER with some improvement in blood pressure but MAP remains marginal.  Cultures sent and started on cefepime and vancomycin.  Mental status has improved mildly in ER, but still not following commands or verbal.  EDP was able to talk with son previously who verified he wants everything done for this mother.   PCCM called for possible ICU  admit.   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 No acute findings. 2. Mild atrophic change and chronic ischemic microvascular disease. 3. Stable right occipital craniectomy with underlying encephalomalacia over the right cerebellar hemisphere. 4. Minimal chronic sinus inflammatory change.  Micro Data:  1/15 SARS >> positive  1/15 BCx 2 >> 1/15 UC >>  Antimicrobials:  1/15 cefepime >> 1/15 vancomycin >> 1/15 MRSA  Interim History / Subjective:  Now on pressors. Remains poorly responsive. DNR after discussion between Dr. Byrum and son.  Objective   Blood pressure 109/60, pulse (!) 51, temperature 98.42 F (36.9 C), resp. rate 14, height 5' 4" (1.626 m), weight 53.4 kg, SpO2 99 %.    FiO2 (%):  [100 %] 100 %   Intake/Output Summary (Last 24 hours) at 10/15/2020 0741 Last data filed at 10/15/2020 0700 Gross per 24 hour  Intake 1299.12 ml  Output 1250 ml  Net 49.12 ml   Filed Weights   10/13/20 1322 10/14/20 0027 10/15/20 0415  Weight: 61.2 kg 52.3 kg 53.4 kg   Examination: General:  Chronically ill, cachetic elderly female in no distress in bed, on left side HEENT: MM pink/extremely dry, chapped lips Neuro: Will open eyes and move extremities spont but doesn't f/c.  Blinks to threat, +cough and gag    CV: rr, no mumur, +2 radial pulses PULM:  Non labored, on 3L [place], weak wet cough- scattered rhonchi, diminished in left base GI: soft, bs active  Extremities: warm/dry, no LE edema  Skin: no rashes, sacrum not visualized  Resolved Hospital Problem list    Assessment & Plan:   Septic Shock secondary to LL pneumonia, likely aspiration- urine also growing multiple staph species. Acute hypoxemic respiratory failure- due to aspiration Chronic aspiration syndrome COVID in Dec- still on  airborne per infection prevention as we do not have records of original infection - Limited echo - Continue vanc/zosyn - Continue peripheral levophed and dopamine for now - PICC consult - Start TF (cortrak if able, otherwise use NGT)  Acute on chronic  Encephalopathy, secondary to sepsis, history of CVA with end stage vascular dementia, history of seizure disorder - No active seizures on EEG - Cefepime changed to zosyn - Monitor clinically - Continue PTA AEDs   Frail elderly approaching end of life- complicated by POA son being on cruise, will update as able  Building surveyor (evaluated daily)  Diet: TF Pain/Anxiety/Delirium protocol (if indicated): n/a VAP protocol (if indicated): n/a DVT prophylaxis: heparin SQ/ SCDs GI prophylaxis: n/a Glucose control: trend on BMET Mobility:  BR Disposition: ICU pending pressor liberation  Goals of Care:  See note from Dr. Lamonte Sakai 10/14/20   Patient critically ill due to shock, encephalopathy Interventions to address this today continued supportive care, pressor titration Risk of deterioration without these interventions is high  I personally spent 34 minutes providing critical care not including any separately billable procedures  Erskine Emery MD [facility] 10/15/2020 8:00 AM Personal pager: [phone] If unanswered, please page CCM On-call: [phone]

## 2020-10-15 NOTE — Plan of Care (Signed)

## 2020-10-15 NOTE — Progress Notes (Signed)
Initial Nutrition Assessment  DOCUMENTATION CODES:   Not applicable  INTERVENTION:   Plan transition from NG to Cortrak tomorrow  Monitor magnesium, potassium, and phosphorus daily for at least 3 days, MD to replete as needed, as pt is at risk for refeeding syndrome.   Tube feeding:  -Vital 1.5 @ 20 ml/hr via NG -Increase by 10 ml Q8 hours to goal rate of 45 ml/hr (1080 ml) -ProSource TF 45 ml BID  Provides: 1700 kcals, 95 grams protein, 825 ml free water.   NUTRITION DIAGNOSIS:   Increased nutrient needs related to acute illness as evidenced by estimated needs.  GOAL:   Patient will meet greater than or equal to 90% of their needs  MONITOR:   Diet advancement,Skin,TF tolerance,Weight trends,Labs,I & O's  REASON FOR ASSESSMENT:   Consult Assessment of nutrition requirement/status  ASSESSMENT:   Patient with PMH significant for CVA with residual R hemiparesis, seizure disorder, vascular dementia, and known COVID positive (09/26/21). Presents this admission with PNA and possible UTI.   Requiring pressors. Made DNR. Unable to obtain nutrition or weight history at this time. SLP to see once mental status improves. Xray confirms 14 fr NG located in second portion of duodenum. Titrate TF to goal slowly as pt is at risk for refeeding.   Records lack weight documentation over the last year. Utilize 52.3 kg as EDW for now. Suspect malnutrition but unable to make official diagnosis without NFPE.   UOP: 1300 ml x 24 hrs   Drips: dopamine, levophed, potassium phosphate  Labs: K 3.0 (L) Phosphorus 1.9 (L) CBG 83-112  Diet Order:   Diet Order            Diet NPO time specified  Diet effective now                 EDUCATION NEEDS:   Not appropriate for education at this time  Skin:  Skin Assessment: Reviewed RN Assessment  Last BM:  1/16  Height:   Ht Readings from Last 1 Encounters:  10/14/20 5\' 4"  (1.626 m)    Weight:   Wt Readings from Last 1 Encounters:   10/15/20 53.4 kg    BMI:  Body mass index is 20.21 kg/m.  Estimated Nutritional Needs:   Kcal:  1600-1900 kcal  Protein:  85-100 grams  Fluid:  >/= 1.6 L/day  10/17/20 RD, LDN Clinical Nutrition Pager listed in AMION

## 2020-10-15 NOTE — Progress Notes (Addendum)
Patient has order to have a PICC line placed.  Instructed to call local son to obtain consent.  Daughter is currently out of town.  Montrell, local son, phone number is out of order.  There are no other notes with local son alternative number.  Will inform vascular nurse.

## 2020-10-16 ENCOUNTER — Inpatient Hospital Stay (HOSPITAL_COMMUNITY): Payer: Medicare (Managed Care)

## 2020-10-16 DIAGNOSIS — R400 Somnolence: Secondary | ICD-10-CM | POA: Diagnosis not present

## 2020-10-16 DIAGNOSIS — A419 Sepsis, unspecified organism: Secondary | ICD-10-CM | POA: Diagnosis not present

## 2020-10-16 DIAGNOSIS — R6520 Severe sepsis without septic shock: Secondary | ICD-10-CM | POA: Diagnosis not present

## 2020-10-16 LAB — GLUCOSE, CAPILLARY
Glucose-Capillary: 165 mg/dL — ABNORMAL HIGH (ref 70–99)
Glucose-Capillary: 133 mg/dL — ABNORMAL HIGH (ref 70–99)
Glucose-Capillary: 168 mg/dL — ABNORMAL HIGH (ref 70–99)
Glucose-Capillary: 129 mg/dL — ABNORMAL HIGH (ref 70–99)

## 2020-10-16 LAB — BASIC METABOLIC PANEL
Anion gap: 8 (ref 5–15)
BUN: 12 mg/dL (ref 8–23)
CO2: 27 mmol/L (ref 22–32)
Calcium: 8.1 mg/dL — ABNORMAL LOW (ref 8.9–10.3)
Chloride: 103 mmol/L (ref 98–111)
Creatinine, Ser: 0.49 mg/dL (ref 0.44–1.00)
GFR, Estimated: >60 mL/min (ref >60)
Glucose, Bld: 228 mg/dL — ABNORMAL HIGH (ref 70–99)
Potassium: 3.7 mmol/L (ref 3.5–5.1)
Sodium: 138 mmol/L (ref 135–145)

## 2020-10-16 LAB — MAGNESIUM
Magnesium: 2 mg/dL (ref 1.7–2.4)
Magnesium: 1.9 mg/dL (ref 1.7–2.4)

## 2020-10-16 LAB — PHOSPHORUS
Phosphorus: 1.8 mg/dL — ABNORMAL LOW (ref 2.5–4.6)
Phosphorus: 2.7 mg/dL (ref 2.5–4.6)

## 2020-10-16 MED ORDER — POTASSIUM CHLORIDE 20 MEQ PO PACK
40.0000 meq | PACK | Freq: Once | ORAL | Status: AC
  Administered 2020-10-16: 40 meq
  Filled 2020-10-16: qty 2

## 2020-10-16 MED ORDER — POTASSIUM PHOSPHATES 15 MMOLE/5ML IV SOLN
30.0000 mmol | Freq: Once | INTRAVENOUS | Status: AC
  Administered 2020-10-16: 30 mmol via INTRAVENOUS
  Filled 2020-10-16: qty 10

## 2020-10-16 NOTE — Progress Notes (Signed)
SLP Cancellation Note  Patient Details Name: Janet Gutierrez MRN: [id number] DOB: 01-22-1946   Cancelled evaluation:    Pt lethargic and not sufficiently alert to participate. RN suggested that pt has been more alert in am.  Will continue efforts.  [name] [name], MA CCC/SLP Acute Rehabilitation Services Office number [phone] Pager [phone]        [name] [name] 10/16/2020, 2:46 PM

## 2020-10-16 NOTE — Procedures (Signed)
Cortrak  Person Inserting Tube:  Myli Pae, RD Tube Type:  Cortrak - 43 inches Tube Location:  Right nare Initial Placement:  Stomach Secured by: Bridle Technique Used to Measure Tube Placement:  Documented cm marking at nare/ corner of mouth Cortrak Secured At:  63 cm   No x-ray is required. RN may begin using tube.   If the tube becomes dislodged please keep the tube and contact the Cortrak team at [email or web address] ([email or web address]) for replacement.  If after hours and replacement cannot be delayed, place a NG tube and confirm placement with an abdominal x-ray.    Lilyahna Sirmon RD, LDN Clinical Nutrition Pager listed in AMION    

## 2020-10-16 NOTE — Progress Notes (Signed)
NAME:  Janet Gutierrez, MRN:  [id number], DOB:  1946/07/19, LOS: 3 ADMISSION DATE:  10/13/2020, CONSULTATION DATE:  10/13/2020 REFERRING MD:  Dr. Ronnald Nian, CHIEF COMPLAINT:  AMS/ hypotension  Brief History:  75 year old female w/ hx of vascular dementia, seizures, known COVID positive sent from assisted living with AMS, difficulty swallowing, hypoxia, and fever.  Found to have pneumonia and possible UTI, ongoing hypotension in ER despite IVFs.  PCCM consulted for ICU admit.   History of Present Illness:  HPI obtained from medical chart review as patient is encephalopathic at baseline.  Unable to reach son by phone.     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  She is also COVID positive, tested positive 12/29.    She reportedly lives with her son at home, who is her primary caregiver in the evening and weekends and goes PACE during the day.  Reported she needs full assistance for her ADLs.  Currently, son is out of town in [place] on vacation.  In ER, patient febrile, tachycardic, hypotensive in the 70's, and requiring 2L [place].  Her GCS has been around 7, unclear what her true baseline is but thought to be near baseline and non- verbal.  Labs noted for BUN 28, AG 16, alk phos 138, albumin 2.2, AST 42, LDH 221, PCT 2.32, WBC 15.7, Hgb 11.5, HCT 33.8, INR 1.4, COVID positive, CXR showing left lower infiltrate and pleural effusion, UA with trace leukocytes.  [place] without acute findings. Received 3L fluids in ER with some improvement in blood pressure but MAP remains marginal.  Cultures sent and started on cefepime and vancomycin.  Mental status has improved mildly in ER, but still not following commands or verbal.  EDP was able to talk with son previously who verified he wants everything done for this mother.   PCCM called for possible ICU  admit.   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 No acute findings. 2. Mild atrophic change and chronic ischemic microvascular disease. 3. Stable right occipital craniectomy with underlying encephalomalacia over the right cerebellar hemisphere. 4. Minimal chronic sinus inflammatory change.  Micro Data:  1/15 SARS >> positive  1/15 BCx 2 >> 1/15 UC >>  Antimicrobials:  1/15 cefepime >> 1/15 vancomycin >> 1/15 MRSA  Interim History / Subjective:  Remains on pressors. Looks at me but no verbal response.  Objective   Blood pressure 124/66, pulse (!) 59, temperature 99.5 F (37.5 C), resp. rate 17, height 5' 4"  (1.626 m), weight 51 kg, SpO2 100 %.        Intake/Output Summary (Last 24 hours) at 10/16/2020 0739 Last data filed at 10/16/2020 0700 Gross per 24 hour  Intake 1223.91 ml  Output 2150 ml  Net -926.09 ml   Filed Weights   10/14/20 0027 10/15/20 0415 10/16/20 0600  Weight: 52.3 kg 53.4 kg 51 kg   Examination: Constitutional: frail elderly woman lying in bed  Eyes: pupils equal, occasional tracking which is an improvement Ears, nose, mouth, and throat: MM dry , trachea midline Cardiovascular: RRR, +SEM Respiratory: scattered upper airway sounds, no accessory muscle use Gastrointestinal: soft, +BS Skin: No rashes, normal turgor Neurologic: contracted,  increased muscle throughout Psychiatric: RASS 0   Resolved Hospital Problem list    Assessment & Plan:   Septic Shock secondary to LL pneumonia, likely aspiration- urine also growing multiple staph species. Acute hypoxemic respiratory failure- due to aspiration, improved Chronic aspiration syndrome COVID in Dec- still on airborne per infection prevention as we do not have records of original infection - Limited echo:  moderate pericardial effusion, no tamponade - Continue zosyn x 7 days, d/c vanc - Wean levophed - Start TF (cortrak if able)  Acute on chronic  Encephalopathy, secondary to sepsis, history of CVA with end stage vascular dementia, history of seizure disorder - No active seizures on EEG - Monitor clinically - Continue PTA AEDs - Overall I think she is improved in this respect, will need family to better understand her baseline   Frail elderly approaching end of life- complicated by POA son being on cruise, will update as able, updated last 1/17  Best practice (evaluated daily)  Diet: TF Pain/Anxiety/Delirium protocol (if indicated): n/a VAP protocol (if indicated): n/a DVT prophylaxis: heparin SQ/ SCDs GI prophylaxis: n/a Glucose control: trend on BMET Mobility:  BR Disposition: ICU pending pressor liberation  Goals of Care:  Confirmed DNR 1/17  Patient critically ill due to shock, encephalopathy Interventions to address this today continued supportive care, pressor titration Risk of deterioration without these interventions is high  I personally spent 32 minutes providing critical care not including any separately billable procedures  Erskine Emery MD [facility] 10/16/2020 7:39 AM Personal pager: [phone] If unanswered, please page CCM On-call: [phone]

## 2020-10-16 NOTE — Progress Notes (Signed)
Pharmacy Antibiotic Note  Janet Gutierrez is a 75 y.o. female admitted on 10/13/2020 with sepsis, suspected.  Pharmacy has been consulted for Zosyn dosing.   Vancomycin stopped today.  Plan: Continue Zosyn 3.375g IV q 8hrs.  Planning 7 day course which would end 1/22.  Height: 5\' 4"  (162.6 cm) Weight: 51 kg (112 lb 7 oz) IBW/kg (Calculated) : 54.7  Temp (24hrs), Avg:99.4 F (37.4 C), Min:97.7 F (36.5 C), Max:100.94 F (38.3 C)  Recent Labs  Lab 10/13/20 1350 10/13/20 1628 10/14/20 0109 10/14/20 0601 10/14/20 0923 10/15/20 0123 10/15/20 1620 10/16/20 0302  WBC 15.7*  --  17.3*  --   --   --   --   --   CREATININE 0.94  --  0.56  --   --  0.58 0.53 0.49  LATICACIDVEN 1.3 1.4  --  1.2 1.0  --   --   --     Estimated Creatinine Clearance: 51.2 mL/min (by C-G formula based on SCr of 0.49 mg/dL).    No Known Allergies  Antimicrobials this admission: Cefepime 1/15 > 1/16 Zosyn 1/16 > (1/22) Vanc 1/16 > 1/18  Dose adjustments this admission:  Microbiology results: 1/15 Bcx: ngtd 1/15 Ucx: 10K strep agalactiae; 60K staph simulans  Thank you for allowing pharmacy to be a part of this patient's care.  2/15, Reece Leader, [facility] Clinical Pharmacist  10/16/2020 9:43 AM   [facility] pharmacy phone numbers are listed on [email or web address]

## 2020-10-16 NOTE — Progress Notes (Signed)
1709 Administered tylenol for fever  101.1.   1800 Temp 100.6

## 2020-10-16 NOTE — Plan of Care (Signed)

## 2020-10-17 DIAGNOSIS — R579 Shock, unspecified: Secondary | ICD-10-CM | POA: Diagnosis not present

## 2020-10-17 DIAGNOSIS — R6520 Severe sepsis without septic shock: Secondary | ICD-10-CM | POA: Diagnosis not present

## 2020-10-17 DIAGNOSIS — A419 Sepsis, unspecified organism: Secondary | ICD-10-CM | POA: Diagnosis not present

## 2020-10-17 DIAGNOSIS — R400 Somnolence: Secondary | ICD-10-CM | POA: Diagnosis not present

## 2020-10-17 LAB — BASIC METABOLIC PANEL
Anion gap: 10 (ref 5–15)
BUN: 12 mg/dL (ref 8–23)
CO2: 28 mmol/L (ref 22–32)
Calcium: 8 mg/dL — ABNORMAL LOW (ref 8.9–10.3)
Chloride: 102 mmol/L (ref 98–111)
Creatinine, Ser: 0.48 mg/dL (ref 0.44–1.00)
GFR, Estimated: >60 mL/min (ref >60)
Glucose, Bld: 203 mg/dL — ABNORMAL HIGH (ref 70–99)
Potassium: 3.5 mmol/L (ref 3.5–5.1)
Sodium: 140 mmol/L (ref 135–145)

## 2020-10-17 LAB — GLUCOSE, CAPILLARY
Glucose-Capillary: 113 mg/dL — ABNORMAL HIGH (ref 70–99)
Glucose-Capillary: 117 mg/dL — ABNORMAL HIGH (ref 70–99)
Glucose-Capillary: 117 mg/dL — ABNORMAL HIGH (ref 70–99)
Glucose-Capillary: 134 mg/dL — ABNORMAL HIGH (ref 70–99)

## 2020-10-17 MED ORDER — MIDODRINE HCL 5 MG PO TABS
5.0000 mg | ORAL_TABLET | Freq: Three times a day (TID) | ORAL | Status: DC
  Administered 2020-10-17 – 2020-10-18 (×6): 5 mg
  Filled 2020-10-17 (×8): qty 1

## 2020-10-17 MED ORDER — ORAL CARE MOUTH RINSE
15.0000 mL | OROMUCOSAL | Status: DC
  Administered 2020-10-17 – 2020-10-24 (×67): 15 mL via OROMUCOSAL

## 2020-10-17 MED ORDER — CHLORHEXIDINE GLUCONATE 0.12% ORAL RINSE (MEDLINE KIT)
15.0000 mL | Freq: Two times a day (BID) | OROMUCOSAL | Status: DC
  Administered 2020-10-17 – 2020-11-03 (×32): 15 mL via OROMUCOSAL
  Filled 2020-10-17: qty 15

## 2020-10-17 MED ORDER — POTASSIUM CHLORIDE 20 MEQ PO PACK
20.0000 meq | PACK | Freq: Once | ORAL | Status: AC
  Administered 2020-10-17: 20 meq
  Filled 2020-10-17: qty 1

## 2020-10-17 NOTE — Evaluation (Signed)
Physical Therapy Evaluation Patient Details Name: Janet Gutierrez MRN: [id number] DOB: [birth year] Today's Date: 10/17/2020   History of Present Illness  75 y.o. female with medical history significant of seizure disorder, vascular dementia, dysphagia, HTN hemorrhagic stroke at age 89 with subsequent right hemiparesis and craniotomy who presented to ED with altered mental status. Known covid positive status 2+ weeks ago. Pt from [place] where she had a fever, with decreased responsiveness and inability to swallow. Only at [facility] for respite care as sone and wife her main caregivers went on a cruise. Admitted 10/13/20 with altered mental status/toxic metabolic encephalopathy in the setting of COVID-19 infection and possible sepsis EEG suggestive of toxic metabolic encephalopathy  Clinical Impression  Pt leaned over on R side on entry with eyes open. Pt son Montrell in room during evaluation, able to provide some information. That pt lives with his brother and sister in-law and that they are her primary caregivers. Pt was at [facility] for respite care while they were able to go on a cruise. Montrell, has not seen his mother in 3 years so he can not speak to her PLOF immediately prior to hospitalization. Pt able to follow one command to squeeze with her L hand, other than that she had her eyes closed. Pt with decreased PROM in UE and LE with tone noted R>L. Pt requires total A to sit on the EoB. Tolerated about 5 seconds and then started retropulsion to return to supine. PT recommending SNF level rehab at discharge. Will need more information from her other son.     Follow Up Recommendations SNF    Equipment Recommendations  Other (comment) (TBD)    Recommendations for Other Services OT consult     Precautions / Restrictions        Mobility  Bed Mobility Overal bed mobility: Needs Assistance Bed Mobility: Supine to Sit;Sit to Supine     Supine to sit: Total assist;HOB elevated Sit  to supine: Total assist;HOB elevated   General bed mobility comments: total A for transfer to EOB, once there sat for about 5 sec and then began retropulsion, requires totalA to return to supine        Balance Overall balance assessment: Needs assistance   Sitting balance-Leahy Scale: Zero                                       Pertinent Vitals/Pain Pain Assessment: Faces Faces Pain Scale: Hurts a little bit    Home Living Family/patient expects to be discharged to:: Skilled nursing facility                      Prior Function Level of Independence: Needs assistance (unsure of level)         Comments: son Montrell in room during Evaluation, states that pt lives with his brother and wife and that she was at [facility] for respite care. Montrell has not seen his mother in 3 years so is unable to provide any information of PLOF prior to hospitalization     Hand Dominance   Dominant Hand: Right    Extremity/Trunk Assessment   Upper Extremity Assessment Upper Extremity Assessment: RUE deficits/detail;LUE deficits/detail;Difficult to assess due to impaired cognition RUE Deficits / Details: lacking full PROM, tone with elbow extension and probable contracture in shoulder LUE Deficits / Details: lacking full PROM, tone with elbow extension and shoulder flexion,  Lower Extremity Assessment Lower Extremity Assessment: RLE deficits/detail;LLE deficits/detail;Difficult to assess due to impaired cognition RLE Deficits / Details: decreased PROM, and increased tone LLE Deficits / Details: decreased PROM, and increased tone       Communication   Communication: Expressive difficulties  Cognition Arousal/Alertness: Lethargic Behavior During Therapy: Flat affect Overall Cognitive Status: History of cognitive impairments - at baseline                                 General Comments: initially opens eyes during session and looks at son,  follow one command for squeezing her L hand, unable to follow all additional commands      General Comments General comments (skin integrity, edema, etc.): VSS on 4L O2 via HFNC        Assessment/Plan    PT Assessment Patient needs continued PT services  PT Problem List Decreased strength;Decreased range of motion;Decreased activity tolerance;Decreased balance;Decreased mobility;Decreased coordination;Decreased cognition;Decreased safety awareness       PT Treatment Interventions DME instruction;Functional mobility training;Therapeutic activities;Therapeutic exercise;Balance training;Cognitive remediation;Patient/family education    PT Goals (Current goals can be found in the Care Plan section)  Acute Rehab PT Goals Patient Stated Goal: none stated PT Goal Formulation: Patient unable to participate in goal setting Time For Goal Achievement: 10/31/20 Potential to Achieve Goals: Poor    Frequency Min 2X/week    AM-PAC PT "6 Clicks" Mobility  Outcome Measure Help needed turning from your back to your side while in a flat bed without using bedrails?: Total Help needed moving from lying on your back to sitting on the side of a flat bed without using bedrails?: Total Help needed moving to and from a bed to a chair (including a wheelchair)?: Total Help needed standing up from a chair using your arms (e.g., wheelchair or bedside chair)?: Total Help needed to walk in hospital room?: Total Help needed climbing 3-5 steps with a railing? : Total 6 Click Score: 6    End of Session Equipment Utilized During Treatment: Oxygen Activity Tolerance: Other (comment);Patient limited by lethargy (decreased command follow) Patient left: in chair;with call bell/phone within reach;with family/visitor present Nurse Communication: Mobility status PT Visit Diagnosis: Hemiplegia and hemiparesis;Difficulty in walking, not elsewhere classified (R26.2);Other symptoms and signs involving the nervous system  (R29.898);Adult, failure to thrive (R62.7) Hemiplegia - Right/Left: Right Hemiplegia - dominant/non-dominant: Dominant Hemiplegia - caused by: Nontraumatic intracerebral hemorrhage (in her 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 Beverely Risen PT, DPT Acute Rehabilitation Services Pager [phone] Office [phone]   Elon Alas Fleet 10/17/2020, 3:37 PM

## 2020-10-17 NOTE — Progress Notes (Signed)
PT Cancellation Note  Patient Details Name: Janet Gutierrez MRN: [id number] DOB: September 09, 1946   Cancelled Treatment:    Reason Eval/Treat Not Completed: Other (comment) (Nurse wanted to check with MD regarding goals of care as pt is questionably end of life per nurse.  Will return in pm as able.)   Berline Lopes 10/17/2020, 9:15 AM Telvin Reinders W,PT Acute Rehabilitation Services Pager:  [phone]  Office:  [phone]

## 2020-10-17 NOTE — Evaluation (Addendum)
Clinical/Bedside Swallow Evaluation Patient Details  Name: Janet Gutierrez MRN: [id number] Date of Birth: [birth year]  Today's Date: 10/17/2020 Time: SLP Start Time (ACUTE ONLY): 0912 SLP Stop Time (ACUTE ONLY): 0924 SLP Time Calculation (min) (ACUTE ONLY): 12 min  Past Medical History:  Past Medical History:  Diagnosis Date  . 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 Dementia [facility])    Past Surgical History:  Past Surgical History:  Procedure Laterality Date  . BRAIN SURGERY     age 29   HPI:  75 year old female w/ hx of vascular dementia, seizures, known COVID positive sent from assisted living with AMS, difficulty swallowing, hypoxia, and fever.  Found to have pneumonia and possible UTI, ongoing hypotension in ER despite IVFs.  Hx CVA at age 21. Now with septic shock secondary to pna, likely aspiration; encephalopathy.   Assessment / Plan / Recommendation Clinical Impression  Covid precautions lifted this morning. She was sleeping, open mouth posture with visible (mild) secretions on lips and soft palate. During oral hygiene she opened eyes briefly, moving lips and tongue moreso in reflexive response to [place] than following commands volitionally when requested to protruce tongue and open mouth. Increased production of saliva during oral care that required suctioning. Alertness and awareness were not adequate to provide actual liquids or solids. She is on HFNC to 12 L. ST services will intervene briefly for any progress and ability for po's. Continue oral care and moisture. SLP Visit Diagnosis: Dysphagia, unspecified (R13.10)    Aspiration Risk  Severe aspiration risk    Diet Recommendation NPO   Medication Administration: Via alternative means    Other  Recommendations Oral Care Recommendations: Oral care QID   Follow up Recommendations  (TBD)      Frequency and Duration min 1 x/week  2 weeks        Prognosis Prognosis for Safe Diet Advancement: Fair Barriers to Reach Goals: Severity of deficits      Swallow Study   General Date of Onset: 10/14/20 HPI: 75 year old female w/ hx of vascular dementia, seizures, known COVID positive sent from assisted living with AMS, difficulty swallowing, hypoxia, and fever.  Found to have pneumonia and possible UTI, ongoing hypotension in ER despite IVFs.  Hx CVA at age 46. Now with septic shock secondary to pna, likely aspiration; encephalopathy. Type of Study: Bedside Swallow Evaluation Previous Swallow Assessment: MBS 04/16/20:  Pt demonstrates a a primary oral dyspahgia with lingual pumping and thrusting mechanism with premature spillage of 100% of the bolus tot he pharynx prior to the swallow. Regardless of significant pooling in the pyriforms and rapid intake, there was no penetration or aspiration. Despite missing dentition, all solid textures were trialed given no information provided on pts baseline diet. Pt was able to orally manipulate and prepare a graham cracker with gums and also peaches with only mild oral residue, which cleared with a liquid wash. Suggest continuing thin liquids, a soft chopped diet could be considered if textures are consistently small and soft. Large chunks of meat, vegetables and fruit would not be appropriate. Esophageal sweep WNL. Diet Prior to this Study: NPO;NG Tube Temperature Spikes Noted: No Respiratory Status: Other (comment) (HFNC 12) History of Recent Intubation: No Behavior/Cognition: Requires cueing;Doesn't follow directions;Lethargic/Drowsy Oral Cavity Assessment: Dry;Other (comment) (lingual candidias) Oral Care Completed by SLP: Yes Vision: Impaired for self-feeding Self-Feeding Abilities: Total assist Patient Positioning: Upright in bed Baseline Vocal  Quality: Low vocal intensity Volitional Cough: Cognitively unable to elicit Volitional Swallow: Unable to elicit    Oral/Motor/Sensory Function Overall  Oral Motor/Sensory Function: Generalized oral weakness (decreased ROM, hypotonia)   Ice Chips Ice chips: Not tested   Thin Liquid Thin Liquid: Not tested    Nectar Thick Nectar Thick Liquid: Not tested   Honey Thick Honey Thick Liquid: Not tested   Puree Puree: Not tested   Solid     Solid: Not tested      Royce Macadamia 10/17/2020,9:53 AM Breck Coons Lonell Face.Ed Nurse, children's [phone] Office [phone]

## 2020-10-17 NOTE — Progress Notes (Signed)
Apparently son called back and patient is now full code.  He will return from cruise tomorrow.  Myrla Halsted MD PCCM

## 2020-10-17 NOTE — Progress Notes (Signed)
NAME:  Janet Gutierrez, MRN:  [id number], DOB:  1946-06-30, LOS: 4 ADMISSION DATE:  10/13/2020, CONSULTATION DATE:  10/13/2020 REFERRING MD:  Dr. Ronnald Nian, CHIEF COMPLAINT:  AMS/ hypotension  Brief History:  75 year old female w/ hx of vascular dementia, seizures, known COVID positive sent from assisted living with AMS, difficulty swallowing, hypoxia, and fever.  Found to have pneumonia and possible UTI, ongoing hypotension in ER despite IVFs.  PCCM consulted for ICU admit.   History of Present Illness:  HPI obtained from medical chart review as patient is encephalopathic at baseline.  Unable to reach son by phone.     75 year old female with history of CVA (at 75 yo) with some residual right hemiparesis, seizure disorder, and vascular dementia sent from [facility] for concerns of difficulty swallowing, AMS, and fever.  She is also COVID positive, tested positive 12/29.    She reportedly lives with her son at home, who is her primary caregiver in the evening and weekends and goes PACE during the day.  Reported she needs full assistance for her ADLs.  Currently, son is out of town in [place] on vacation.  In ER, patient febrile, tachycardic, hypotensive in the 70's, and requiring 2L [place].  Her GCS has been around 7, unclear what her true baseline is but thought to be near baseline and non- verbal.  Labs noted for BUN 28, AG 16, alk phos 138, albumin 2.2, AST 42, LDH 221, PCT 2.32, WBC 15.7, Hgb 11.5, HCT 33.8, INR 1.4, COVID positive, CXR showing left lower infiltrate and pleural effusion, UA with trace leukocytes.  [place] without acute findings. Received 3L fluids in ER with some improvement in blood pressure but MAP remains marginal.  Cultures sent and started on cefepime and vancomycin.  Mental status has improved mildly in ER, but still not following commands or verbal.  EDP was able to talk with son previously who verified he wants everything done for this mother.   PCCM called for possible ICU  admit.   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 No acute findings. 2. Mild atrophic change and chronic ischemic microvascular disease. 3. Stable right occipital craniectomy with underlying encephalomalacia over the right cerebellar hemisphere. 4. Minimal chronic sinus inflammatory change.  Micro Data:  1/15 SARS >> positive  1/15 BCx 2 >> 1/15 UC >>  Antimicrobials:  1/15 cefepime >> 1/15 vancomycin >> 1/15 MRSA  Interim History / Subjective:  Remains on pressors, dopamine. Looks at me if I bother her enough but not following commands.  Objective   Blood pressure (!) 125/58, pulse (!) 54, temperature (!) 97.34 F (36.3 C), resp. rate 14, height 5' 4"  (1.626 m), weight 49.9 kg, SpO2 94 %.        Intake/Output Summary (Last 24 hours) at 10/17/2020 0742 Last data filed at 10/17/2020 0700 Gross per 24 hour  Intake 1733.45 ml  Output 2601 ml  Net -867.55 ml   Filed Weights   10/15/20 0415 10/16/20 0600 10/17/20 0400  Weight: 53.4 kg 51 kg 49.9 kg   Examination: Constitutional: fraily elderly woman contracted in bed  Eyes: EOMI with enough prompting, pupils equal Ears, nose, mouth, and throat: poor dentition, MMM Cardiovascular: bradycardic, +SEM Respiratory: diminished bases, no accessory muscle use Gastrointestinal: soft, +BS Skin: No rashes, normal turgor  Neurologic: contracted, occasionally moves, not to command, stable Psychiatric: RASS 0    Resolved Hospital Problem list    Assessment & Plan:   Septic Shock secondary to LL pneumonia, likely aspiration- urine also growing multiple staph species. Limited echo: moderate pericardial effusion, no tamponade.  Persistent vasoplegia of unclear cause. Acute hypoxemic respiratory failure- due to aspiration, improved. Chronic  aspiration syndrome COVID in Dec- still on airborne per infection prevention as we do not have records of original infection; COVID PCR here still positive Acute on chronic  Encephalopathy, secondary to sepsis, history of CVA with end stage vascular dementia, history of seizure disorder- No active seizures on EEG Frail elderly approaching end of life- complicated by POA son being on cruise, will update as able, updated last 1/17  - Continue zosyn x 7 days - PT if able - Wean levophed, dopamine, drop midodrine a bit to reduce reflex bradycardia - Continue TF - Continue PTA AEDs - Will need family to better understand her baseline  - ICU pending pressor liberation  Best practice (evaluated daily)  Diet: TF Pain/Anxiety/Delirium protocol (if indicated): n/a VAP protocol (if indicated): n/a DVT prophylaxis: heparin SQ/ SCDs GI prophylaxis: n/a Glucose control: trend on BMET Mobility:  BR Disposition: ICU pending pressor liberation  Goals of Care:  Confirmed DNR 1/17  Patient critically ill due to shock, encephalopathy Interventions to address this today continued supportive care, pressor titration Risk of deterioration without these interventions is high  I personally spent 33 minutes providing critical care not including any separately billable procedures  Erskine Emery MD [facility] 10/17/2020 7:42 AM Personal pager: [phone] If unanswered, please page CCM On-call: [phone]

## 2020-10-18 ENCOUNTER — Encounter (HOSPITAL_COMMUNITY): Payer: Self-pay | Admitting: Family Medicine

## 2020-10-18 DIAGNOSIS — R6520 Severe sepsis without septic shock: Secondary | ICD-10-CM | POA: Diagnosis not present

## 2020-10-18 DIAGNOSIS — Z7189 Other specified counseling: Secondary | ICD-10-CM | POA: Diagnosis not present

## 2020-10-18 DIAGNOSIS — R40241 Glasgow coma scale score 13-15, unspecified time: Secondary | ICD-10-CM | POA: Diagnosis not present

## 2020-10-18 DIAGNOSIS — A419 Sepsis, unspecified organism: Secondary | ICD-10-CM | POA: Diagnosis not present

## 2020-10-18 DIAGNOSIS — R579 Shock, unspecified: Secondary | ICD-10-CM | POA: Diagnosis not present

## 2020-10-18 DIAGNOSIS — Z515 Encounter for palliative care: Secondary | ICD-10-CM

## 2020-10-18 DIAGNOSIS — U071 COVID-19: Secondary | ICD-10-CM | POA: Diagnosis not present

## 2020-10-18 DIAGNOSIS — G309 Alzheimer's disease, unspecified: Secondary | ICD-10-CM | POA: Diagnosis not present

## 2020-10-18 LAB — GLUCOSE, CAPILLARY
Glucose-Capillary: 138 mg/dL — ABNORMAL HIGH (ref 70–99)
Glucose-Capillary: 113 mg/dL — ABNORMAL HIGH (ref 70–99)
Glucose-Capillary: 153 mg/dL — ABNORMAL HIGH (ref 70–99)
Glucose-Capillary: 109 mg/dL — ABNORMAL HIGH (ref 70–99)
Glucose-Capillary: 187 mg/dL — ABNORMAL HIGH (ref 70–99)
Glucose-Capillary: 186 mg/dL — ABNORMAL HIGH (ref 70–99)

## 2020-10-18 LAB — BASIC METABOLIC PANEL
Anion gap: 11 (ref 5–15)
BUN: 13 mg/dL (ref 8–23)
CO2: 29 mmol/L (ref 22–32)
Calcium: 8.1 mg/dL — ABNORMAL LOW (ref 8.9–10.3)
Chloride: 98 mmol/L (ref 98–111)
Creatinine, Ser: 0.51 mg/dL (ref 0.44–1.00)
GFR, Estimated: >60 mL/min (ref >60)
Glucose, Bld: 275 mg/dL — ABNORMAL HIGH (ref 70–99)
Potassium: 3.7 mmol/L (ref 3.5–5.1)
Sodium: 138 mmol/L (ref 135–145)

## 2020-10-18 LAB — CULTURE, BLOOD (ROUTINE X 2)
Culture: NO GROWTH
Culture: NO GROWTH
Special Requests: ADEQUATE
Special Requests: ADEQUATE

## 2020-10-18 LAB — MRSA PCR SCREENING: MRSA by PCR: NEGATIVE

## 2020-10-18 MED ORDER — POTASSIUM CHLORIDE CRYS ER 20 MEQ PO TBCR: 40.0000 meq | EXTENDED_RELEASE_TABLET | Freq: Once | ORAL | Status: DC

## 2020-10-18 MED ORDER — HYDROCORTISONE NA SUCCINATE PF 100 MG IJ SOLR
50.0000 mg | Freq: Four times a day (QID) | INTRAMUSCULAR | Status: DC
  Administered 2020-10-18 – 2020-10-23 (×20): 50 mg via INTRAVENOUS
  Filled 2020-10-18 (×20): qty 2

## 2020-10-18 MED ORDER — CHLORHEXIDINE GLUCONATE CLOTH 2 % EX PADS
6.0000 | MEDICATED_PAD | Freq: Every day | CUTANEOUS | Status: DC
  Administered 2020-10-19 – 2020-11-03 (×10): 6 via TOPICAL

## 2020-10-18 MED ORDER — POTASSIUM CHLORIDE 20 MEQ PO PACK
40.0000 meq | PACK | Freq: Once | ORAL | Status: AC
  Administered 2020-10-18: 40 meq
  Filled 2020-10-18: qty 2

## 2020-10-18 NOTE — Progress Notes (Signed)
NAME:  Janet Gutierrez, MRN:  [id number], DOB:  1946/03/24, LOS: 5 ADMISSION DATE:  10/13/2020, CONSULTATION DATE:  10/13/2020 REFERRING MD:  Dr. Ronnald Nian, CHIEF COMPLAINT:  AMS/ hypotension  Brief History:  75 year old female w/ hx of vascular dementia, seizures, known COVID positive sent from assisted living with AMS, difficulty swallowing, hypoxia, and fever.  Found to have pneumonia and possible UTI, ongoing hypotension in ER despite IVFs.  PCCM consulted for ICU admit.   History of Present Illness:  HPI obtained from medical chart review as patient is encephalopathic at baseline.  Unable to reach son by phone.     75 year old female with history of CVA (at 75 yo) with some residual right hemiparesis, seizure disorder, and vascular dementia sent from [facility] for concerns of difficulty swallowing, AMS, and fever.  She is also COVID positive, tested positive 12/29.    She reportedly lives with her son at home, who is her primary caregiver in the evening and weekends and goes PACE during the day.  Reported she needs full assistance for her ADLs.  Currently, son is out of town in [place] on vacation.  In ER, patient febrile, tachycardic, hypotensive in the 70's, and requiring 2L [place].  Her GCS has been around 7, unclear what her true baseline is but thought to be near baseline and non- verbal.  Labs noted for BUN 28, AG 16, alk phos 138, albumin 2.2, AST 42, LDH 221, PCT 2.32, WBC 15.7, Hgb 11.5, HCT 33.8, INR 1.4, COVID positive, CXR showing left lower infiltrate and pleural effusion, UA with trace leukocytes.  [place] without acute findings. Received 3L fluids in ER with some improvement in blood pressure but MAP remains marginal.  Cultures sent and started on cefepime and vancomycin.  Mental status has improved mildly in ER, but still not following commands or verbal.  EDP was able to talk with son previously who verified he wants everything done for this mother.   PCCM called for possible ICU  admit.   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 No acute findings. 2. Mild atrophic change and chronic ischemic microvascular disease. 3. Stable right occipital craniectomy with underlying encephalomalacia over the right cerebellar hemisphere. 4. Minimal chronic sinus inflammatory change.  Micro Data:  1/15 SARS >> positive  1/15 BCx 2 >> 1/15 UC >>  Antimicrobials:  1/15 cefepime >> 1/15 vancomycin >> 1/15 MRSA  Interim History / Subjective:  No events. Remains poorly responsive.  Apparently was able to squeeze hand of PT yesterday.  Objective   Blood pressure 110/72, pulse 68, temperature 98.2 F (36.8 C), temperature source Bladder, resp. rate 18, height 5' 4"  (1.626 m), weight 49.8 kg, SpO2 97 %.        Intake/Output Summary (Last 24 hours) at 10/18/2020 0934 Last data filed at 10/18/2020 0800 Gross per 24 hour  Intake 1451.18 ml  Output 1910 ml  Net -458.82 ml   Filed Weights   10/16/20 0600 10/17/20 0400 10/18/20 0325  Weight: 51 kg 49.9 kg 49.8 kg   Examination: Constitutional: fraily elderly woman contracted in bed  Eyes: EOMI with repeated prompting Ears, nose, mouth, and throat: poor dentition, MMM, trachea midline Cardiovascular: RRR, ext warm Respiratory: diminished bases, no accessory muscle use, now off O2 Gastrointestinal: soft, +BS Skin: No  rashes, normal turgor Neurologic: R>L increased tone, cannot get her to move or follow commands for me, winces to pain Psychiatric: RASS 0    Resolved Hospital Problem list    Assessment & Plan:   Septic Shock secondary to LL pneumonia, likely aspiration- urine also growing multiple staph species. Limited echo: moderate pericardial effusion, no tamponade.   Persistent vasoplegia of unclear cause.  Acute hypoxemic  respiratory failure- due to aspiration, improved. Chronic aspiration syndrome COVID in Dec- still on airborne per infection prevention as we do not have records of original infection; COVID PCR here still positive Acute on chronic  Encephalopathy, secondary to sepsis, history of CVA with end stage vascular dementia, history of seizure disorder- No active seizures on EEG Frail elderly approaching end of life- complicated by POA son being on cruise, will update as able, updated last 1/17  - zosyn x 7 days - Appreciate PT input: SNF recommended - Wean levophed, dopamine, continue dopamine, trial of hydrocortisone - Continue TF - Continue PTA AEDs - Hopefully family can come in today so we can get better idea of baseline and have realistic [place] discussion, I have asked palliative to come assist with this - ICU pending pressor liberation  Best practice (evaluated daily)  Diet: TF Pain/Anxiety/Delirium protocol (if indicated): n/a VAP protocol (if indicated): n/a DVT prophylaxis: heparin SQ/ SCDs GI prophylaxis: n/a Glucose control: trend on BMET Mobility:  BR Disposition: ICU pending pressor liberation and family discussions  Goals of Care:  Code status reversed by son over phone 10/17/20  Patient critically ill due to shock, encephalopathy Interventions to address this today continued supportive care, pressor titration Risk of deterioration without these interventions is high  I personally spent 32 minutes providing critical care not including any separately billable procedures  Erskine Emery MD [facility] 10/18/2020 9:34 AM Personal pager: [phone] If unanswered, please page CCM On-call: [phone]

## 2020-10-18 NOTE — Progress Notes (Signed)
  Speech Language Pathology Treatment: Dysphagia  Patient Details Name: Ruhama Lehew MRN: [id number] DOB: 29-May-1946 Today's Date: 10/18/2020 Time: 9407-6808 SLP Time Calculation (min) (ACUTE ONLY): 14 min  Assessment / Plan / Recommendation Clinical Impression  Pt was largely unresponsive during attempted PO trials despite Max cues through multimodal stimulation from SLP when attempting to make her more arousable. She continued to demonstrate an open mouth posture throughout today's treatment and was not responsive to tactile stimulation using a dry tsp but did reflexively close her mouth around a swab during oral care. Recommend that the pt remain NPO given her current mentation of reduced awareness and alertness.    HPI HPI: 75 year old female w/ hx of vascular dementia, seizures, known COVID positive sent from assisted living with AMS, difficulty swallowing, hypoxia, and fever.  Found to have pneumonia and possible UTI, ongoing hypotension in ER despite IVFs.  Hx CVA at age 71. Now with septic shock secondary to pna, likely aspiration; encephalopathy.      SLP Plan  Continue with current plan of care       Recommendations  Diet recommendations: NPO Medication Administration: Via alternative means                Oral Care Recommendations: Oral care QID SLP Visit Diagnosis: Dysphagia, unspecified (R13.10) Plan: Continue with current plan of care       GO               Zettie Cooley., SLP Student 10/18/2020, 10:20 AM
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
## 2020-10-19 DIAGNOSIS — L8990 Pressure ulcer of unspecified site, unspecified stage: Secondary | ICD-10-CM | POA: Insufficient documentation

## 2020-10-19 DIAGNOSIS — A419 Sepsis, unspecified organism: Secondary | ICD-10-CM | POA: Diagnosis not present

## 2020-10-19 DIAGNOSIS — Z7189 Other specified counseling: Secondary | ICD-10-CM | POA: Diagnosis not present

## 2020-10-19 DIAGNOSIS — R6520 Severe sepsis without septic shock: Secondary | ICD-10-CM | POA: Diagnosis not present

## 2020-10-19 DIAGNOSIS — G309 Alzheimer's disease, unspecified: Secondary | ICD-10-CM | POA: Diagnosis not present

## 2020-10-19 DIAGNOSIS — U071 COVID-19: Secondary | ICD-10-CM | POA: Diagnosis not present

## 2020-10-19 LAB — GLUCOSE, CAPILLARY
Glucose-Capillary: 117 mg/dL — ABNORMAL HIGH (ref 70–99)
Glucose-Capillary: 121 mg/dL — ABNORMAL HIGH (ref 70–99)
Glucose-Capillary: 123 mg/dL — ABNORMAL HIGH (ref 70–99)
Glucose-Capillary: 155 mg/dL — ABNORMAL HIGH (ref 70–99)
Glucose-Capillary: 167 mg/dL — ABNORMAL HIGH (ref 70–99)
Glucose-Capillary: 96 mg/dL (ref 70–99)

## 2020-10-19 LAB — BASIC METABOLIC PANEL
Anion gap: 11 (ref 5–15)
BUN: 20 mg/dL (ref 8–23)
CO2: 31 mmol/L (ref 22–32)
Calcium: 8.7 mg/dL — ABNORMAL LOW (ref 8.9–10.3)
Chloride: 101 mmol/L (ref 98–111)
Creatinine, Ser: 0.45 mg/dL (ref 0.44–1.00)
GFR, Estimated: >60 mL/min (ref >60)
Glucose, Bld: 175 mg/dL — ABNORMAL HIGH (ref 70–99)
Potassium: 3.8 mmol/L (ref 3.5–5.1)
Sodium: 143 mmol/L (ref 135–145)

## 2020-10-19 MED ORDER — MIDODRINE HCL 5 MG PO TABS
10.0000 mg | ORAL_TABLET | Freq: Three times a day (TID) | ORAL | Status: DC
  Administered 2020-10-19 – 2020-10-20 (×6): 10 mg
  Filled 2020-10-19 (×6): qty 2

## 2020-10-19 MED ORDER — INSULIN ASPART 100 UNIT/ML ~~LOC~~ SOLN
0.0000 [IU] | SUBCUTANEOUS | Status: DC
  Administered 2020-10-20 – 2020-11-01 (×19): 1 [IU] via SUBCUTANEOUS

## 2020-10-19 NOTE — Progress Notes (Signed)
NAME:  Janet Gutierrez, MRN:  [id number], DOB:  1945-11-09, LOS: 6 ADMISSION DATE:  10/13/2020, CONSULTATION DATE:  10/13/2020 REFERRING MD:  Dr. Ronnald Nian, CHIEF COMPLAINT:  AMS/ hypotension  Brief History:  75 year old female w/ hx of vascular dementia, seizures, known COVID positive sent from assisted living with AMS, difficulty swallowing, hypoxia, and fever.  Found to have pneumonia and possible UTI, ongoing hypotension in ER despite IVFs.  PCCM consulted for ICU admit.   History of Present Illness:  HPI obtained from medical chart review as patient is encephalopathic at baseline.  Unable to reach son by phone.     75 year old female with history of CVA (at 75 yo) with some residual right hemiparesis, seizure disorder, and vascular dementia sent from [facility] for concerns of difficulty swallowing, AMS, and fever.  She is also COVID positive, tested positive 12/29.    She reportedly lives with her son at home, who is her primary caregiver in the evening and weekends and goes [facility] during the day.  Reported she needs full assistance for her ADLs.  Currently, son is out of town in [place] on vacation.  In ER, patient febrile, tachycardic, hypotensive in the 70's, and requiring 2L [employer].  Her GCS has been around 7, unclear what her true baseline is but thought to be near baseline and non- verbal.  Labs noted for BUN 28, AG 16, alk phos 138, albumin 2.2, AST 42, LDH 221, PCT 2.32, WBC 15.7, Hgb 11.5, HCT 33.8, INR 1.4, COVID positive, CXR showing left lower infiltrate and pleural effusion, UA with trace leukocytes.  [place] without acute findings. Received 3L fluids in ER with some improvement in blood pressure but MAP remains marginal.  Cultures sent and started on cefepime and vancomycin.  Mental status has improved mildly in ER, but still not following commands or verbal.  EDP was able to talk with son previously who verified he wants everything done for this mother.   PCCM called for possible ICU  admit.   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 No acute findings. 2. Mild atrophic change and chronic ischemic microvascular disease. 3. Stable right occipital craniectomy with underlying encephalomalacia over the right cerebellar hemisphere. 4. Minimal chronic sinus inflammatory change.  Micro Data:  1/15 SARS >> positive  1/15 BCx 2 >> 1/15 UC >>  Antimicrobials:  1/15 cefepime  1/15 -16 vancomycin >> 1/15 MRSA Zosyn 1/16-1/20  Interim History / Subjective:  No overnight events Was able to squeeze the nurses hand Not fully following commands  Objective   Blood pressure 100/60, pulse 79, temperature (!) 97.34 F (36.3 C), resp. rate 17, height 5' 4"  (1.626 m), weight 48.2 kg, SpO2 93 %.        Intake/Output Summary (Last 24 hours) at 10/19/2020 0951 Last data filed at 10/19/2020 0900 Gross per 24 hour  Intake 2248.18 ml  Output 2570 ml  Net -321.82 ml   Filed Weights   10/17/20 0400 10/18/20 0325 10/19/20 0300  Weight: 49.9 kg 49.8 kg 48.2 kg   Examination: Constitutional: Frail, elderly lady, does not appear to be in distress Eyes: EOMI with repeated prompting Ears, nose, mouth, and throat: Moist oral mucosa Cardiovascular: S1-S2 appreciated Respiratory: Decreased air entry at the bases, clear Gastrointestinal: Soft Skin: Within normal  limits Neurologic: R>L increased tone, cannot get her to move or follow commands for me, winces to pain Psychiatric: RASS 0    Resolved Hospital Problem list    Assessment & Plan:   Septic shock secondary to left lower lobe pneumonia, likely aspiration -Continue antibiotics at present  Persistent distal plegia of unclear cause -Currently on Levophed and dopamine Being titrated -On midodrine-increase  doses -Increase to 10 , 3 times daily  Acute hypoxemic respiratory failure Secondary to aspiration Chronic aspiration syndrome -Improving  COVID in December COVID PCR was recently positive -Discontinue isolation protocol  Acute on chronic encephalopathy secondary to sepsis Vascular dementia History of seizure disorder -No active seizures on EEG  Approaching end-of-life -Appreciate palliative care consult -Will update son when he is available   Will currently continue Zosyn for 7 days in total-to end on 1/22 Continue tube feeding Continue to try to liberate from intravenous pressors  Best practice (evaluated daily)  Diet: TF Pain/Anxiety/Delirium protocol (if indicated): n/a VAP protocol (if indicated): n/a DVT prophylaxis: heparin SQ/ SCDs GI prophylaxis: n/a Glucose control: trend on BMET Mobility:  BR Disposition: ICU pending pressor liberation and family discussions  Goals of Care:  Code status reversed by son over phone 10/17/20  Patient still in shock, requiring multiple pressors Continue to titrate pressors Risk of deterioration remains very significant  The patient is critically ill with multiple organ systems failure and requires high complexity decision making for assessment and support, frequent evaluation and titration of therapies, application of advanced monitoring technologies and extensive interpretation of multiple databases. Critical Care Time devoted to patient care services described in this note independent of APP/resident time (if applicable)  is 30 minutes.   Sherrilyn Rist MD [facility] Personal pager: [phone] If unanswered, please page CCM On-call: [phone]

## 2020-10-19 NOTE — Plan of Care (Signed)
  Problem: Clinical Measurements: Goal: Ability to maintain clinical measurements within normal limits will improve Outcome: Progressing Goal: Will remain free from infection Outcome: Progressing Goal: Diagnostic test results will improve Outcome: Progressing Goal: Respiratory complications will improve Outcome: Progressing Goal: Cardiovascular complication will be avoided Outcome: Progressing   Problem: Activity: Goal: Risk for activity intolerance will decrease Outcome: Progressing   Problem: Nutrition: Goal: Adequate nutrition will be maintained Outcome: Progressing   Problem: Coping: Goal: Level of anxiety will decrease Outcome: Progressing   Problem: Elimination: Goal: Will not experience complications related to bowel motility Outcome: Progressing Goal: Will not experience complications related to urinary retention Outcome: Progressing   Problem: Pain Managment: Goal: General experience of comfort will improve Outcome: Progressing   Problem: Safety: Goal: Ability to remain free from injury will improve Outcome: Progressing   Problem: Skin Integrity: Goal: Risk for impaired skin integrity will decrease Outcome: Progressing   Problem: Education: Goal: Knowledge of General Education information will improve Description: Including pain rating scale, medication(s)/side effects and non-pharmacologic comfort measures Outcome: Not Progressing   Problem: Health Behavior/Discharge Planning: Goal: Ability to manage health-related needs will improve Outcome: Not Progressing   

## 2020-10-19 NOTE — Progress Notes (Signed)
Nutrition Follow Up  DOCUMENTATION CODES:   Not applicable  INTERVENTION:   Tube feeding:  -Vital 1.5 @ 45 ml/hr via Cortrak (1080 ml) -ProSource TF 45 ml BID  Provides: 1700 kcals, 95 grams protein, 825 ml free water.   NUTRITION DIAGNOSIS:   Increased nutrient needs related to acute illness as evidenced by estimated needs.  Ongoing  GOAL:   Patient will meet greater than or equal to 90% of their needs   Addressed via TF  MONITOR:   Diet advancement,Skin,TF tolerance,Weight trends,Labs,I & O's  REASON FOR ASSESSMENT:   Consult Assessment of nutrition requirement/status  ASSESSMENT:   Patient with PMH significant for CVA with residual R hemiparesis, seizure disorder, vascular dementia, and known COVID positive (09/26/21). Presents this admission with PNA and possible UTI.   1/18- gastric Cortrak placed  Pt discussed during ICU rounds and with RN.   Remains on pressors. Not following commands. Per son, prior to admission they would feed pt by placing spoon in her mouth and she was able to chew food provided. Despite SLP trying this, pt was unable to close mouth to chew/swallow. Remains NPO. Continue with tube feedings at goal. May transition to standard formula upon next follow up.   Admission weight: 61.2 kg  Current weight: 48.2 kg   UOP: 2860 ml x 24 hrs   Drips: dopamine, levophed Medications: solucortef, SS novolog Labs: CBG 121-187  Diet Order:   Diet Order            Diet NPO time specified  Diet effective now                 EDUCATION NEEDS:   Not appropriate for education at this time  Skin:  Skin Assessment: Reviewed RN Assessment  Last BM:  1/20  Height:   Ht Readings from Last 1 Encounters:  10/14/20 5\' 4"  (1.626 m)    Weight:   Wt Readings from Last 1 Encounters:  10/19/20 48.2 kg    BMI:  Body mass index is 18.24 kg/m.  Estimated Nutritional Needs:   Kcal:  1600-1900 kcal  Protein:  85-100 grams  Fluid:  >/= 1.6  L/day  10/21/20 RD, LDN Clinical Nutrition Pager listed in [facility]

## 2020-10-19 NOTE — Progress Notes (Signed)
Palliative:  [name], is lying quietly in bed.  She appears chronically ill and quite frail.  She does not respond to me in any meaningful way.  Her son, Janet Gutierrez, is at bedside.  She does seem to interact more with him, although this does not seem to be purposeful at this time.  Janet Gutierrez tells me that he feels that his mother is close to her baseline.  He states that at home she will keep her head down with her mouth open until someone interacts with her.  He tells me how he feeds her by lifting her chin and touching her bottom lip.  When he does this, she does move her tongue and her mouth is that she is trying to taste something.  Janet Gutierrez shows me pictures of his mother feeding herself with finger foods and holding an Ensure.  We talk in detail about the treatment plan including, but not limited to the use of vasopressors that we are trying to wean to off, IV antibiotics 7-day course to stop 1/22, selected labs.  Janet Gutierrez has no questions about medical care/treatment.  We talk about goals of care, what Janet Gutierrez would and would not want.  Janet Gutierrez shares Janet Gutierrez survival through many medical struggles.  We talked about how to make choices for loved ones including 1) keeping them at the center of decision-making, 2 ) are we doing something for her or to her, 3) the person she was 10 years ago, how would that Janet Gutierrez detail Janet Gutierrez to care for her now.  The [facility] has goals of care meeting with their patients and families every 6 months, including end-of-life/CODE STATUS discussions.  I share that just like I will get sick again, so we will Janet Gutierrez.  I encouraged Janet Gutierrez to think about what this time looks like and feels like for Janet Gutierrez.  We talked about disposition.  At this point, Janet Gutierrez states that his preference is for Janet Gutierrez to return home.  They have the equipment and caregivers needed.  Janet Gutierrez is active with [facility] program, and they would assist in finding  short-term rehab if necessary/qualified.   Conference with CCM attending, bedside nursing staff, transition of care team related to patient condition, needs, goals of care.  Plan: At this point, full scope/full code.  Anticipate home, active with [facility] program.    40 minutes Janet Carmel, NP Palliative medicine team Team phone [phone] Greater than 50% of this time was spent counseling and coordinating care related to the above assessment and plan.

## 2020-10-20 DIAGNOSIS — R6520 Severe sepsis without septic shock: Secondary | ICD-10-CM | POA: Diagnosis not present

## 2020-10-20 DIAGNOSIS — A419 Sepsis, unspecified organism: Secondary | ICD-10-CM | POA: Diagnosis not present

## 2020-10-20 LAB — GLUCOSE, CAPILLARY
Glucose-Capillary: 128 mg/dL — ABNORMAL HIGH (ref 70–99)
Glucose-Capillary: 135 mg/dL — ABNORMAL HIGH (ref 70–99)
Glucose-Capillary: 140 mg/dL — ABNORMAL HIGH (ref 70–99)
Glucose-Capillary: 153 mg/dL — ABNORMAL HIGH (ref 70–99)
Glucose-Capillary: 158 mg/dL — ABNORMAL HIGH (ref 70–99)
Glucose-Capillary: 168 mg/dL — ABNORMAL HIGH (ref 70–99)

## 2020-10-20 LAB — BASIC METABOLIC PANEL
Anion gap: 10 (ref 5–15)
BUN: 26 mg/dL — ABNORMAL HIGH (ref 8–23)
CO2: 31 mmol/L (ref 22–32)
Calcium: 8.5 mg/dL — ABNORMAL LOW (ref 8.9–10.3)
Chloride: 103 mmol/L (ref 98–111)
Creatinine, Ser: 0.49 mg/dL (ref 0.44–1.00)
GFR, Estimated: >60 mL/min (ref >60)
Glucose, Bld: 150 mg/dL — ABNORMAL HIGH (ref 70–99)
Potassium: 3.8 mmol/L (ref 3.5–5.1)
Sodium: 144 mmol/L (ref 135–145)

## 2020-10-20 NOTE — Progress Notes (Signed)
NAME:  Janet Gutierrez, MRN:  [id number], DOB:  08-16-1946, LOS: 7 ADMISSION DATE:  10/13/2020, CONSULTATION DATE:  10/13/2020 REFERRING MD:  Dr. Ronnald Nian, CHIEF COMPLAINT:  AMS/ hypotension  Brief History:  75 year old female w/ hx of vascular dementia, seizures, known COVID positive sent from assisted living with AMS, difficulty swallowing, hypoxia, and fever.  Found to have pneumonia and possible UTI, ongoing hypotension in ER despite IVFs.  PCCM consulted for ICU admit.   History of Present Illness:  HPI obtained from medical chart review as patient is encephalopathic at baseline.  Unable to reach son by phone.     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  She is also COVID positive, tested positive 12/29.    She reportedly lives with her son at home, who is her primary caregiver in the evening and weekends and goes PACE during the day.  Reported she needs full assistance for her ADLs.  Currently, son is out of town in [place] on vacation.  In ER, patient febrile, tachycardic, hypotensive in the 70's, and requiring 2L [place].  Her GCS has been around 7, unclear what her true baseline is but thought to be near baseline and non- verbal.  Labs noted for BUN 28, AG 16, alk phos 138, albumin 2.2, AST 42, LDH 221, PCT 2.32, WBC 15.7, Hgb 11.5, HCT 33.8, INR 1.4, COVID positive, CXR showing left lower infiltrate and pleural effusion, UA with trace leukocytes.  [place] without acute findings. Received 3L fluids in ER with some improvement in blood pressure but MAP remains marginal.  Cultures sent and started on cefepime and vancomycin.  Mental status has improved mildly in ER, but still not following commands or verbal.  EDP was able to talk with son previously who verified he wants everything done for this mother.   PCCM called for possible ICU  admit.   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 No acute findings. 2. Mild atrophic change and chronic ischemic microvascular disease. 3. Stable right occipital craniectomy with underlying encephalomalacia over the right cerebellar hemisphere. 4. Minimal chronic sinus inflammatory change.  Micro Data:  1/15 SARS >> positive  1/15 BCx 2 >> 1/15 UC >>  Antimicrobials:  1/15 cefepime  1/15 -16 vancomycin >> 1/15 MRSA Zosyn 1/16-1/20  Interim History / Subjective:  No overnight events No significant change in mental status Son at bedside  Objective   Blood pressure 125/69, pulse 61, temperature 98.06 F (36.7 C), resp. rate 18, height 5' 4"  (1.626 m), weight 49.9 kg, SpO2 96 %.        Intake/Output Summary (Last 24 hours) at 10/20/2020 1216 Last data filed at 10/20/2020 1200 Gross per 24 hour  Intake 1519.26 ml  Output 710 ml  Net 809.26 ml   Filed Weights   10/18/20 0325 10/19/20 0300 10/20/20 0400  Weight: 49.8 kg 48.2 kg 49.9 kg   Examination: Constitutional: Frail, elderly lady, does not appear to be in distress Eyes: Extraocular muscles intact Ears, nose, mouth, and throat: Moist oral mucosa Cardiovascular: S1-S2 appreciated Respiratory: Clear breath sounds Gastrointestinal: Soft Skin: Within normal limit Neurologic: Moving all extremities Psychiatric: RASS 0  Resolved Hospital Problem list    Assessment & Plan:   Patient with septic shock secondary to left lower lobe pneumonia, likely aspiration -On Unasyn -No fevers  Persistent visual plegia of unclear cause On dopamine and Levophed Will wean Continue midodrine 10 mg 3 times daily  Acute hypoxic respiratory failure -Secondary to aspiration neck -Chronic aspiration syndrome -Appears to be  improving  Had COVID in December COVID PCR was recently positive Isolation precautions discontinued  Acute on chronic encephalopathy secondary to sepsis Vascular dementia History of seizure disorder -No active seizures on EEG  Did speak with son at bedside Feels she is close to her baseline   Will currently continue Zosyn for 7 days in total-to end on 1/22 Continue to try to liberate from intravenous pressors-  Best practice (evaluated daily)  Diet: TF Pain/Anxiety/Delirium protocol (if indicated): n/a VAP protocol (if indicated): n/a DVT prophylaxis: heparin SQ/ SCDs GI prophylaxis: n/a Glucose control: trend on BMET Mobility:  BR Disposition: ICU pending pressor liberation  Goals of Care:  Code status reversed by son over phone 10/17/20  Still requiring multiple pressors weaning as tolerated Risk of acute deterioration remains  The patient is critically ill with multiple organ systems failure and requires high complexity decision making for assessment and support, frequent evaluation and titration of therapies, application of advanced monitoring technologies and extensive interpretation of multiple databases. Critical Care Time devoted to patient care services described in this note independent of APP/resident time (if applicable)  is 30 minutes.   Sherrilyn Rist MD [facility] Personal pager: [phone] If unanswered, please page CCM On-call: [phone]

## 2020-10-21 DIAGNOSIS — R6520 Severe sepsis without septic shock: Secondary | ICD-10-CM | POA: Diagnosis not present

## 2020-10-21 DIAGNOSIS — A419 Sepsis, unspecified organism: Secondary | ICD-10-CM | POA: Diagnosis not present

## 2020-10-21 LAB — GLUCOSE, CAPILLARY
Glucose-Capillary: 134 mg/dL — ABNORMAL HIGH (ref 70–99)
Glucose-Capillary: 147 mg/dL — ABNORMAL HIGH (ref 70–99)
Glucose-Capillary: 148 mg/dL — ABNORMAL HIGH (ref 70–99)
Glucose-Capillary: 149 mg/dL — ABNORMAL HIGH (ref 70–99)
Glucose-Capillary: 157 mg/dL — ABNORMAL HIGH (ref 70–99)
Glucose-Capillary: 160 mg/dL — ABNORMAL HIGH (ref 70–99)
Glucose-Capillary: 181 mg/dL — ABNORMAL HIGH (ref 70–99)

## 2020-10-21 MED ORDER — FLUDROCORTISONE ACETATE 0.1 MG PO TABS
0.1000 mg | ORAL_TABLET | Freq: Every day | ORAL | Status: DC
  Administered 2020-10-21 – 2020-10-22 (×2): 0.1 mg
  Filled 2020-10-21 (×3): qty 1

## 2020-10-21 MED ORDER — MIDODRINE HCL 5 MG PO TABS
15.0000 mg | ORAL_TABLET | Freq: Three times a day (TID) | ORAL | Status: DC
  Administered 2020-10-21 (×3): 15 mg
  Filled 2020-10-21 (×4): qty 3

## 2020-10-21 NOTE — Progress Notes (Signed)
NAME:  Janet Gutierrez, MRN:  [id number], DOB:  1946/07/20, LOS: 8 ADMISSION DATE:  10/13/2020, CONSULTATION DATE:  10/13/2020 REFERRING MD:  Dr. Ronnald Nian, CHIEF COMPLAINT:  AMS/ hypotension  Brief History:  75 year old female w/ hx of vascular dementia, seizures, known COVID positive sent from assisted living with AMS, difficulty swallowing, hypoxia, and fever.  Found to have pneumonia and possible UTI, ongoing hypotension in ER despite IVFs.  PCCM consulted for ICU admit.   History of Present Illness:  HPI obtained from medical chart review as patient is encephalopathic at baseline.  Unable to reach son by phone.     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  She is also COVID positive, tested positive 12/29.    She reportedly lives with her son at home, who is her primary caregiver in the evening and weekends and goes PACE during the day.  Reported she needs full assistance for her ADLs.  Currently, son is out of town in [place] on vacation.  In ER, patient febrile, tachycardic, hypotensive in the 70's, and requiring 2L [place].  Her GCS has been around 7, unclear what her true baseline is but thought to be near baseline and non- verbal.  Labs noted for BUN 28, AG 16, alk phos 138, albumin 2.2, AST 42, LDH 221, PCT 2.32, WBC 15.7, Hgb 11.5, HCT 33.8, INR 1.4, COVID positive, CXR showing left lower infiltrate and pleural effusion, UA with trace leukocytes.  [place] without acute findings. Received 3L fluids in ER with some improvement in blood pressure but MAP remains marginal.  Cultures sent and started on cefepime and vancomycin.  Mental status has improved mildly in ER, but still not following commands or verbal.  EDP was able to talk with son previously who verified he wants everything done for this mother.   PCCM called for possible ICU  admit.   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 No acute findings. 2. Mild atrophic change and chronic ischemic microvascular disease. 3. Stable right occipital craniectomy with underlying encephalomalacia over the right cerebellar hemisphere. 4. Minimal chronic sinus inflammatory change.  Micro Data:  1/15 SARS >> positive  1/15 BCx 2 >> 1/15 UC >>  Antimicrobials:  1/15 cefepime  1/15 -16 vancomycin >> 1/15 MRSA Zosyn 1/16-1/20  Interim History / Subjective:  No overnight events No significant change in mental status Remains on antibiotics  Objective   Blood pressure (!) 101/54, pulse 78, temperature 99.86 F (37.7 C), temperature source Bladder, resp. rate 18, height 5' 4"  (1.626 m), weight 50.1 kg, SpO2 95 %.        Intake/Output Summary (Last 24 hours) at 10/21/2020 1158 Last data filed at 10/21/2020 0800 Gross per 24 hour  Intake 859.15 ml  Output 1510 ml  Net -650.85 ml   Filed Weights   10/19/20 0300 10/20/20 0400 10/21/20 0438  Weight: 48.2 kg 49.9 kg 50.1 kg   Examination: Constitutional: Frail elderly lady, does not appear to be in distress Ears, nose, mouth, and throat: Moist oral mucosa Cardiovascular: S1-S2 appreciated Respiratory: Clear breath sounds Gastrointestinal: Soft, bowel sounds appreciated  Resolved Hospital Problem list  Assessment & Plan:   Patient with septic shock secondary to left lower lobe pneumonia Likely aspiration -On Unasyn-today is the last day -No fevers  Persistent vasoplegia of unclear source -Was on dopamine and Levophed -We will stop dopamine today -Increase midodrine dose to 15 mg 3 times daily -Fludrocortisone added -Goal is to try and stop IV pressors  Acute hypoxic respiratory failure Secondary to  aspiration -Chronic aspiration syndrome -Appears to be improving  Had COVID in December COVID PCR was recently positive Isolation precautions discontinued  Acute on chronic encephalopathy secondary to sepsis Vascular dementia History of seizure disorder -No seizures noted on recent EEG  Patient son feels she may be at baseline at present Goal is to wean off IV pressors and be able to start discharge planning   Will currently continue Zosyn for 7 days in total-to end on 1/22 Continue to try to liberate from intravenous pressors  Best practice (evaluated daily)  Diet: TF Pain/Anxiety/Delirium protocol (if indicated): n/a VAP protocol (if indicated): n/a DVT prophylaxis: heparin SQ/ SCDs GI prophylaxis: n/a Glucose control: trend on BMET Mobility:  BR Disposition: ICU pending pressor liberation  Goals of Care:  Code status reversed by son over phone 10/17/20  Still requiring intravenous pressors-being weaned Risk of acute deterioration remains  The patient is critically ill with multiple organ systems failure and requires high complexity decision making for assessment and support, frequent evaluation and titration of therapies, application of advanced monitoring technologies and extensive interpretation of multiple databases. Critical Care Time devoted to patient care services described in this note independent of APP/resident time (if applicable)  is 30 minutes.   Sherrilyn Rist MD [facility] Personal pager: [phone] If unanswered, please page CCM On-call: [phone]

## 2020-10-22 DIAGNOSIS — A419 Sepsis, unspecified organism: Secondary | ICD-10-CM | POA: Diagnosis not present

## 2020-10-22 DIAGNOSIS — U071 COVID-19: Secondary | ICD-10-CM

## 2020-10-22 DIAGNOSIS — J189 Pneumonia, unspecified organism: Secondary | ICD-10-CM | POA: Diagnosis not present

## 2020-10-22 DIAGNOSIS — R40241 Glasgow coma scale score 13-15, unspecified time: Secondary | ICD-10-CM | POA: Diagnosis not present

## 2020-10-22 LAB — GLUCOSE, CAPILLARY
Glucose-Capillary: 118 mg/dL — ABNORMAL HIGH (ref 70–99)
Glucose-Capillary: 158 mg/dL — ABNORMAL HIGH (ref 70–99)
Glucose-Capillary: 130 mg/dL — ABNORMAL HIGH (ref 70–99)
Glucose-Capillary: 127 mg/dL — ABNORMAL HIGH (ref 70–99)
Glucose-Capillary: 137 mg/dL — ABNORMAL HIGH (ref 70–99)

## 2020-10-22 LAB — BASIC METABOLIC PANEL
Anion gap: 7 (ref 5–15)
BUN: 25 mg/dL — ABNORMAL HIGH (ref 8–23)
CO2: 32 mmol/L (ref 22–32)
Calcium: 8.3 mg/dL — ABNORMAL LOW (ref 8.9–10.3)
Chloride: 103 mmol/L (ref 98–111)
Creatinine, Ser: 0.47 mg/dL (ref 0.44–1.00)
GFR, Estimated: >60 mL/min (ref >60)
Glucose, Bld: 147 mg/dL — ABNORMAL HIGH (ref 70–99)
Potassium: 3.8 mmol/L (ref 3.5–5.1)
Sodium: 142 mmol/L (ref 135–145)

## 2020-10-22 MED ORDER — MIDODRINE HCL 5 MG PO TABS
20.0000 mg | ORAL_TABLET | Freq: Three times a day (TID) | ORAL | Status: DC
  Administered 2020-10-22 (×3): 20 mg
  Filled 2020-10-22 (×3): qty 4

## 2020-10-22 MED ORDER — LACTATED RINGERS IV BOLUS
1000.0000 mL | Freq: Once | INTRAVENOUS | Status: AC
  Administered 2020-10-22: 1000 mL via INTRAVENOUS

## 2020-10-22 NOTE — Progress Notes (Signed)
  Speech Language Pathology Treatment: Dysphagia  Patient Details Name: Janet Gutierrez MRN: [id number] DOB: 1946-07-04 Today's Date: 10/22/2020 Time: 2831-5176 SLP Time Calculation (min) (ACUTE ONLY): 16 min  Assessment / Plan / Recommendation Clinical Impression  Pt was minimally more alert and interactive compared to previous visit. Oral care was provided to make her more arousalable in which she demonstrated reflexive responsives such as closing her mouth. When provided a small amount of thin liquid on a tsp she immediately coughed and anterior spillage of the majority of the bolus was noted. Attempted to give pt a small amount of puree but despite Max verbal and tactile cues, the pt was unable to elicit a swallow and required suctioning by the SLP. Recommend that the pt remain NPO given s/s of aspiration with small amounts of liquids and difficulty following cues to elicit a swallow reflex.    HPI HPI: 75 year old female w/ hx of vascular dementia, seizures, known COVID positive sent from assisted living with AMS, difficulty swallowing, hypoxia, and fever.  Found to have pneumonia and possible UTI, ongoing hypotension in ER despite IVFs.  Hx CVA at age 25. Now with septic shock secondary to pna, likely aspiration; encephalopathy.      SLP Plan  Continue with current plan of care       Recommendations  Diet recommendations: NPO Medication Administration: Via alternative means                Oral Care Recommendations: Oral care QID Follow up Recommendations: Skilled Nursing facility SLP Visit Diagnosis: Dysphagia, unspecified (R13.10) Plan: Continue with current plan of care       GO               Zettie Cooley., SLP Student 10/22/2020, 4:03 PM

## 2020-10-22 NOTE — Plan of Care (Signed)

## 2020-10-22 NOTE — Progress Notes (Signed)
eLink Physician-Brief Progress Note Patient Name: Janet Gutierrez DOB: Apr 09, 1946 MRN: [id number]   Date of Service  10/22/2020  HPI/Events of Note  Multiple issues: 1. Bradycardia - Not on Propofol, Precedex, B-Blocker, Digoxin or Calcium Channel agent. 2. Hypovolemia - CVP = 3.   eICU Interventions  Plan:  1. LR 1 liter IV over 1 hour now.  2. Trend HR.      Intervention Category Major Interventions: Hypovolemia - evaluation and treatment with fluids;Arrhythmia - evaluation and management  Lenell Antu 10/22/2020, 10:36 PM

## 2020-10-22 NOTE — Progress Notes (Signed)
Physical Therapy Treatment Patient Details Name: Janet Gutierrez MRN: [id number] DOB: 04-15-1946 Today's Date: 10/22/2020    History of Present Illness 75 y.o. 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 Known covid positive status 2+ weeks ago. Pt from [place] where she had a fever, with decreased responsiveness and inability to swallow. Only at [facility] for respite care as sone and wife her main caregivers went on a cruise. Admitted 10/13/20 with altered mental status/toxic metabolic encephalopathy in the setting of COVID-19 infection and possible sepsis EEG suggestive of toxic metabolic encephalopathy    PT Comments    Per RN and chart pt's son reports pt to be near baseline. Pt dependent for all mobility and ADLs PTA. She went to [facility] for a week as he went on vacation to [place] and couldn't take care of patient. Per palliative note pt's son lifts her to move her, bathes her, and has to help her feed and chew. Pt remains to require total A for all mobiltiy. Pt unable to tolerate ROM to L UE and LE due to flexor tone and grimacing in discomfort. Pts BP dec to 89/48 and HR stayed between 46-51bpm. Per chart son want to take pt home. If patients son can't provide total assist to pt then she will need SNF.   Follow Up Recommendations  SNF unless son can provide total care to patient at home      Equipment Recommendations  Other (comment)    Recommendations for Other Services OT consult     Precautions / Restrictions Precautions Precautions: Fall Precaution Comments: watch BP Restrictions Weight Bearing Restrictions: No    Mobility  Bed Mobility Overal bed mobility: Needs Assistance Bed Mobility: Supine to Sit;Sit to Supine     Supine to sit: Total assist;HOB elevated Sit to supine: Total  assist;HOB elevated   General bed mobility comments: total A, no voluntary assist  Transfers                 General transfer comment: pt would need hoyer lift  Ambulation/Gait             General Gait Details: unable, per RN pt's son states she doesn't amb   Social research officer, government [employer] (Stroke Patients Only)       Balance Overall balance assessment: Needs assistance   Sitting balance-Leahy Scale: Zero Sitting balance - Comments: pt dependent on PT to support pt at EOB, pt with retropulsion, resistant to trunk flexion, unable to achieve midline posture                                    Cognition Arousal/Alertness: Lethargic Behavior During Therapy: Flat affect Overall Cognitive Status: History of cognitive impairments - at baseline                                 General Comments: eyes open but no engagement, no command follow, no eye contact with therapist, resistance to movement      Exercises      General Comments General comments (skin integrity, edema, etc.): BP dropped to 89/48, HR 46-51, RN aware and stated she  went down on her BP medication      Pertinent Vitals/Pain Pain Assessment: Faces Faces Pain Scale: Hurts little more Pain Location: grimacing with ROM to L UE and LE Pain Intervention(s): Monitored during session    Home Living                      Prior Function            PT Goals (current goals can now be found in the care plan section) Acute Rehab PT Goals PT Goal Formulation: Patient unable to participate in goal setting Time For Goal Achievement: 10/31/20 Potential to Achieve Goals: Poor Progress towards PT goals: Not progressing toward goals - comment    Frequency    Min 2X/week      PT Plan Current plan remains appropriate    Co-evaluation              AM-PAC PT "6 Clicks" Mobility   Outcome Measure  Help needed turning from  your back to your side while in a flat bed without using bedrails?: Total Help needed moving from lying on your back to sitting on the side of a flat bed without using bedrails?: Total Help needed moving to and from a bed to a chair (including a wheelchair)?: Total Help needed standing up from a chair using your arms (e.g., wheelchair or bedside chair)?: Total Help needed to walk in hospital room?: Total Help needed climbing 3-5 steps with a railing? : Total 6 Click Score: 6    End of Session Equipment Utilized During Treatment: Oxygen Activity Tolerance: Patient limited by lethargy (and no command follow or comprehension) Patient left: in bed;with call bell/phone within reach;with bed alarm set Nurse Communication: Mobility status PT Visit Diagnosis: Hemiplegia and hemiparesis;Difficulty in walking, not elsewhere classified (R26.2);Other symptoms and signs involving the nervous system (R29.898);Adult, failure to thrive (R62.7) Hemiplegia - Right/Left: Right Hemiplegia - dominant/non-dominant: Dominant Hemiplegia - caused by: Nontraumatic intracerebral hemorrhage     Time: 1020-1038 PT Time Calculation (min) (ACUTE ONLY): 18 min  Charges:  $Therapeutic Activity: 8-22 mins                     Janet Gutierrez, PT, DPT Acute Rehabilitation Services Pager #: [phone] Office #: [phone]    Janet Gutierrez 10/22/2020, 2:27 PM

## 2020-10-22 NOTE — Plan of Care (Signed)
  Problem: Clinical Measurements: Goal: Ability to maintain clinical measurements within normal limits will improve Outcome: Progressing Goal: Will remain free from infection Outcome: Progressing Goal: Diagnostic test results will improve Outcome: Progressing Goal: Respiratory complications will improve Outcome: Progressing Goal: Cardiovascular complication will be avoided Outcome: Progressing   Problem: Activity: Goal: Risk for activity intolerance will decrease Outcome: Progressing   Problem: Nutrition: Goal: Adequate nutrition will be maintained Outcome: Progressing   Problem: Coping: Goal: Level of anxiety will decrease Outcome: Progressing   Problem: Pain Managment: Goal: General experience of comfort will improve Outcome: Progressing   Problem: Safety: Goal: Ability to remain free from injury will improve Outcome: Progressing   Problem: Skin Integrity: Goal: Risk for impaired skin integrity will decrease Outcome: Progressing   

## 2020-10-22 NOTE — Progress Notes (Signed)
NAME:  Janet Gutierrez, MRN:  [id number], DOB:  Sep 15, 1946, LOS: 9 ADMISSION DATE:  10/13/2020, CONSULTATION DATE:  10/13/2020 REFERRING MD:  Dr. Lockie Mola, CHIEF COMPLAINT:  AMS/ hypotension  Brief History:  75 year old female w/ hx of vascular dementia, seizures, known COVID positive sent from assisted living with AMS, difficulty swallowing, hypoxia, and fever.  Found to have pneumonia and possible UTI, ongoing hypotension in ER despite IVFs.   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 No acute findings. 2. Mild atrophic change and chronic ischemic microvascular disease. 3. Stable right occipital craniectomy with underlying encephalomalacia over the right cerebellar hemisphere. 4. Minimal chronic sinus inflammatory change.  Micro Data:  1/15 SARS >> positive  1/15 BCx 2 >> negative 1/15 UC >> negative  Antimicrobials:  1/15 cefepime  1/15 -16 vancomycin >> 1/15 MRSA Zosyn 1/16-1/20  Interim History / Subjective:  Patient remains severely encephalopathic Continue to require low-dose vasopressors  Objective   Blood pressure (!) 120/59, pulse (!) 48, temperature 97.9 F (36.6 C), resp. rate 14, height 5\' 4"  (1.626 m), weight 50.1 kg, SpO2 100 %.        Intake/Output Summary (Last 24 hours) at 10/22/2020 1447 Last data filed at 10/22/2020 0900 Gross per 24 hour  Intake 1044.49 ml  Output 750 ml  Net 294.49 ml   Filed Weights   10/20/20 0400 10/21/20 0438 10/22/20 0403  Weight: 49.9 kg 50.1 kg 50.1 kg   Examination:   Physical exam: General: Chronically ill-appearing female, lying on the bed HEENT: [place]/AT, eyes anicteric.  Dry mucous membranes Neuro: Lethargic, opens eyes with painful stimuli, not following commands, moving all 4 extremities spontaneously Chest:  Coarse breath sounds, no wheezes or rhonchi Heart: Regular rate and rhythm, no murmurs or gallops Abdomen: Soft, nontender, nondistended, bowel sounds present Skin: No rash   Resolved Hospital Problem list    Assessment & Plan:  Septic shock due to left lower lobe pneumonia, likely aspiration Patient completed therapy with Zosyn for 7 days Not spiking fever White count has trended down Continue to require low-dose vasopressors Increase midodrine to 20 mg 3 times daily Continue fludrocortisone  Acute hypoxic respiratory failure Secondary to aspiration Chronic aspiration syndrome Appears to be improving, currently on 4 L oxygen  Acute on chronic encephalopathy secondary to sepsis Has baseline vascular dementia and seizure disorder Mental status did not improve No seizures noted on recent EEG  Had COVID in December COVID PCR was recently positive Isolation precautions discontinued   Best practice (evaluated daily)  Diet: TF Pain/Anxiety/Delirium protocol (if indicated): n/a VAP protocol (if indicated): n/a DVT prophylaxis: heparin SQ/ SCDs GI prophylaxis: n/a Glucose control: SSI Mobility:  BR Disposition: ICU  Goals of Care:  Code status reversed by son over phone 10/17/20 to full code   Total critical care time: 46 minutes  Performed by: 10/19/20   Critical care time was exclusive of separately billable procedures and treating other patients.   Critical care was necessary to treat or prevent imminent or life-threatening deterioration.   Critical care was time spent personally by me on the following activities: development of treatment plan with patient and/or surrogate as well as nursing, discussions with consultants, evaluation of patient's response to treatment, examination of patient, obtaining history from patient or surrogate, ordering and  performing treatments and interventions, ordering and review of laboratory studies, ordering and review of  radiographic studies, pulse oximetry and re-evaluation of patient's condition.   Cheri Fowler MD [facility] Pager: [phone] Mobile: [phone]

## 2020-10-23 DIAGNOSIS — J189 Pneumonia, unspecified organism: Secondary | ICD-10-CM | POA: Diagnosis not present

## 2020-10-23 DIAGNOSIS — U071 COVID-19: Secondary | ICD-10-CM | POA: Diagnosis not present

## 2020-10-23 DIAGNOSIS — R6520 Severe sepsis without septic shock: Secondary | ICD-10-CM | POA: Diagnosis not present

## 2020-10-23 DIAGNOSIS — A419 Sepsis, unspecified organism: Secondary | ICD-10-CM | POA: Diagnosis not present

## 2020-10-23 LAB — BASIC METABOLIC PANEL
Anion gap: 8 (ref 5–15)
BUN: 23 mg/dL (ref 8–23)
CO2: 30 mmol/L (ref 22–32)
Calcium: 8.3 mg/dL — ABNORMAL LOW (ref 8.9–10.3)
Chloride: 104 mmol/L (ref 98–111)
Creatinine, Ser: 0.44 mg/dL (ref 0.44–1.00)
GFR, Estimated: >60 mL/min (ref >60)
Glucose, Bld: 172 mg/dL — ABNORMAL HIGH (ref 70–99)
Potassium: 3.7 mmol/L (ref 3.5–5.1)
Sodium: 142 mmol/L (ref 135–145)

## 2020-10-23 LAB — CBC
HCT: 27.5 % — ABNORMAL LOW (ref 36.0–46.0)
Hemoglobin: 8.5 g/dL — ABNORMAL LOW (ref 12.0–15.0)
MCH: 32.4 pg (ref 26.0–34.0)
MCHC: 30.9 g/dL (ref 30.0–36.0)
MCV: 105 fL — ABNORMAL HIGH (ref 80.0–100.0)
Platelets: 539 10*3/uL — ABNORMAL HIGH (ref 150–400)
RBC: 2.62 MIL/uL — ABNORMAL LOW (ref 3.87–5.11)
RDW: 15.3 % (ref 11.5–15.5)
WBC: 8.5 10*3/uL (ref 4.0–10.5)
nRBC: 2.3 % — ABNORMAL HIGH (ref 0.0–0.2)

## 2020-10-23 LAB — GLUCOSE, CAPILLARY
Glucose-Capillary: 111 mg/dL — ABNORMAL HIGH (ref 70–99)
Glucose-Capillary: 113 mg/dL — ABNORMAL HIGH (ref 70–99)
Glucose-Capillary: 128 mg/dL — ABNORMAL HIGH (ref 70–99)
Glucose-Capillary: 137 mg/dL — ABNORMAL HIGH (ref 70–99)
Glucose-Capillary: 146 mg/dL — ABNORMAL HIGH (ref 70–99)
Glucose-Capillary: 155 mg/dL — ABNORMAL HIGH (ref 70–99)
Glucose-Capillary: 177 mg/dL — ABNORMAL HIGH (ref 70–99)

## 2020-10-23 LAB — MAGNESIUM: Magnesium: 2.3 mg/dL (ref 1.7–2.4)

## 2020-10-23 MED ORDER — HYDROCORTISONE NA SUCCINATE PF 100 MG IJ SOLR
50.0000 mg | Freq: Three times a day (TID) | INTRAMUSCULAR | Status: DC
  Administered 2020-10-23 – 2020-10-25 (×7): 50 mg via INTRAVENOUS
  Filled 2020-10-23 (×7): qty 2

## 2020-10-23 MED ORDER — POTASSIUM CHLORIDE 20 MEQ PO PACK
40.0000 meq | PACK | Freq: Once | ORAL | Status: AC
  Administered 2020-10-23: 40 meq
  Filled 2020-10-23: qty 2

## 2020-10-23 MED ORDER — FREE WATER
100.0000 mL | Status: DC
  Administered 2020-10-23 – 2020-11-01 (×57): 100 mL

## 2020-10-23 MED ORDER — ALBUMIN HUMAN 25 % IV SOLN
12.5000 g | Freq: Once | INTRAVENOUS | Status: AC
  Administered 2020-10-23: 12.5 g via INTRAVENOUS
  Filled 2020-10-23: qty 50

## 2020-10-23 MED ORDER — MIDODRINE HCL 5 MG PO TABS
30.0000 mg | ORAL_TABLET | Freq: Three times a day (TID) | ORAL | Status: DC
  Administered 2020-10-23 – 2020-10-26 (×10): 30 mg
  Filled 2020-10-23 (×10): qty 6

## 2020-10-23 MED ORDER — POLYETHYLENE GLYCOL 3350 17 G PO PACK: 17.0000 g | PACK | Freq: Every day | ORAL | Status: DC | PRN

## 2020-10-23 MED ORDER — POLYETHYLENE GLYCOL 3350 17 G PO PACK
17.0000 g | PACK | Freq: Every day | ORAL | Status: DC
  Administered 2020-10-23 – 2020-10-30 (×6): 17 g
  Filled 2020-10-23 (×7): qty 1

## 2020-10-23 NOTE — Progress Notes (Addendum)
eLink Physician-Brief Progress Note Patient Name: Janet Gutierrez DOB: 09/14/1946 MRN: [id number]   Date of Service  10/23/2020  HPI/Events of Note  Remains bradycardic - HR = 45-49. BP = 130/56 with MAP = 77. The patient has had bradycardia since yesterday AM.  Etiology of bradycardia is uncertain, however, bradycardia has been reported in patients with COVID and may be related to high levels of inflammatory cytokines acting directly on the SA Node. Bradycardia in the setting of COVID my be a warning sign of the onset of a serious cytokine storm. CVP = 2-3 in spite of crystalloid and albumin boluses. Will not chase CVP any further at present d/t COVID.  eICU Interventions  Continue present management.      Intervention Category Major Interventions: Arrhythmia - evaluation and management  [name] 10/23/2020, 4:44 AM

## 2020-10-23 NOTE — Progress Notes (Signed)
eLink Physician-Brief Progress Note Patient Name: Janet Gutierrez DOB: [birth year] MRN: [id number]   Date of Service  10/23/2020  HPI/Events of Note  Multiple issues: 1. Hypovolemia - CVP = 3 and 2. Bradycardia - HR = 48 with frequent PVCs and couplets.   eICU Interventions  Plan: 1. 25% Albumin 12.5 gm IV now. 2. BMP and Mg++ level STAT.      Intervention Category Major Interventions: Hypovolemia - evaluation and treatment with fluids;Arrhythmia - evaluation and management  [name] 10/23/2020, 12:37 AM

## 2020-10-23 NOTE — Progress Notes (Signed)
NAME:  Janet Gutierrez, MRN:  [id number], DOB:  12/27/1945, LOS: 10 ADMISSION DATE:  10/13/2020, CONSULTATION DATE:  10/13/2020 REFERRING MD:  Dr. Lockie Mola, CHIEF COMPLAINT:  AMS/ hypotension  Brief History:  75 year old female w/ hx of vascular dementia, seizures, known COVID positive sent from assisted living with AMS, difficulty swallowing, hypoxia, and fever.  Found to have pneumonia and possible UTI, ongoing hypotension in ER despite IVFs.   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 No acute findings. 2. Mild atrophic change and chronic ischemic microvascular disease. 3. Stable right occipital craniectomy with underlying encephalomalacia over the right cerebellar hemisphere. 4. Minimal chronic sinus inflammatory change.  Micro Data:  1/15 SARS >> positive  1/15 BCx 2 >> negative 1/15 UC >> negative  Antimicrobials:  1/15 cefepime  1/15 -16 vancomycin 1/15 MRSA Zosyn 1/16-1/20  Interim History / Subjective:  Patient remains severely encephalopathic Finally came off of low-dose vasopressor after increasing midodrine to 30 mg every 8 hours  Objective   Blood pressure 134/62, pulse (!) 44, temperature (!) 97.2 F (36.2 C), temperature source Axillary, resp. rate 14, height 5\' 4"  (1.626 m), weight 53.2 kg, SpO2 100 %. CVP:  [2 mmHg-3 mmHg] 2 mmHg      Intake/Output Summary (Last 24 hours) at 10/23/2020 1109 Last data filed at 10/23/2020 1000 Gross per 24 hour  Intake 1376.8 ml  Output 200 ml  Net 1176.8 ml   Filed Weights   10/21/20 0438 10/22/20 0403 10/23/20 0345  Weight: 50.1 kg 50.1 kg 53.2 kg   Examination:   Physical exam: General: Chronically ill-appearing female, lying on the bed HEENT: [place]/AT, eyes anicteric.  Moist mucous membranes Neuro: Lethargic,  opens eyes with painful stimuli, not following commands, moving all 4 extremities spontaneously Chest: Reduced air entry at bases bilaterally, no wheezes or rhonchi Heart: Bradycardic, no murmurs or gallops Abdomen: Soft, nontender, nondistended, bowel sounds present Skin: No rash   Resolved Hospital Problem list    Assessment & Plan:  Septic shock due to left lower lobe pneumonia, likely aspiration Patient completed therapy with Zosyn for 7 days Not spiking fever, white count has trended down Came off of Levophed this morning Continue midodrine 30 mg every 8 hours, titrated down once blood pressure improves Discontinue fludrocortisone Titrate Solu-Cortef to 50 every 8  Acute hypoxic respiratory failure, resolved Patient is off oxygen, currently O2 sat 95% on room air  Acute on chronic encephalopathy secondary to sepsis Has baseline vascular dementia and seizure disorder Mental status did not improve No seizures noted on recent EEG  Had COVID in December COVID PCR was recently positive Isolation precautions discontinued  Anemia of critical illness due to sepsis Monitor H&H and transfuse if less than 7 Best practice (evaluated daily)  Diet: TF Pain/Anxiety/Delirium protocol (if indicated): n/a VAP protocol (if indicated): n/a DVT prophylaxis: heparin SQ/ SCDs GI prophylaxis: n/a Glucose control: SSI Mobility: As tolerated.  PT/OT evaluation Disposition: If she remained off pressors then she can be transferred to telemetry later today  Goals of Care:  1/25: Spoke with patient's son, he would like to keep his mother with full aggressive care and full code Next goals of care discussion 2/1 CODE STATUS: Full code   Total critical care time: 35 minutes  Performed by: 2/25  Critical care time was exclusive of separately billable procedures and treating other patients.   Critical care was necessary to treat or prevent imminent or life-threatening  deterioration.   Critical care was time spent personally by me on the following activities: development of treatment plan with patient and/or surrogate as well as nursing, discussions with consultants, evaluation of patient's response to treatment, examination of patient, obtaining history from patient or surrogate, ordering and performing treatments and interventions, ordering and review of laboratory studies, ordering and review of radiographic studies, pulse oximetry and re-evaluation of patient's condition.   Cheri Fowler MD [facility] Pager: [phone] Mobile: [phone]

## 2020-10-23 NOTE — Plan of Care (Signed)
  Problem: Clinical Measurements: Goal: Ability to maintain clinical measurements within normal limits will improve Outcome: Progressing Goal: Will remain free from infection Outcome: Progressing Goal: Diagnostic test results will improve Outcome: Progressing Goal: Respiratory complications will improve Outcome: Progressing Goal: Cardiovascular complication will be avoided Outcome: Progressing   Problem: Activity: Goal: Risk for activity intolerance will decrease Outcome: Progressing   Problem: Nutrition: Goal: Adequate nutrition will be maintained Outcome: Progressing   Problem: Coping: Goal: Level of anxiety will decrease Outcome: Progressing   Problem: Elimination: Goal: Will not experience complications related to bowel motility Outcome: Progressing Goal: Will not experience complications related to urinary retention Outcome: Progressing   Problem: Pain Managment: Goal: General experience of comfort will improve Outcome: Progressing   Problem: Safety: Goal: Ability to remain free from injury will improve Outcome: Progressing   Problem: Skin Integrity: Goal: Risk for impaired skin integrity will decrease Outcome: Progressing   Problem: Health Behavior/Discharge Planning: Goal: Ability to manage health-related needs will improve Outcome: Not Progressing

## 2020-10-24 ENCOUNTER — Inpatient Hospital Stay (HOSPITAL_COMMUNITY): Payer: Medicare (Managed Care)

## 2020-10-24 DIAGNOSIS — E43 Unspecified severe protein-calorie malnutrition: Secondary | ICD-10-CM | POA: Insufficient documentation

## 2020-10-24 LAB — COMPREHENSIVE METABOLIC PANEL
ALT: 57 U/L — ABNORMAL HIGH (ref 0–44)
AST: 39 U/L (ref 15–41)
Albumin: 2 g/dL — ABNORMAL LOW (ref 3.5–5.0)
Alkaline Phosphatase: 98 U/L (ref 38–126)
Anion gap: 7 (ref 5–15)
BUN: 22 mg/dL (ref 8–23)
CO2: 31 mmol/L (ref 22–32)
Calcium: 8.5 mg/dL — ABNORMAL LOW (ref 8.9–10.3)
Chloride: 104 mmol/L (ref 98–111)
Creatinine, Ser: 0.39 mg/dL — ABNORMAL LOW (ref 0.44–1.00)
GFR, Estimated: >60 mL/min (ref >60)
Glucose, Bld: 146 mg/dL — ABNORMAL HIGH (ref 70–99)
Potassium: 4.2 mmol/L (ref 3.5–5.1)
Sodium: 142 mmol/L (ref 135–145)
Total Bilirubin: 0.4 mg/dL (ref 0.3–1.2)
Total Protein: 5 g/dL — ABNORMAL LOW (ref 6.5–8.1)

## 2020-10-24 LAB — GLUCOSE, CAPILLARY
Glucose-Capillary: 124 mg/dL — ABNORMAL HIGH (ref 70–99)
Glucose-Capillary: 124 mg/dL — ABNORMAL HIGH (ref 70–99)
Glucose-Capillary: 151 mg/dL — ABNORMAL HIGH (ref 70–99)
Glucose-Capillary: 90 mg/dL (ref 70–99)
Glucose-Capillary: 136 mg/dL — ABNORMAL HIGH (ref 70–99)
Glucose-Capillary: 118 mg/dL — ABNORMAL HIGH (ref 70–99)

## 2020-10-24 MED ORDER — ORAL CARE MOUTH RINSE: 15.0000 mL | Freq: Two times a day (BID) | OROMUCOSAL | Status: DC

## 2020-10-24 MED ORDER — ORAL CARE MOUTH RINSE
15.0000 mL | Freq: Two times a day (BID) | OROMUCOSAL | Status: DC
  Administered 2020-10-24 – 2020-11-03 (×21): 15 mL via OROMUCOSAL

## 2020-10-24 MED ORDER — OSMOLITE 1.5 CAL PO LIQD
1000.0000 mL | ORAL | Status: DC
  Administered 2020-10-24 – 2020-11-02 (×8): 1000 mL
  Filled 2020-10-24 (×12): qty 1000

## 2020-10-24 NOTE — Progress Notes (Signed)
Nutrition Follow Up  DOCUMENTATION CODES:   Severe malnutrition in context of chronic illness  INTERVENTION:   Transition tube feeding:  -Osmolite 1.5 @ 50 ml/hr via Cortrak (1200 ml) -ProSource TF 45 ml BID -Free water flushes 100 ml Q4 hrs   Provides: 1880 kcals, 97 grams protein, 914 ml free water (1514 ml with flushes)  NUTRITION DIAGNOSIS:   Severe Malnutrition related to chronic illness (dementia) as evidenced by severe fat depletion,severe muscle depletion.  Ongoing  GOAL:   Patient will meet greater than or equal to 90% of their needs   Addressed via TF  MONITOR:   Diet advancement,TF tolerance,Weight trends,Supplement acceptance,PO intake,Labs,I & O's  REASON FOR ASSESSMENT:   Consult Assessment of nutrition requirement/status  ASSESSMENT:   Patient with PMH significant for CVA with residual R hemiparesis, seizure disorder, vascular dementia, and known COVID positive (09/26/21). Presents this admission with PNA and possible UTI.   1/18- gastric Cortrak placed  Pt discussed during ICU rounds and with RN.   Off pressors. Remains severely encephalopathic. Tolerating tube feeding at goal rate. Diet unable to be advanced given current mental status. Family desires full aggressive care, would want long term feeding tube is necessary.   Admission weight: 61.2 kg  Current weight: 53.6 kg   UOP: 650 ml x 24 hrs   Medications: solumedrol. SS novolog, miralax Labs: CBG 111-151  Flowsheet Row Most Recent Value  Orbital Region Moderate depletion  Upper Arm Region Severe depletion  Thoracic and Lumbar Region Unable to assess  Buccal Region Severe depletion  Temple Region Severe depletion  Clavicle Bone Region Severe depletion  Clavicle and Acromion Bone Region Severe depletion  Scapular Bone Region Unable to assess  Dorsal Hand Severe depletion  Patellar Region Severe depletion  Anterior Thigh Region Severe depletion  Posterior Calf Region Severe depletion   Edema (RD Assessment) None  Hair Reviewed  Eyes Unable to assess  Mouth Unable to assess  Skin Reviewed  Nails Reviewed     Diet Order:   Diet Order            Diet NPO time specified  Diet effective now                 EDUCATION NEEDS:   Not appropriate for education at this time  Skin:  Skin Assessment: Skin Integrity Issues: Skin Integrity Issues:: DTI,Stage I DTI: sacrum Stage I: R heel  Last BM:  1/21  Height:   Ht Readings from Last 1 Encounters:  10/14/20 5\' 4"  (1.626 m)    Weight:   Wt Readings from Last 1 Encounters:  10/24/20 53.6 kg    BMI:  Body mass index is 20.28 kg/m.  Estimated Nutritional Needs:   Kcal:  1600-1900 kcal  Protein:  85-100 grams  Fluid:  >/= 1.6 L/day  10/26/20 RD, LDN Clinical Nutrition Pager listed in AMION

## 2020-10-24 NOTE — Progress Notes (Signed)
eLink Physician-Brief Progress Note Patient Name: Janet Gutierrez DOB: 1946/07/24 MRN: [id number]   Date of Service  10/24/2020  HPI/Events of Note  Notified of bradycardia and relative hypotension. HR ranges from 30s to 40s. BP 101/52  eICU Interventions  Midodrine may aggravate bradycardia. Will check electrolytes to include LFTS to see if midodrine needs to be adjusted     Intervention Category Intermediate Interventions: Hypotension - evaluation and management  Darl Pikes 10/24/2020, 4:48 AM

## 2020-10-24 NOTE — Progress Notes (Signed)
Transfer note: 75 year old female w/ hx of vascular dementia, seizures, known COVID positive, who was sent to ED from assisted living facility with altered mental status, fever, difficulty swallowing, hypoxia. She had known Covid positive, admitted for sepsis due to pneumonia and possible UTI, received antibiotics and admitted to ICU given ongoing hypotension in ER despite IVFs.   Subjective: Patient was seen and evaluated at bedside this morning. She is being transferred to floor from ICU and IMTS assuming care today. She had worsening of bradycardia at 40s last night that was discussed with PCCM attending via [facility] No other acute event overnight.  Objective:  Vital signs in last 24 hours: Vitals:   10/24/20 1500 10/24/20 1600 10/24/20 1611 10/24/20 1700  BP: (!) 102/44 (!) 88/44  (!) 120/46  Pulse: (!) 40 (!) 41  (!) 44  Resp: 11 11  11   Temp:   97.8 F (36.6 C)   TempSrc:   Oral   SpO2: 97% 100%  100%  Weight:      Height:       Physical Exam Nursing note reviewed: NG tube in place. Opens her eyes but does not follow commands.  Constitutional:      General: She is in acute distress.     Appearance: She is ill-appearing.  Cardiovascular:     Rate and Rhythm: Bradycardia present.     Heart sounds: No murmur heard.   Pulmonary:     Breath sounds: Rhonchi present. No wheezing or rales.  Abdominal:     General: Bowel sounds are normal.     Palpations: Abdomen is soft.     Tenderness: There is no abdominal tenderness. There is no guarding.  Musculoskeletal:     Right lower leg: No edema.     Left lower leg: No edema.  Skin:    General: Skin is warm and dry.     Capillary Refill: Capillary refill takes less than 2 seconds.  Neurological:     Comments: Opens her eyes with verbal stimuli. Moans but does not say words. Does not follow commands. Moves her hands.    . Assessment/Plan:  Principal Problem:   Severe sepsis (HCC) Active Problems:   Alzheimer's disease  (HCC)   Altered mental status   Seizures (HCC)   COVID-19 virus infection   Pneumonia   Dysphagia   Pressure injury of skin   Protein-calorie malnutrition, severe  Acute on chronic encephalopathy: likley 2/2 sepsis Septic shock 2/2 lower lobe PNA and probable UTI:  Recent COVID infection:  Acute hypoxic respiratory failure: Resolved  Septic shock 2/2 lower lobe PNA and probable UTI: Finished 7 day course of Abx. ( Received vancomycin 1/15 -16. Zosyn 1/16-1/20). 1/15 BCx 2 >> negative. 1/15 UC >> negative. Required Levophed and was on fluorocortisol for several days that stopped yesterday. Still on solu cortef and requires Midodrine though given low-soft BP and boarderline MAP. Discussed with [facility] attending before transfering patient to the floor regarding patient still has low blood pressure and may need more pressor. Per PCCM attending, given no evidence of poor perfusion, good kidney function, no indication to resume pressor at this moment and patient will be transferred to cardiac tele unit and will continue Midodrine to maintain blood pressure.  Recent COVID infectione: 1/15: COVID positive. Off of precautions now Encephalopathy: Acute on chronic dementia (has vascular dementia on baseline. Not communicate at baseline. Now worse and does not follow command regularly and is getting nutrition via NG tube.  Unfortunately  her encephalopathy did not improve despite treating sepsis and underlying infection etiology and passing acute COVID course. She is not hypoxic any more.  Remained hospitalized now given soft-low pressure that required pressor and also given altered mental status has not improved. Will need to discuss goal of care again with son again in next few days if continues to be encephalopathic. No seizure on EEG.  -SBP has been>100 on recent measures. Will keep monitoring. Can give IV fluid bolus if needed for hypotesnion. -Continue Midodrine 30 mg TID. Will try to decrease the dose  when possible. -Continue cardiac monitroing -Will need another goal of care discussion. -Speech eval -Continue tube feeding -Is on SoluCortef 50 mg TID -CBC daily -CMP daily  Bradycardia: Not on beta blocker or calcium channel blocker  Thought to be in setting of prior COVID infection vs Midodrine. Will keep on current dose of Midrinone given low-soft BP but will consider decreasing the dose when possible. Does not seem to be a candidate for PACE maker. -Continue cardiac monitoring.  GOC: Per last note from palliative care and discussion with son, patient is full code with full scope of care.  Patient not communicate currently, she opens her eyes, opens her mouth and moves her extremities minimally but does not follow all commands. she gets nutrition via NG tube.   -Appreciate palliative care discussion goal of care again  Bacterial PNA: Finished Abx course as above Resolved. Now on room air   Prior to Admission Living Arrangement: Anticipated Discharge Location: Home per son wishes Barriers to Discharge: Altered mental status and soft BP  Dispo: Anticipated discharge pending blood pressure stability and goal of care  Janet Pretty, MD 10/24/2020, 5:38 PM Pager: [phone] After 5pm on weekdays and 1pm on weekends: On Call pager [phone]

## 2020-10-24 NOTE — Progress Notes (Signed)
SLP Cancellation Note  Patient Details Name: Janet Gutierrez MRN: [id number] DOB: [birth year]   Cancelled treatment:       Reason Eval/Treat Not Completed: Other (comment) SLP called pt's son, Janet Gutierrez, to discuss swallowing and POC. Janet Gutierrez provided information about baseline level of function (primarily very soft foods via spoon and thin liquids via straw, utilizing tactile cues from family and in a fully upright position), and provided specific details about what types of flavors she will eat or drink. SLP also provided an update about what we have tried so far in terms of types of boluses and cueing. We agreed to meet tomorrow at 11:00 for swallowing tx so that he can also be present to provide the typical cueing that he provides at home. As discussed with Janet Gutierrez, will hold SLP f/u until that time.     Mahala Menghini., M.A. CCC-SLP Acute Rehabilitation Services Pager [phone] Office [phone]  10/24/2020, 1:28 PM

## 2020-10-25 LAB — CBC
HCT: 30 % — ABNORMAL LOW (ref 36.0–46.0)
Hemoglobin: 9.9 g/dL — ABNORMAL LOW (ref 12.0–15.0)
MCH: 33.2 pg (ref 26.0–34.0)
MCHC: 33 g/dL (ref 30.0–36.0)
MCV: 100.7 fL — ABNORMAL HIGH (ref 80.0–100.0)
Platelets: 527 10*3/uL — ABNORMAL HIGH (ref 150–400)
RBC: 2.98 MIL/uL — ABNORMAL LOW (ref 3.87–5.11)
RDW: 15.9 % — ABNORMAL HIGH (ref 11.5–15.5)
WBC: 7.7 10*3/uL (ref 4.0–10.5)
nRBC: 2.9 % — ABNORMAL HIGH (ref 0.0–0.2)

## 2020-10-25 LAB — BASIC METABOLIC PANEL
Anion gap: 10 (ref 5–15)
BUN: 21 mg/dL (ref 8–23)
CO2: 28 mmol/L (ref 22–32)
Calcium: 8.3 mg/dL — ABNORMAL LOW (ref 8.9–10.3)
Chloride: 104 mmol/L (ref 98–111)
Creatinine, Ser: 0.38 mg/dL — ABNORMAL LOW (ref 0.44–1.00)
GFR, Estimated: >60 mL/min (ref >60)
Glucose, Bld: 143 mg/dL — ABNORMAL HIGH (ref 70–99)
Potassium: 4.1 mmol/L (ref 3.5–5.1)
Sodium: 142 mmol/L (ref 135–145)

## 2020-10-25 LAB — GLUCOSE, CAPILLARY
Glucose-Capillary: 97 mg/dL (ref 70–99)
Glucose-Capillary: 151 mg/dL — ABNORMAL HIGH (ref 70–99)
Glucose-Capillary: 141 mg/dL — ABNORMAL HIGH (ref 70–99)
Glucose-Capillary: 158 mg/dL — ABNORMAL HIGH (ref 70–99)
Glucose-Capillary: 105 mg/dL — ABNORMAL HIGH (ref 70–99)
Glucose-Capillary: 116 mg/dL — ABNORMAL HIGH (ref 70–99)

## 2020-10-25 MED ORDER — LACTATED RINGERS IV BOLUS: 1000.0000 mL | Freq: Once | INTRAVENOUS | Status: DC

## 2020-10-25 MED ORDER — FAMOTIDINE 20 MG PO TABS
20.0000 mg | ORAL_TABLET | Freq: Every day | ORAL | Status: DC
  Administered 2020-10-25 – 2020-11-03 (×10): 20 mg via ORAL
  Filled 2020-10-25 (×11): qty 1

## 2020-10-25 MED ORDER — HYDROCORTISONE NA SUCCINATE PF 100 MG IJ SOLR
50.0000 mg | Freq: Four times a day (QID) | INTRAMUSCULAR | Status: DC
  Administered 2020-10-25 – 2020-10-28 (×10): 50 mg via INTRAVENOUS
  Filled 2020-10-25 (×10): qty 2

## 2020-10-25 MED ORDER — SODIUM CHLORIDE 0.9 % IV BOLUS
1000.0000 mL | Freq: Once | INTRAVENOUS | Status: AC
  Administered 2020-10-25: 1000 mL via INTRAVENOUS

## 2020-10-25 MED ORDER — FLUDROCORTISONE ACETATE 0.1 MG PO TABS
0.1000 mg | ORAL_TABLET | Freq: Every day | ORAL | Status: DC
  Administered 2020-10-25 – 2020-10-28 (×4): 0.1 mg via ORAL
  Filled 2020-10-25 (×4): qty 1

## 2020-10-25 MED ORDER — SODIUM CHLORIDE 0.9 % IV BOLUS
500.0000 mL | Freq: Once | INTRAVENOUS | Status: AC
  Administered 2020-10-25: 500 mL via INTRAVENOUS

## 2020-10-25 MED ORDER — CHLORHEXIDINE GLUCONATE 0.12 % MT SOLN
OROMUCOSAL | Status: AC
  Administered 2020-10-25: 15 mL via OROMUCOSAL
  Filled 2020-10-25: qty 15

## 2020-10-25 MED ORDER — HYDROCORTISONE NA SUCCINATE PF 100 MG IJ SOLR: 50.0000 mg | Freq: Three times a day (TID) | INTRAMUSCULAR | Status: DC

## 2020-10-25 NOTE — TOC Initial Note (Addendum)
Transition of Care [facility]) - Initial/Assessment Note    Patient Details  Name: Janet Gutierrez MRN: [id number] Date of Birth: 1945-12-06  Transition of Care [facility] CM/SW Contact:    Erin Sons, LCSW Phone Number: 10/25/2020, 2:48 PM  Clinical Narrative:                  Pt is non verbal. CSW called pt's son to discuss SNF recommendation. Son states that pt lives with him and is transported to rehab daily through [facility]. Son states that all her needs are managed by [facility] and he would like to maintain her rehab care through [facility]. He expressed no other needs.   CSW called and left message with Marylu Lund of [facility].   1522: CSW received call from Roxy Manns [phone] Social Worker with [facility]. She confirmed that PT is active with their rehab and can return home and continue with their rehab. She states that they will manage any needs and requests TOC reach out at time of discharge for any recommendations or needs.   Expected Discharge Plan: Skilled Nursing Facility Barriers to Discharge: Continued Medical Work up   Patient Goals and CMS Choice Patient states their goals for this hospitalization and ongoing recovery are:: Son wants pt to remain at home doing [facility] daily rehab   Choice offered to / list presented to : Adult Children  Expected Discharge Plan and Services Expected Discharge Plan: Skilled Nursing Facility       Living arrangements for the past 2 months: Single Family Home                                      Prior Living Arrangements/Services Living arrangements for the past 2 months: Single Family Home Lives with:: Adult Children          Need for Family Participation in Patient Care: Yes (Comment) Care giver support system in place?: Yes (comment) Current home services: Home PT,Home RN Criminal Activity/Legal Involvement Pertinent to Current Situation/Hospitalization: No - Comment as needed  Activities of Daily Living Home  Assistive Devices/Equipment: None ([place]) ADL Screening (condition at time of admission) Patient's cognitive ability adequate to safely complete daily activities?: No Is the patient deaf or have difficulty hearing?: No Does the patient have difficulty seeing, even when wearing glasses/contacts?: No Does the patient have difficulty concentrating, remembering, or making decisions?: Yes Patient able to express need for assistance with ADLs?: No Does the patient have difficulty dressing or bathing?: Yes Independently performs ADLs?: No Communication: Dependent Is this a change from baseline?: Pre-admission baseline Dressing (OT): Dependent Is this a change from baseline?: Pre-admission baseline Grooming: Dependent Is this a change from baseline?: Pre-admission baseline Feeding: Dependent Is this a change from baseline?: Pre-admission baseline Bathing: Dependent Is this a change from baseline?: Pre-admission baseline Toileting: Dependent Is this a change from baseline?: Pre-admission baseline In/Out Bed: Dependent Is this a change from baseline?: Pre-admission baseline Walks in Home: Dependent Is this a change from baseline?: Pre-admission baseline Does the patient have difficulty walking or climbing stairs?: Yes Weakness of Legs: Both Weakness of Arms/Hands: Both  Permission Sought/Granted                  Emotional Assessment         Alcohol / Substance Use: Not Applicable Psych Involvement: No (comment)  Admission diagnosis:  Septic shock (HCC) [A41.9,  R65.21] Severe sepsis (HCC) [A41.9, R65.20] Community acquired pneumonia, unspecified laterality [J18.9] COVID-19 [U07.1] Patient Active Problem List   Diagnosis Date Noted  . Protein-calorie malnutrition, severe 10/24/2020  . Pressure injury of skin 10/19/2020  . Severe sepsis (HCC) 10/13/2020  . COVID-19 virus infection 10/13/2020  . Pneumonia 10/13/2020  . Dysphagia 10/13/2020  . Spell of altered  consciousness   . Seizures (HCC)   . Altered mental status 05/01/2017  . Alzheimer's disease (HCC) 11/30/2013   PCP:  Patient, No Pcp Per Pharmacy:  No Pharmacies Listed    Social Determinants of Health (SDOH) Interventions    Readmission Risk Interventions No flowsheet data found.

## 2020-10-25 NOTE — Care Management Important Message (Signed)
Important Message  Patient Details  Name: Janet Gutierrez MRN: [id number] Date of Birth: 08/01/1946   Medicare Important Message Given:  Yes     Renie Ora 10/25/2020, 12:33 PM

## 2020-10-25 NOTE — Progress Notes (Addendum)
Subjective:   Patient examined at bedside. She occasionally opens her eyes but does not give any other meaningful responses to verbal or physical stimuli.   Objective:  Vital signs in last 24 hours: Vitals:   10/25/20 0000 10/25/20 0144 10/25/20 0514 10/25/20 0635  BP: (!) 119/48 (!) 110/50 (!) 90/45 (!) 81/48  Pulse:  (!) 40 (!) 37 (!) 43  Resp: 12 18 17    Temp:  97.9 F (36.6 C)    TempSrc:  Oral    SpO2:  97% 100%   Weight:      Height:        Physical Exam Constitutional: no acute distress Head: atraumatic ENT: external ears normal Eyes: EOMI Cardiovascular: regular rate and rhythm, normal heart sounds, no LE edema Pulmonary: effort normal, lungs clear to ascultation bilaterally though exam limited by inability to follow commands to take deep breaths Abdominal: flat, nontender, bowel sounds normal Skin: warm and dry Neurological: obtunded, occasionally opens eyes to voice but other meaningful responses, does not follow any commands  Assessment/Plan: Janet Gutierrez is a 75 y.o. female with hx of vascular dementia, prior stroke, dysphagia, and seizure disorder presenting with septic shock 2/2 pneumonia and possibly UTI. Had extended ICU stay and completed 7 days of Abx, transferred to our service on 1/26. Has remained persistently hypotensive despite midodrine and solu-cortef. Receiving nutrition through NG tube.  Principal Problem:   Severe sepsis (HCC) Active Problems:   Alzheimer's disease (HCC)   Altered mental status   Seizures (HCC)   COVID-19 virus infection   Pneumonia   Dysphagia   Pressure injury of skin   Protein-calorie malnutrition, severe  Septic shock 2/2 pneumonia vs UTI Acute hypoxic respiratory failure - resolved Presented in septic shock, CXR with pneumonia and urine culture grew strep agalactiae and staph simulans. Treated with 7 days Zosyn. Fever and white count have resolved. Saturating well on room air. Required Levophed and  fludrocortisol. Now transferred out of ICU, but still requiring high doses of midodrine, stress dose steroids, and fluid boluses to maintain blood pressure. Per PCCM attending, no indication to resume pressor at this time.  - SBP < 100 typically and MAP typically 50s-60s - continue monitoring. IV fluid boluses as needed. Has received 1.5L so far today, no signs of fluid overload - Continue Midodrine 30 mg TID. Will try to decrease the dose when possible. - Is on SoluCortef 50 mg TID  Bradycardia Due to prior septic shock as above vs midodrine use. Will try to wean midodrine as tolerated, but she is requiring high doses to support her blood pressure, and even with that is requiring occasional fluid boluses. No beta blocker or CCB. - continue cardiac monitor - will midodrine as BP tolerates  Acute on chronic encephalopathy Vascular dementia Goals of care Per son, she is "not 100%" but not far from her baseline, has history of vascular dementia. Likely some contribution from delirium, toxic/metabolid encephalopathy, progressive functional decline.  At baseline, she is unable to communicate or understand most words, but does respond to tactile and other cues that they have developed over the years. She has been in a similar state since 2013. His goal is to take her home and give her as much length of life as possibly.  - palliative care consulted, appreciate assistance - will repeat goals of care discussion tomorrow in person  Requires enteric nutrition She is currently receiving adequate nutrition through tube feeds. Greatly appreciate assistance from SLP and nutritionist. On most  recent SLP eval, son provided high level of assistance and swallowed 2 boluses of Ensure, then stopped accepting boluses. Son states that she sometimes gets tired like this at home, and he wants to re-evaluate her later in the afternoon when she is more alert. Discussed with son that this is insufficient to support  nutritional needs, but he thinks she will do better when she is more alert in the afternoon/evening. He will return tomorrow to meet with SLP again. - continue feeds through Cortrak per nutrition: osmolite 50cc/hr, prosource 65ml BID, and free water flushes - SLP following, continue NPO for now - CMP daily  Acute on chronic anemia Baseline is around 11, she has been stable between 9-10 this admission. - trend CBC   Diet:  NPO, tube feeds IVF:  Normal saline VTE:  SCDs Prior to Admission Living Arrangement:  home Anticipated Discharge Location:  Home per family preference Barriers to Discharge:  Hypotension, nutritional needs Dispo: Anticipated discharge pending medical workup.   Remo Lipps, MD 10/25/2020, 6:43 AM Pager: [phone] After 5pm on weekdays and 1pm on weekends: On Call pager [phone]

## 2020-10-25 NOTE — Progress Notes (Signed)
   10/25/20 0144  Assess: MEWS Score  Temp 97.9 F (36.6 C)  BP (!) 110/50  Pulse Rate (!) 40  Resp 18  Level of Consciousness Responds to Voice  SpO2 97 %  O2 Device Room Air  Assess: MEWS Score  MEWS Temp 0  MEWS Systolic 0  MEWS Pulse 1  MEWS RR 0  MEWS LOC 1  MEWS Score 2  MEWS Score Color Yellow  Assess: if the MEWS score is Yellow or Red  Were vital signs taken at a resting state? Yes  Focused Assessment No change from prior assessment  Early Detection of Sepsis Score *See Row Information* Low  MEWS guidelines implemented *See Row Information* No, previously yellow, continue vital signs every 4 hours  Treat  Pain Scale PAINAD  Breathing 0  Negative Vocalization 0  Facial Expression 0  Body Language 0  Consolability 0  PAINAD Score 0  Document  Patient Outcome Not stable and remains on department  Progress note created (see row info) Yes

## 2020-10-25 NOTE — Progress Notes (Signed)
Notified on-call physician of deteriorating blood pressure and MAP.

## 2020-10-25 NOTE — Progress Notes (Signed)
Speech Language Pathology Treatment: Dysphagia  Patient Details Name: Camani Sesay MRN: [id number] DOB: Jul 05, 1946 Today's Date: 10/25/2020 Time: 1101-1209 SLP Time Calculation (min) (ACUTE ONLY): 68 min  Assessment / Plan / Recommendation Clinical Impression  Pt was seen with son, Mena Goes, present to demonstrate how he assists his mother with eating at home, using consistencies and flavors that she typically prefers. Quinton provided Max-Total A throughout feeding, physically closing his mother's mouth for her at times and using a spoon to push anterior loss of purees back into her mouth. Pt was occasionally able to generate enough of a suck response on a straw to get liquids in her mouth, but regardless of consistency she was often not generating a swallow response. She did swallow x2 in response to a bolus of Ensure. After about an hour of trying to feed her, she then stopped accepting boluses. There was coughing noted throughout PO trials regardless of consistency, sometimes when pt had not generated a swallow in response to a bolus that was in her mouth.   I discussed some of my concerns with Quinton: particularly with signs of aspiration as well as likelihood of not meeting nutritional/hydration needs when it is so effortful for her to get in such a small amount of POs. He believes that there are still several factors contributing to current presentation, including that she is tired right now, she is in the bed all day here, and he is also asking questions about her medications and how that could be impacting her LOA. SLP encouraged him to talk to his MD if he has concerns about meds, but also encouraged him to talk to MD about what his overall GOC may be as far as PO intake is concerned. At this point, while I think he wants her to be able to eat and drink, it seems like his primary goals are to get his mother home and to give her as much length of life as he can. I discussed what comfort  feeds can look like, and that they can be done at home, but they don't necessarily aim to prolong life. He does seem to be very willing to do whatever his mother needs to be at home, and he even asked about putting her on thicker liquids to get her home. I educated that the risks would likely still be present given current presentation. For now, since he wants her to be as safe as possible and the risk for aspiration seems high, will keep her NPO. We plan to meet again tomorrow at 14:00.   HPI HPI: 75 year old female w/ hx of vascular dementia, seizures, known COVID positive sent from assisted living with AMS, difficulty swallowing, hypoxia, and fever.  Found to have pneumonia and possible UTI, ongoing hypotension in ER despite IVFs.  Hx CVA at age 33. Now with septic shock secondary to pna, likely aspiration; encephalopathy.      SLP Plan  Continue with current plan of care       Recommendations  Diet recommendations: NPO Medication Administration: Via alternative means                Oral Care Recommendations: Oral care QID Follow up Recommendations: Skilled Nursing facility SLP Visit Diagnosis: Dysphagia, unspecified (R13.10) Plan: Continue with current plan of care       GO                Mahala Menghini., M.A. CCC-SLP Acute Herbalist [phone] Office [phone]  10/25/2020, 12:32 PM

## 2020-10-25 NOTE — Progress Notes (Addendum)
Paged for hypotension, which has been occurring intermittently since vasopressors were stopped two days ago. She is currently on midodrine 30 mg tid with MAP of 50. Son is at bedside and states she is currently at her baseline. She does open eyes to voice and localizes to touch. HR is in the 50s, which has been chronic, BP 78/46. Temperature has been on the lower side. She has received 1L bolus and .5L bolus with improvement in blood pressure and recurrence of hypotension. On physical exam no m/r/g, lung sounds are clear, no edema on exam. No reaction to abdominal palpation, BS normal. No medications that would cause hypotension. She has already recently completed antibiotic treatment for likely pneumonia with two days vanc and 7 days zosyn on 1/23. White count is normal. Hemoglobin has been stable. Her florinef was also stopped two days ago and hydrocortisone was decreased to q8h two days ago, which may need a slower taper.   - continue midodrine 30 mg tid - bolus another 1L NS - labs have been stable this morning, will not repeat at this time as hypotension has been present intermittently since stopping pressors  - restart florinef and increase hydrocortisone back to q6h and then taper - if no improvement with resumption of steroids, will obtain UA, blood culture and discuss with PCCM  Seawell, Jaimie A, DO 10/25/2020, 8:37 PM Pager: [phone]

## 2020-10-25 NOTE — Progress Notes (Signed)
PT Cancellation Note  Patient Details Name: Janet Gutierrez MRN: [id number] DOB: [birth year]   Cancelled Treatment:    Reason Eval/Treat Not Completed: Patient declined, no reason specified. Discontinue orders received due to pt decline and inability to follow commands. Please reorder if appropriate.    Sheral Apley 10/25/2020, 3:34 PM

## 2020-10-25 NOTE — Progress Notes (Addendum)
Rapid response recommended providing midodrine early. Midodrine provided. See MAR.

## 2020-10-26 ENCOUNTER — Inpatient Hospital Stay (HOSPITAL_COMMUNITY): Payer: Medicare (Managed Care)

## 2020-10-26 DIAGNOSIS — I313 Pericardial effusion (noninflammatory): Secondary | ICD-10-CM

## 2020-10-26 LAB — GLUCOSE, CAPILLARY
Glucose-Capillary: 108 mg/dL — ABNORMAL HIGH (ref 70–99)
Glucose-Capillary: 120 mg/dL — ABNORMAL HIGH (ref 70–99)
Glucose-Capillary: 124 mg/dL — ABNORMAL HIGH (ref 70–99)
Glucose-Capillary: 127 mg/dL — ABNORMAL HIGH (ref 70–99)
Glucose-Capillary: 129 mg/dL — ABNORMAL HIGH (ref 70–99)
Glucose-Capillary: 132 mg/dL — ABNORMAL HIGH (ref 70–99)
Glucose-Capillary: 144 mg/dL — ABNORMAL HIGH (ref 70–99)

## 2020-10-26 LAB — CBC
HCT: 29.9 % — ABNORMAL LOW (ref 36.0–46.0)
Hemoglobin: 9.4 g/dL — ABNORMAL LOW (ref 12.0–15.0)
MCH: 33 pg (ref 26.0–34.0)
MCHC: 31.4 g/dL (ref 30.0–36.0)
MCV: 104.9 fL — ABNORMAL HIGH (ref 80.0–100.0)
Platelets: 601 10*3/uL — ABNORMAL HIGH (ref 150–400)
RBC: 2.85 MIL/uL — ABNORMAL LOW (ref 3.87–5.11)
RDW: 16.6 % — ABNORMAL HIGH (ref 11.5–15.5)
WBC: 8.5 10*3/uL (ref 4.0–10.5)
nRBC: 1.5 % — ABNORMAL HIGH (ref 0.0–0.2)

## 2020-10-26 LAB — ECHOCARDIOGRAM LIMITED
Area-P 1/2: 2.39 cm2
Height: 64 in
S' Lateral: 2.3 cm
Weight: 2024.7 oz

## 2020-10-26 LAB — IRON AND TIBC
Iron: 60 ug/dL (ref 28–170)
Saturation Ratios: 21 % (ref 10.4–31.8)
TIBC: 291 ug/dL (ref 250–450)
UIBC: 231 ug/dL

## 2020-10-26 LAB — FERRITIN: Ferritin: 250 ng/mL (ref 11–307)

## 2020-10-26 LAB — COMPREHENSIVE METABOLIC PANEL
ALT: 39 U/L (ref 0–44)
AST: 26 U/L (ref 15–41)
Albumin: 2 g/dL — ABNORMAL LOW (ref 3.5–5.0)
Alkaline Phosphatase: 86 U/L (ref 38–126)
Anion gap: 10 (ref 5–15)
BUN: 22 mg/dL (ref 8–23)
CO2: 24 mmol/L (ref 22–32)
Calcium: 7.9 mg/dL — ABNORMAL LOW (ref 8.9–10.3)
Chloride: 107 mmol/L (ref 98–111)
Creatinine, Ser: 0.46 mg/dL (ref 0.44–1.00)
GFR, Estimated: >60 mL/min (ref >60)
Glucose, Bld: 186 mg/dL — ABNORMAL HIGH (ref 70–99)
Potassium: 4.2 mmol/L (ref 3.5–5.1)
Sodium: 141 mmol/L (ref 135–145)
Total Bilirubin: <0.1 mg/dL — ABNORMAL LOW (ref 0.3–1.2)
Total Protein: 4.7 g/dL — ABNORMAL LOW (ref 6.5–8.1)

## 2020-10-26 LAB — LACTIC ACID, PLASMA: Lactic Acid, Venous: 1.5 mmol/L (ref 0.5–1.9)

## 2020-10-26 LAB — VITAMIN B12: Vitamin B-12: 1884 pg/mL — ABNORMAL HIGH (ref 180–914)

## 2020-10-26 LAB — MAGNESIUM: Magnesium: 2.1 mg/dL (ref 1.7–2.4)

## 2020-10-26 MED ORDER — MIDODRINE HCL 5 MG PO TABS
15.0000 mg | ORAL_TABLET | Freq: Three times a day (TID) | ORAL | Status: DC
  Administered 2020-10-26 – 2020-10-27 (×3): 15 mg
  Filled 2020-10-26 (×3): qty 3

## 2020-10-26 MED ORDER — LACTATED RINGERS IV BOLUS
500.0000 mL | Freq: Once | INTRAVENOUS | Status: AC
  Administered 2020-10-26: 500 mL via INTRAVENOUS

## 2020-10-26 NOTE — Progress Notes (Addendum)
Subjective:   Yesterday afternoon and evening had continued issues with hypotension. Given total of 2.5L in fluid boluses for pressure support yesterday. Also restarted fludocortisone and increased dose of solu-cortef. BP stable overnight, but became hypotensive again this morning. Giving further fluid boluses for support pressure.  Yesterday evening while son present, patient was more active. He states this is typical for her, and that she is usually more active later in the day.  This morning, her eyes open spontaneously but does not respond meaningfully to verbal stimuli.   Objective:  Vital signs in last 24 hours: Vitals:   10/26/20 0235 10/26/20 0422 10/26/20 0500 10/26/20 0640  BP: (!) 105/44 (!) 103/47  (!) 82/42  Pulse: (!) 53 (!) 52  (!) 54  Resp: 17 16    Temp: 98.2 F (36.8 C) 98.1 F (36.7 C)  98.3 F (36.8 C)  TempSrc: Oral Oral  Oral  SpO2: 100% 100%  98%  Weight:   57.4 kg   Height:        Physical Exam Constitutional: no acute distress Head: atraumatic ENT: external ears normal Eyes: EOMI Cardiovascular: bradycardic, regular rhythm, normal heart sounds, nonpitting pedal edema Pulmonary: effort normal, lungs clear to ascultation bilaterally though exam limited by inability to follow commands to take deep breaths Abdominal: flat, nontender, bowel sounds normal Skin: warm and dry Neurological: obtunded, occasionally opens eyes to voice but other meaningful responses, does not follow any verbal commands  Assessment/Plan: Janet Gutierrez is a 75 y.o. female with hx of vascular dementia, prior stroke, dysphagia, and seizure disorder presenting with septic shock 2/2 pneumonia and possibly UTI. Had extended ICU stay and completed 7 days of Abx, transferred to our service on 1/26. Has remained persistently hypotensive despite midodrine and solu-cortef. Receiving nutrition through NG tube.  Principal Problem:   Severe sepsis (HCC) Active Problems:   Alzheimer's  disease (HCC)   Altered mental status   Seizures (HCC)   COVID-19 virus infection   Pneumonia   Dysphagia   Pressure injury of skin   Protein-calorie malnutrition, severe  Septic shock 2/2 pneumonia vs UTI Acute hypoxic respiratory failure - resolved Presented in septic shock, CXR with pneumonia and urine culture grew strep agalactiae and staph simulans. Treated with 7 days Zosyn. Fever and white count have resolved. Saturating well on room air. Required Levophed and fludrocortisol. Now transferred out of ICU, but still requiring high doses of midodrine, stress dose steroids, and fluid boluses to maintain blood pressure. Continues to have hypotension yesterday evening and this morning. SBP < 100 typically and MAP typically 50s-60s. EKG with sinus bradycardia. Echo with decreased pericardial effusion and no tamponade. Possibly neurogenic in etiology.  - continue monitoring. IV fluid boluses as needed. 2.5L of fluid administered yesterday. without overt fluid overload though noted to have low UOP. Saturating 100% on RA - Bladder scan <250cc, will repeat later this afternoon - restart fludrocortisone 0.1mg  for concern of adrenal insufficiency - increase solu-cortef to 50mg  q6hr - decrease midodrine to 15mg  TID this afternoon to see if heart rate improves - f/u lactic acid  Bradycardia Due to prior septic shock as above vs midodrine use. Will try to wean midodrine as tolerated, but she is requiring high doses to support her blood pressure, and even with that is requiring occasional fluid boluses. No beta blocker or CCB. - continue cardiac monitor - midodrine as above  Acute on chronic encephalopathy Vascular dementia Goals of care Per son, she is "not 100%" but not  far from her baseline, has history of vascular dementia. Likely some contribution from delirium, toxic/metabolid encephalopathy, progressive functional decline.  At baseline, she is unable to communicate or understand most words,  but does respond to tactile and other cues that they have developed over the years. She has been in a similar state since 2013. His goal is to take her home and give her as much length of life as possibly.  - palliative care consulted, appreciate assistance - will repeat goals of care discussion tomorrow in person  Requires enteric nutrition She is currently receiving adequate nutrition through tube feeds. Greatly appreciate assistance from SLP and nutritionist. On most recent SLP eval, son provided high level of assistance and swallowed 2 boluses of Ensure, then stopped accepting boluses. Son states that she sometimes gets tired like this at home, and he wants to re-evaluate her later in the afternoon when she is more alert. Discussed with son that this is insufficient to support nutritional needs, but he thinks she will do better when she is more alert in the afternoon/evening. He will return tomorrow to meet with SLP again. - continue feeds through Cortrak per nutrition: osmolite 50cc/hr, prosource 15ml BID, and free water flushes - SLP following, continue NPO for now - CMP daily  Acute on chronic anemia Baseline is around 11, she has been stable between 9-10 this admission. Ferritin 250, iron 60, TIBC 291.  - trend CBC   Diet:  NPO, tube feeds IVF:  Normal saline VTE:  SCDs Prior to Admission Living Arrangement:  home Anticipated Discharge Location:  Home per family preference Barriers to Discharge:  Hypotension, nutritional needs Dispo: Anticipated discharge pending medical workup.   Remo Lipps, MD 10/26/2020, 7:03 AM Pager: [phone] After 5pm on weekdays and 1pm on weekends: On Call pager [phone]

## 2020-10-26 NOTE — Progress Notes (Signed)
Speech Language Pathology Treatment: Dysphagia  Patient Details Name: Janet Gutierrez MRN: [id number] DOB: [birth year] Today's Date: 10/26/2020 Time: 7096-2836 SLP Time Calculation (min) (ACUTE ONLY): 30 min  Assessment / Plan / Recommendation Clinical Impression  Pt was seen again this afternoon with her son, Janet Gutierrez, present. We provided repositioning and environmental changes to optimize swallowing conditions, but pt remains lethargic. Quinton provided Max-Total A throughout trials. He provides physical assist to hold her head upright and physically raises her mandible to closer her mouth around a straw. Pt occasionally initiates sucking in response to this very heavy cueing, but inconsistently elicits a swallow. SLP also provided verbal/tactile cueing to try to facilitate swallowing. Immediate coughing was noted with thin liquids immediately after a swallow. When she didn't swallow, Quinton questioned whether or not she had gotten any liquid out of the straw, but a delayed, wet cough is concerning for decreased airway protection in the absence of swallow initiation.   Dr. Bridgett Larsson also arrived to the room as SLP was working with the pt, seeing a portion of PO trials but also staying present during education. SLP provided additional education about signs of aspiration observed and concern for inadequate nutrition, particularly when pt is not showing much awareness. Janet Gutierrez says that again the timing of this visit is not optimal, and that she is too lethargic. He says that the time we met yesterday is more of a normal meal time for her, but we also aren't getting her OOB for trials. He voices additional concerns again, such as how medications have impacted her mentation here.   SLP again educated pt about the potential to initiate comfort feeding, so that he can offer POs when she is alert and more interactive with him. It would also be something that he could provide her with at home, since he  reiterates that it is his goal to get her back home and to [facility]. I reinforced that while it is possible for her to do better in her normal routine/environment and when she is more alert, this also isn't a guarantee. There are also risks involved with starting comfort feeds, which were reviewed with Quinton. He remains hopeful that his mother will be able to return to baseline upon return home. Dr. Bridgett Larsson reiterated that after a significant event such as what she has just been through, there is no guarantee for this. After this conversation it sounds like Janet Gutierrez is wanting to get her home with resumption of baseline PO diet. He was left with Dr. Bridgett Larsson to further discuss [place], as this acceptance of risk has other implications with her overall care.   SLP will leave pt NPO at this time pending further [place] conversations; however, if he does decide to accept aspiration risk, would initiate Dys 1 diet and thin liquids with full supervision and careful assistance with meals. POs should also only be offered when pt is fully alert and accepting.    HPI HPI: 75 year old female w/ hx of vascular dementia, seizures, known COVID positive sent from assisted living with AMS, difficulty swallowing, hypoxia, and fever.  Found to have pneumonia and possible UTI, ongoing hypotension in ER despite IVFs.  Hx CVA at age 60. Now with septic shock secondary to pna, likely aspiration; encephalopathy.      SLP Plan  Continue with current plan of care       Recommendations  Diet recommendations: NPO (unless family wanting to pursue comfort feeds) Medication Administration: Via alternative means  Oral Care Recommendations: Oral care QID Follow up Recommendations: Skilled Nursing facility SLP Visit Diagnosis: Dysphagia, unspecified (R13.10) Plan: Continue with current plan of care       GO                Osie Bond., M.A. [place] [facility] Pager [phone] Office  [phone]  10/26/2020, 3:20 PM

## 2020-10-26 NOTE — Progress Notes (Signed)
   10/26/20 1000  Assess: MEWS Score  BP (!) 95/49  Pulse Rate (!) 56  ECG Heart Rate (!) 52  Resp 13  Level of Consciousness Alert  SpO2 100 %  O2 Device Room Air  Assess: MEWS Score  MEWS Temp 0  MEWS Systolic 1  MEWS Pulse 0  MEWS RR 1  MEWS LOC 0  MEWS Score 2  MEWS Score Color Yellow  Assess: if the MEWS score is Yellow or Red  Were vital signs taken at a resting state? Yes  Focused Assessment No change from prior assessment  Early Detection of Sepsis Score *See Row Information* Medium  MEWS guidelines implemented *See Row Information* Yes  Treat  MEWS Interventions Escalated (See documentation below)  Pain Scale Faces  Pain Score 0  Take Vital Signs  Increase Vital Sign Frequency  Yellow: Q 2hr X 2 then Q 4hr X 2, if remains yellow, continue Q 4hrs  Escalate  MEWS: Escalate Yellow: discuss with charge nurse/RN and consider discussing with provider and RRT  Notify: Charge Nurse/RN  Name of Charge Nurse/RN Notified Velia RN  Date Charge Nurse/RN Notified 10/26/20  Time Charge Nurse/RN Notified 1056  Notify: Provider  Provider Name/Title Internal Medicine team  Date Provider Notified 10/26/20  Time Provider Notified 1015  Notification Type Face-to-face  Notification Reason Change in status  Response See new orders  Date of Provider Response 10/26/20  Time of Provider Response 1015  Notify: Rapid Response  Name of Rapid Response RN Notified N/A  Document  Patient Outcome Not stable and remains on department  Progress note created (see row info) Yes

## 2020-10-26 NOTE — Plan of Care (Signed)
  Problem: Clinical Measurements: Goal: Respiratory complications will improve Outcome: Progressing   Problem: Clinical Measurements: Goal: Ability to maintain clinical measurements within normal limits will improve Outcome: Progressing   

## 2020-10-26 NOTE — Evaluation (Signed)
Occupational Therapy Evaluation Patient Details Name: Janet Gutierrez MRN: [id number] DOB: April 24, 1946 Today's Date: 10/26/2020    History of Present Illness 75 y.o. 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 Known covid positive status 2+ weeks ago. Pt from [place] where she had a fever, with decreased responsiveness and inability to swallow. Only at [facility] for respite care as sone and wife her main caregivers went on a cruise. Admitted 10/13/20 with altered mental status/toxic metabolic encephalopathy in the setting of COVID-19 infection and possible sepsis EEG suggestive of toxic metabolic encephalopathy   Clinical Impression   Unfortunate 75 yo F admitted for the above diagnosis.  Patient did not participate in any portion of the OT assessment this date.  Very little eye contact, and no command following noted.  Patient appears to be at, or near her baseline function.  No acute OT needs.  Encouraged staff to perform PROM during self care, and continue changes in position for skin integrity.  As per PT, if son is willing, and able to perform 24 hour dependent care, and has needed DME, home is an option.  Otherwise, SNF is recommended.      Follow Up Recommendations  SNF    Equipment Recommendations  Hospital bed, [facility] lift.      Recommendations for Other Services       Precautions / Restrictions Precautions Precautions: Fall Restrictions Other Position/Activity Restrictions: contractures and skin integrity      Mobility Bed Mobility Overal bed mobility: Needs Assistance Bed Mobility: Rolling Rolling: Total assist;+2 for physical assistance              Transfers                 General transfer comment: pt would need hoyer lift, unsure if she has the flexibility to tolerate upright  sitting.    Balance                                           ADL either performed or assessed with clinical judgement   ADL Overall ADL's : At baseline                                       General ADL Comments: Dep for all care     Vision         Perception     Praxis      Pertinent Vitals/Pain Pain Assessment: Faces Faces Pain Scale: Hurts a little bit Pain Location: intermittent grimacing, during repositioning and nail bed squeeze. Pain Descriptors / Indicators: Grimacing Pain Intervention(s): Monitored during session     Hand Dominance     Extremity/Trunk Assessment Upper Extremity Assessment RUE Deficits / Details: lacking full PROM, tone with elbow extension and probable contracture in shoulder LUE Deficits / Details: lacking full PROM, tone with elbow extension and shoulder flexion,   Lower Extremity Assessment Lower Extremity Assessment: Defer to PT evaluation   Cervical / Trunk Assessment Cervical / Trunk Assessment: Kyphotic   Communication Communication Communication: Expressive difficulties   Cognition Arousal/Alertness: Awake/alert Behavior During Therapy: Flat affect Overall Cognitive Status: History of cognitive impairments - at baseline  General Comments: Continues as documented.  eyes open but no engagement, no command follow, no eye contact with therapist, but was tracking nurse in room.  resistance to movement   General Comments       Exercises     Shoulder Instructions      Home Living Family/patient expects to be discharged to:: Skilled nursing facility                                        Prior Functioning/Environment Level of Independence: Needs assistance  Gait / Transfers Assistance Needed: patient appears to be bedbound.  chart suggests patient's son lifts her from surface to surface. ADL's / Homemaking Assistance Needed:  Patient is on a feeding tube, no efforts to attempt any functional tasks.  Chart states son completes all self care, toileting and self feeding.            OT Problem List: Decreased range of motion;Decreased cognition      OT Treatment/Interventions:      OT Goals(Current goals can be found in the care plan section) Acute Rehab OT Goals Patient Stated Goal: none stated OT Goal Formulation: Patient unable to participate in goal setting Time For Goal Achievement: 10/26/20 Potential to Achieve Goals: Fair  OT Frequency:     Barriers to D/C:            Co-evaluation              AM-PAC OT "6 Clicks" Daily Activity     Outcome Measure Help from another person eating meals?: Total Help from another person taking care of personal grooming?: Total Help from another person toileting, which includes using toliet, bedpan, or urinal?: Total Help from another person bathing (including washing, rinsing, drying)?: Total Help from another person to put on and taking off regular upper body clothing?: Total Help from another person to put on and taking off regular lower body clothing?: Total 6 Click Score: 6   End of Session Nurse Communication: Need for lift equipment  Activity Tolerance: Other (comment) Patient left: in bed;with call bell/phone within reach;with nursing/sitter in room  OT Visit Diagnosis: Adult, failure to thrive (R62.7)                Time: 0174-9449 OT Time Calculation (min): 22 min Charges:  OT General Charges $OT Visit: 1 Visit OT Evaluation $OT Eval Moderate Complexity: 1 Mod  10/26/2020  Rich, OTR/L  Acute Rehabilitation Services  Office:  [phone]   Suzanna Obey 10/26/2020, 3:41 PM

## 2020-10-26 NOTE — Progress Notes (Signed)
  Echocardiogram 2D Echocardiogram has been performed.  Janet Gutierrez 10/26/2020, 8:43 AM

## 2020-10-26 NOTE — Progress Notes (Signed)
Received a call from team MD that  RN should  recheck pt,and  BP 85/45 MAP 57    BP check manually and remained low at 74/41  RRT Nurse and MD paged, noted new orders LR 500 bolus given and as pt being resuscitation with fluid    CCMD at 0700 notified pt hr down to  30 bpm unsustained. RN and RRT at pt's bedside monitoring pt when the call came in  Hr up to 43bpm then in mid 50.s as this has been pt.s baseline. There was no acute change in pt;s neuro status  from baseline.

## 2020-10-26 NOTE — Progress Notes (Signed)
Spoke with son at bedside regarding goals of care.  His goal is to take his mother home and have her live as long as possible. He understands that this was a significant event, but is hopeful that she will go back to her baseline.  He states that she looks tired, but that she is mentally not far from her baseline. States that she is normally tired during the day, and more active at night. He notes that she often takes several days to perk back up when she goes to respite care when he goes on vacation, etc. He has been taking care of her since 2013.  Regarding her nutritional status, he feels that they will be able to manage her nutrition at home. He believes that she will eat better at home in a familiar environment, and that they have figured out ways to cue her to eat. He is agreeable to continuing tube feeds here, and would want to attempt PO nutrition when she goes home.  Discussed the difficulty maintaining her blood pressure. He endorses understanding. He believes that she will do better when she goes home because she will have more stimulation. Would be agreeable to going home without IV medications after some more time and seeing how she does, but wants [place] to continue current support for now.  Also discussed code status. Discussed that CPR can be painful and traumatic, and in my opinion would do more harm than good at this time. He wishes her to continue being full code in the case of a cardiac arrest.

## 2020-10-27 LAB — COMPREHENSIVE METABOLIC PANEL
ALT: 33 U/L (ref 0–44)
AST: 23 U/L (ref 15–41)
Albumin: 2 g/dL — ABNORMAL LOW (ref 3.5–5.0)
Alkaline Phosphatase: 85 U/L (ref 38–126)
Anion gap: 7 (ref 5–15)
BUN: 21 mg/dL (ref 8–23)
CO2: 27 mmol/L (ref 22–32)
Calcium: 8 mg/dL — ABNORMAL LOW (ref 8.9–10.3)
Chloride: 106 mmol/L (ref 98–111)
Creatinine, Ser: 0.44 mg/dL (ref 0.44–1.00)
GFR, Estimated: >60 mL/min (ref >60)
Glucose, Bld: 156 mg/dL — ABNORMAL HIGH (ref 70–99)
Potassium: 3.8 mmol/L (ref 3.5–5.1)
Sodium: 140 mmol/L (ref 135–145)
Total Bilirubin: 0.3 mg/dL (ref 0.3–1.2)
Total Protein: 4.9 g/dL — ABNORMAL LOW (ref 6.5–8.1)

## 2020-10-27 LAB — GLUCOSE, CAPILLARY
Glucose-Capillary: 167 mg/dL — ABNORMAL HIGH (ref 70–99)
Glucose-Capillary: 190 mg/dL — ABNORMAL HIGH (ref 70–99)
Glucose-Capillary: 93 mg/dL (ref 70–99)
Glucose-Capillary: 139 mg/dL — ABNORMAL HIGH (ref 70–99)
Glucose-Capillary: 154 mg/dL — ABNORMAL HIGH (ref 70–99)

## 2020-10-27 LAB — CBC
HCT: 25.8 % — ABNORMAL LOW (ref 36.0–46.0)
Hemoglobin: 8.5 g/dL — ABNORMAL LOW (ref 12.0–15.0)
MCH: 33.5 pg (ref 26.0–34.0)
MCHC: 32.9 g/dL (ref 30.0–36.0)
MCV: 101.6 fL — ABNORMAL HIGH (ref 80.0–100.0)
Platelets: 535 10*3/uL — ABNORMAL HIGH (ref 150–400)
RBC: 2.54 MIL/uL — ABNORMAL LOW (ref 3.87–5.11)
RDW: 17.1 % — ABNORMAL HIGH (ref 11.5–15.5)
WBC: 6.4 10*3/uL (ref 4.0–10.5)
nRBC: 1.1 % — ABNORMAL HIGH (ref 0.0–0.2)

## 2020-10-27 MED ORDER — LACTATED RINGERS IV BOLUS
500.0000 mL | Freq: Once | INTRAVENOUS | Status: AC
  Administered 2020-10-27: 500 mL via INTRAVENOUS

## 2020-10-27 MED ORDER — CHLORHEXIDINE GLUCONATE 0.12 % MT SOLN
OROMUCOSAL | Status: AC
  Filled 2020-10-27: qty 15

## 2020-10-27 MED ORDER — MIDODRINE HCL 5 MG PO TABS
10.0000 mg | ORAL_TABLET | Freq: Three times a day (TID) | ORAL | Status: DC
  Administered 2020-10-27 – 2020-10-28 (×4): 10 mg
  Filled 2020-10-27 (×4): qty 2

## 2020-10-27 NOTE — Discharge Summary (Addendum)
Name: Janet Gutierrez MRN: [id number] DOB: [birth year] 75 y.o. PCP: Patient, No Pcp Per  Date of Admission: 10/13/2020 12:59 PM Date of Discharge:  11/03/20 Attending Physician: Tyson Alias, *   Discharge Diagnosis: Sepsis secondary to community-acquired pneumonia Acute hypoxic respiratory failure Bradycardia End stage Vascular dementia  Discharge Medications: Allergies as of 11/03/2020   No Known Allergies      Medication List     STOP taking these medications    guaifenesin 100 MG/5ML syrup Commonly known as: ROBITUSSIN   lactulose 10 GM/15ML solution Commonly known as: CHRONULAC       TAKE these medications    ENSURE ENLIVE PO Take 8 fluid ounces by mouth 3 (three) times daily.   famotidine 10 MG tablet Commonly known as: PEPCID Take 10 mg by mouth daily.   levETIRAcetam 500 MG tablet Commonly known as: Keppra Take 1 tablet (500 mg total) by mouth 2 (two) times daily.   midodrine 5 MG tablet Commonly known as: PROAMATINE Take 1 tablet (5 mg total) by mouth 3 (three) times daily with meals.   multivitamin with minerals Tabs tablet Take 1 tablet by mouth daily.   sennosides-docusate sodium 8.6-50 MG tablet Commonly known as: SENOKOT-S Take 1 tablet by mouth in the morning and at bedtime.        Disposition and follow-up:   Janet Gutierrez was discharged from [facility] in [place] condition.  At the hospital follow up visit please address:  End stage dementia. Cortrak was in place for the majority of her hospitalization however her son felt that she has returned to her baseline. Unfortunately, I do not feel that her condition is reversible and her disease will continue to progress. We had extensive discussions with her son regarding her high risk for aspiration however he feels that he can manage this well at home. -Please assess her ability to tolerate her oral medications at time of follow up.  -Continue discussion  surrounding goals of care and end of life expectations.  Hypotension. Initially, this was in the setting of sepsis however persisted throughout her hospitalization. Solumedrol and florinef were able to be weaned off toward the end of her hospitalization however she remains on midodrine.  -New medication: midodrine 5mg  three times daily  -Reassess her need for continued midodrine at time of hospital follow up  2.  Labs / imaging needed at time of follow-up: None  3.  Pending labs/ test needing follow-up: None  Follow-up Appointments:  Follow-up , [facility] Follow up in 1 week(s).   Contact information: [address] [place] [phone]                 Hospital Course by problem list:  Septic shock 2/2 pneumonia vs UTI Acute hypoxic respiratory failure Hypotension Presented in septic shock, CXR with pneumonia and urine culture grew strep agalactiae and staph simulans. Admitted to ICU and Required Levophed and stress dose steroids. Treated with 7 days Zosyn, fever and white count resolved. Also weaned to room air and saturating well. Transferred out of ICU, but still required high doses of midodrine as well as Solu-cortef and fludrocortisone, as well as fluid boluses throughout the day. Initial echo showed some pericardial effusion, but repeat echo with decreased volume of that and no sign of tamponade. Blood pressure remained soft after weaning of steroids. She remained on Midodrine at discharge.   Bradycardia Noted to  have profound bradycardia in 30s-40s, but this gradually improved as midodrine was weaned. Heart rate was persistently in the 50s til day of discharge.    End stage Vascular dementia Patient remained persistently encephalopathic, does not respond to verbal stimuli or follow verbal commands. Son states that at baseline, she is unable to communicate or understand most words, but does respond to  tactile and other cues that they have developed over the years. She does seem more active in the evenings. SLP recommended NPO due to persistently poor swallow. Son assists very heavily with nutrition at home, spoon feeding her and prompting her to chew with tactile cues. Received nutrition through tube feeds during admission, and son feels that he can help her take PO adequately at home. PT recommended SNF for patient but son expressed his desire to take her home. Medical team expressed concerns about discharging home due to patient's high risk of aspiration. Son continued to express desire to take patient home regardless of the medical recommendation to discharge to SNF or home with hospice. Patient started to swallow pureed food with stimulation on SLP eval on two consecutive days. After further discussions with palliative care, it was decided to respect the son's wishes to discharge patient home as no further interventions were going to improve patient's functional status. Cortrak was removed on 2/4 and patient was discharged on 2/5  She is high risk for readmission secondary to her high risk for aspiration and poor nutritional intake however it is not felt that ongoing hospitalization will diminish these risks. The medical team, including palliative care, agree that aggressive or invasive measures would be futile in the setting of her end stage dementia.    Subjective on day of discharge: No significant changes overnight.   Discharge Exam:   BP (!) 110/42 (BP Location: Left Leg)   Pulse (!) 52   Temp 98.1 F (36.7 C) (Oral)   Resp 14   Ht 5\' 4"  (1.626 m)   Wt 59.8 kg   SpO2 97%   BMI 22.63 kg/m   Discharge exam: General: chronically ill appearing, resting comfortably Cardiac: extremities warm Pulm: breathing comfortably on room air.  Neuro: does not awaken to voice or physical stimulation.   Pertinent Labs, Studies, and Procedures:   CT Head Wo Contrast Result Date:  10/13/2020 IMPRESSION: 1. No acute findings. 2. Mild atrophic change and chronic ischemic microvascular disease. 3. Stable right occipital craniectomy with underlying encephalomalacia over the right cerebellar hemisphere. 4. Minimal chronic sinus inflammatory change.  CT ANGIO CHEST PE W OR WO CONTRAST Result Date: 10/13/2020 CLINICAL DATA:  Shortness of breath, COVID positive EXAM: CT ANGIOGRAPHY IMPRESSION: No central, segmental, or subsegmental pulmonary embolism. Mucous plugging within the left mainstem bronchus and throughout the bronchials. There is near complete consolidation throughout the left lung which could be due to atelectasis and/or multifocal pneumonia Small loculated left pleural effusion Patchy airspace opacity in the posterior right lung base which could be due to aspiration or pneumonia.  EEG adult IMPRESSION: This study showed evidence of epileptogenicity arising from right >left parieto occipital region due to underlying structural abnormality. The periodic epileptiform discharges are at 1Hz  and at times rhythmic which is on the ictal-interictal continuum with high potential for seizure. There is also moderate diffuse encephalopathy, non specific etiology. No seizures were seen throughout the recording.    BMP Latest Ref Rng & Units 11/02/2020 11/01/2020 10/31/2020  Glucose 70 - 99 mg/dL 99 81 12/30/2020)  BUN 8 - 23 mg/dL 20  18 20  Creatinine 0.44 - 1.00 mg/dL 1.24(P) 8.09 9.83  Sodium 135 - 145 mmol/L 144 147(H) 145  Potassium 3.5 - 5.1 mmol/L 3.7 3.6 3.6  Chloride 98 - 111 mmol/L 105 107 106  CO2 22 - 32 mmol/L 30 30 30   Calcium 8.9 - 10.3 mg/dL 7.9(L) 8.0(L) 8.0(L)   CBC Latest Ref Rng & Units 11/02/2020 11/01/2020 10/31/2020  WBC 4.0 - 10.5 K/uL 5.3 4.8 4.6  Hemoglobin 12.0 - 15.0 g/dL 12/29/2020) 8.9(L) 9.0(L)  Hematocrit 36.0 - 46.0 % 28.7(L) 28.7(L) 27.9(L)  Platelets 150 - 400 K/uL 298 343 391     Discharge Instructions: Follow up with [facility] in 3-5 days.  Signed: 3.8(S, MD Internal Medicine Resident PGY-2 [facility] Pager: [phone] 11/03/2020 11:10 AM

## 2020-10-27 NOTE — Progress Notes (Signed)
Subjective:   Patient occasionally tracks appropriately and responds to physical stimuli, still is wide to verbal stimuli.  Per interview with son yesterday, this is not unusual for her.  Objective:  Vital signs in last 24 hours: Vitals:   10/27/20 0300 10/27/20 0400 10/27/20 0439 10/27/20 0452  BP: (!) 88/45 (!) 100/51 (!) 97/42   Pulse: (!) 52 (!) 54 (!) 56   Resp: 13 13 18    Temp:  98.4 F (36.9 C) 98.4 F (36.9 C)   TempSrc:  Oral Oral   SpO2: 98% 98% 98%   Weight:    60.7 kg  Height:        Physical Exam Constitutional: no acute distress Head: atraumatic ENT: external ears normal Eyes: EOMI Cardiovascular: Regular rate, regular rhythm, normal heart sounds, nonpitting pedal edema, right hand is also swollen but has vascular access immediately adjacent Pulmonary: effort normal, lungs clear to ascultation bilaterally though exam limited by inability to follow commands to take deep breaths Abdominal: flat, nontender, bowel sounds normal Skin: warm and dry Neurological: occasionally opens eyes to voice but no other meaningful responses, does not follow any verbal commands  Assessment/Plan: Janet Gutierrez is a 75 y.o. female with hx of vascular dementia, prior stroke, dysphagia, and seizure disorder presenting with septic shock 2/2 pneumonia and possibly UTI. Had extended ICU stay and completed 7 days of Abx, transferred to our service on 1/26. Has remained persistently hypotensive despite midodrine, Solu-Cortef, and fludrocortisone.  Receiving treatment through NG tube, speech still recommending n.p.o. based on her ability to swallow.  Principal Problem:   Severe sepsis (HCC) Active Problems:   Alzheimer's disease (HCC)   Altered mental status   Seizures (HCC)   COVID-19 virus infection   Pneumonia   Dysphagia   Pressure injury of skin   Protein-calorie malnutrition, severe  Septic shock 2/2 pneumonia vs UTI Acute hypoxic respiratory failure -  resolved Presented in septic shock, CXR with pneumonia and urine culture grew strep agalactiae and staph simulans. Treated with 7 days Zosyn. Fever and white count have resolved. Saturating well on room air. Required Levophed and fludrocortisol. Now transferred out of ICU, but still requiring high doses of midodrine, stress dose steroids, fludrocortisone, and fluid boluses to maintain blood pressure. Continues to have hypotension yesterday evening, somewhat improved this morning. EKG with sinus bradycardia.  Repeat echo with decreased pericardial effusion and no tamponade.  Normal lactic acid which is reassuring.  Possibly neurogenic in etiology.  - continue monitoring. IV fluid boluses as needed. 1L of fluid administered yesterday. without overt fluid overload and having fair UOP. Saturating 100% on RA -Continue fludrocortisone 0.1mg  for concern of adrenal insufficiency -Continue solu-cortef to 50mg  q6hr - decrease midodrine to 10mg  TID this afternoon, will wean as blood pressure tolerates  Bradycardia -improving Due to prior septic shock as above vs midodrine use. Will try to wean midodrine as tolerated, but she is requiring high doses to support her blood pressure, and even with that is requiring occasional fluid boluses.  Rate is in the 60s today as we have decreased her midodrine. - continue cardiac monitor - midodrine as above  Acute on chronic encephalopathy Vascular dementia Goals of care Per son, she is "not 100%" but not far from her baseline, has history of vascular dementia. Likely some contribution from delirium, toxic/metabolid encephalopathy, progressive functional decline.  At baseline, she is unable to communicate or understand most words, but does respond to tactile and other cues that they have developed over  the years. She has been in a similar state since 2013. His goal is to take her home and give her as much length of life as possible.  - palliative care consulted, appreciate  assistance - will continue goals of care discussion  Requires enteric nutrition She is currently receiving adequate nutrition through tube feeds. Greatly appreciate assistance from SLP and nutritionist. On most recent SLP eval, son provided high level of assistance and swallowed 2 boluses of Ensure, then stopped accepting boluses. Son states that she sometimes gets tired like this at home, and he wants to re-evaluate her later in the afternoon when she is more alert. Discussed with son that this is insufficient to support nutritional needs, but he thinks she will do better when she is more alert in the afternoon/evening. He will return tomorrow to meet with SLP again. - continue feeds through Cortrak per nutrition: osmolite 50cc/hr, prosource 4ml BID, and free water flushes - SLP following, continue NPO for now - CMP daily  Acute on chronic anemia Baseline is around 11, she has been stable between 9-10 this admission. Ferritin 250, iron 60, TIBC 291.  - trend CBC   Diet:  NPO, tube feeds IVF:  Normal saline VTE:  SCDs Prior to Admission Living Arrangement:  home Anticipated Discharge Location:  Home per family preference Barriers to Discharge:  Hypotension, nutritional needs Dispo: Anticipated discharge pending medical workup.   Remo Lipps, MD 10/27/2020, 6:45 AM Pager: [phone] After 5pm on weekdays and 1pm on weekends: On Call pager [phone]

## 2020-10-28 DIAGNOSIS — D509 Iron deficiency anemia, unspecified: Secondary | ICD-10-CM

## 2020-10-28 DIAGNOSIS — R001 Bradycardia, unspecified: Secondary | ICD-10-CM

## 2020-10-28 DIAGNOSIS — M25461 Effusion, right knee: Secondary | ICD-10-CM

## 2020-10-28 LAB — GLUCOSE, CAPILLARY
Glucose-Capillary: 119 mg/dL — ABNORMAL HIGH (ref 70–99)
Glucose-Capillary: 120 mg/dL — ABNORMAL HIGH (ref 70–99)
Glucose-Capillary: 125 mg/dL — ABNORMAL HIGH (ref 70–99)
Glucose-Capillary: 129 mg/dL — ABNORMAL HIGH (ref 70–99)
Glucose-Capillary: 132 mg/dL — ABNORMAL HIGH (ref 70–99)
Glucose-Capillary: 149 mg/dL — ABNORMAL HIGH (ref 70–99)
Glucose-Capillary: 177 mg/dL — ABNORMAL HIGH (ref 70–99)

## 2020-10-28 LAB — BASIC METABOLIC PANEL
Anion gap: 8 (ref 5–15)
BUN: 21 mg/dL (ref 8–23)
CO2: 28 mmol/L (ref 22–32)
Calcium: 7.9 mg/dL — ABNORMAL LOW (ref 8.9–10.3)
Chloride: 106 mmol/L (ref 98–111)
Creatinine, Ser: 0.47 mg/dL (ref 0.44–1.00)
GFR, Estimated: >60 mL/min (ref >60)
Glucose, Bld: 149 mg/dL — ABNORMAL HIGH (ref 70–99)
Potassium: 3.8 mmol/L (ref 3.5–5.1)
Sodium: 142 mmol/L (ref 135–145)

## 2020-10-28 LAB — CBC
HCT: 28.2 % — ABNORMAL LOW (ref 36.0–46.0)
Hemoglobin: 8.8 g/dL — ABNORMAL LOW (ref 12.0–15.0)
MCH: 32.7 pg (ref 26.0–34.0)
MCHC: 31.2 g/dL (ref 30.0–36.0)
MCV: 104.8 fL — ABNORMAL HIGH (ref 80.0–100.0)
Platelets: 503 10*3/uL — ABNORMAL HIGH (ref 150–400)
RBC: 2.69 MIL/uL — ABNORMAL LOW (ref 3.87–5.11)
RDW: 17 % — ABNORMAL HIGH (ref 11.5–15.5)
WBC: 8.4 10*3/uL (ref 4.0–10.5)
nRBC: 0.6 % — ABNORMAL HIGH (ref 0.0–0.2)

## 2020-10-28 MED ORDER — MIDODRINE HCL 5 MG PO TABS: 5.0000 mg | ORAL_TABLET | Freq: Three times a day (TID) | ORAL | Status: DC

## 2020-10-28 MED ORDER — HYDROCORTISONE NA SUCCINATE PF 100 MG IJ SOLR
50.0000 mg | Freq: Three times a day (TID) | INTRAMUSCULAR | Status: DC
  Administered 2020-10-28: 50 mg via INTRAVENOUS
  Filled 2020-10-28: qty 2

## 2020-10-28 MED ORDER — MIDODRINE HCL 5 MG PO TABS
5.0000 mg | ORAL_TABLET | Freq: Three times a day (TID) | ORAL | Status: DC
  Administered 2020-10-28 – 2020-11-03 (×17): 5 mg via ORAL
  Filled 2020-10-28 (×17): qty 1

## 2020-10-28 MED ORDER — HYDROCORTISONE NA SUCCINATE PF 100 MG IJ SOLR
50.0000 mg | Freq: Two times a day (BID) | INTRAMUSCULAR | Status: DC
  Administered 2020-10-28 – 2020-10-31 (×6): 50 mg via INTRAVENOUS
  Filled 2020-10-28 (×6): qty 2

## 2020-10-28 MED ORDER — FLUDROCORTISONE ACETATE 0.1 MG PO TABS
0.0500 mg | ORAL_TABLET | Freq: Every day | ORAL | Status: DC
  Administered 2020-10-29 – 2020-10-30 (×2): 0.05 mg via ORAL
  Filled 2020-10-28 (×2): qty 0.5

## 2020-10-28 NOTE — Progress Notes (Signed)
Subjective:   Patient remains not verbally communicative. Does not respond to verbal stimuli except to open her eyes occasionally.  Objective:  Vital signs in last 24 hours: Vitals:   10/28/20 0200 10/28/20 0248 10/28/20 0400 10/28/20 0600  BP: (!) 109/51  (!) 111/49 (!) 102/51  Pulse:      Resp: 12  13 12   Temp:   98.4 F (36.9 C)   TempSrc:   Oral   SpO2: 98%  100% 100%  Weight:  59.6 kg    Height:        Physical Exam Constitutional: no acute distress Head: atraumatic ENT: external ears normal Eyes: EOMI Cardiovascular: Regular rate, regular rhythm, normal heart sounds, nonpitting pedal edema, right hand is also swollen and has vascular access immediately adjacent Pulmonary: effort normal, lungs clear to ascultation bilaterally though exam limited by inability to follow commands to take deep breaths Abdominal: flat, nontender, bowel sounds normal Skin: warm and dry Neurological: occasionally opens eyes to voice but no other meaningful responses, does not follow any verbal commands  Assessment/Plan: Janet Gutierrez is a 75 y.o. female with hx of vascular dementia, prior stroke, dysphagia, and seizure disorder presenting with septic shock 2/2 pneumonia and possibly UTI. Had extended ICU stay and completed 7 days of Abx, transferred to our service on 1/26. Has remained persistently hypotensive despite midodrine, Solu-Cortef, and fludrocortisone.  Receiving treatment through NG tube, speech still recommending n.p.o. based on her ability to swallow.  Principal Problem:   Severe sepsis (HCC) Active Problems:   Alzheimer's disease (HCC)   Altered mental status   Seizures (HCC)   COVID-19 virus infection   Pneumonia   Dysphagia   Pressure injury of skin   Protein-calorie malnutrition, severe  Septic shock 2/2 pneumonia vs UTI Acute hypoxic respiratory failure - resolved Presented in septic shock, CXR with pneumonia and urine culture grew strep agalactiae and staph  simulans. Treated with 7 days Zosyn. Fever and white count have resolved. Saturating well on room air. Required Levophed and fludrocortisol. Now transferred out of ICU, but still requiring midodrine, stress dose steroids, fludrocortisone. BP is better lately, did not require fluid boluses yesterday. Weaning medications. Normal lactic acid is reassuring. -decrease fludrocortisone to 0.05mg  daily -Decrease solu-cortef to 50mg  q12hr -decrease midodrine to 5mg  TID this afternoon, will wean as blood pressure tolerates  Bradycardia -improving Due to prior septic shock as above vs midodrine use.  Weaning midodrine as tolerated, blood pressure has been better recently.  Rate was in 60s yesterday, down to 50s today. - continue cardiac monitor - midodrine as above  Acute on chronic encephalopathy Vascular dementia Goals of care Per son, she is "not 100%" but not far from her baseline, has history of vascular dementia. Likely some contribution from delirium, toxic/metabolid encephalopathy, progressive functional decline.  At baseline, she is unable to communicate or understand most words, but does respond to tactile and other cues that they have developed over the years. She has been in a similar state since 2013. His goal is to take her home and give her as much length of life as possible.  - palliative care consulted, appreciate assistance - will continue goals of care discussion  Requires enteric nutrition She is currently receiving adequate nutrition through tube feeds. Greatly appreciate assistance from SLP and nutritionist. On most recent SLP eval, son provided high level of assistance and swallowed 2 boluses of Ensure, then stopped accepting boluses. Son states that she sometimes gets tired like this at home,  and he wants to re-evaluate her later in the afternoon when she is more alert. Discussed with son that this is insufficient to support nutritional needs, but he thinks she will do better when she  is more alert in the afternoon/evening. He will return tomorrow to meet with SLP again. - continue feeds through Cortrak per nutrition: osmolite 50cc/hr, prosource 56ml BID, and free water flushes - SLP following, continue NPO for now - CMP daily  Acute on chronic anemia Baseline is around 11, she has been stable between 9-10 this admission. Ferritin 250, iron 60, TIBC 291. MVC is elevated but B12 is not low. - trend CBC   Diet:  NPO, tube feeds IVF:  Normal saline VTE:  SCDs Prior to Admission Living Arrangement:  home Anticipated Discharge Location:  Home per family preference Barriers to Discharge:  Hypotension, nutritional needs Dispo: Anticipated discharge pending medical workup.   Remo Lipps, MD 10/28/2020, 6:35 AM Pager: [phone] After 5pm on weekdays and 1pm on weekends: On Call pager [phone]

## 2020-10-29 LAB — CBC
HCT: 27.3 % — ABNORMAL LOW (ref 36.0–46.0)
Hemoglobin: 9 g/dL — ABNORMAL LOW (ref 12.0–15.0)
MCH: 34 pg (ref 26.0–34.0)
MCHC: 33 g/dL (ref 30.0–36.0)
MCV: 103 fL — ABNORMAL HIGH (ref 80.0–100.0)
Platelets: 464 10*3/uL — ABNORMAL HIGH (ref 150–400)
RBC: 2.65 MIL/uL — ABNORMAL LOW (ref 3.87–5.11)
RDW: 17.2 % — ABNORMAL HIGH (ref 11.5–15.5)
WBC: 6 10*3/uL (ref 4.0–10.5)
nRBC: 1.2 % — ABNORMAL HIGH (ref 0.0–0.2)

## 2020-10-29 LAB — BASIC METABOLIC PANEL
Anion gap: 9 (ref 5–15)
BUN: 21 mg/dL (ref 8–23)
CO2: 28 mmol/L (ref 22–32)
Calcium: 8 mg/dL — ABNORMAL LOW (ref 8.9–10.3)
Chloride: 106 mmol/L (ref 98–111)
Creatinine, Ser: 0.48 mg/dL (ref 0.44–1.00)
GFR, Estimated: >60 mL/min (ref >60)
Glucose, Bld: 155 mg/dL — ABNORMAL HIGH (ref 70–99)
Potassium: 3.8 mmol/L (ref 3.5–5.1)
Sodium: 143 mmol/L (ref 135–145)

## 2020-10-29 LAB — GLUCOSE, CAPILLARY
Glucose-Capillary: 100 mg/dL — ABNORMAL HIGH (ref 70–99)
Glucose-Capillary: 119 mg/dL — ABNORMAL HIGH (ref 70–99)
Glucose-Capillary: 121 mg/dL — ABNORMAL HIGH (ref 70–99)
Glucose-Capillary: 139 mg/dL — ABNORMAL HIGH (ref 70–99)
Glucose-Capillary: 156 mg/dL — ABNORMAL HIGH (ref 70–99)
Glucose-Capillary: 94 mg/dL (ref 70–99)
Glucose-Capillary: 99 mg/dL (ref 70–99)

## 2020-10-29 NOTE — Progress Notes (Signed)
  Speech Language Pathology Treatment: Dysphagia  Patient Details Name: Janet Gutierrez MRN: [id number] DOB: 1946/02/04 Today's Date: 10/29/2020 Time: 6629-4765 SLP Time Calculation (min) (ACUTE ONLY): 14 min  Assessment / Plan / Recommendation Clinical Impression  Pt was seen this afternoon although without son present. We had not coordinate a time during our last visit and per RN, he had just left. SLP tried to come later in the day per his report of pt being most alert as the day goes on. This afternoon she does have her eyes open more than she did in previous visits. SLP repositioned her fully upright and provided preferred liquids via straw. SLP gave verbal and tactile cueing as demonstrated by Quinton, and while pt did repeatedly suck liquid up via straw, she would consistently then open her mouth to allow the entire bolus to escape from her mouth. She was not able to keep any of the liquids in her mouth to try to swallow, although mild residue that was mixed with her secretions was suctioned out of her mouth. SLP also attempted to provide vanilla pudding. Pt opened her mouth and started moving her tongue, but did not make any attempts to take the bite off the spoon. Despite Max cues, she did not swallow the pudding either. Will continue to leave NPO order in place, but if Quinton would like to initiate feeds with the acceptance of risks, would initiate Dys 1 diet and thin liquids with full supervision.    HPI HPI: 75 year old female w/ hx of vascular dementia, seizures, known COVID positive sent from assisted living with AMS, difficulty swallowing, hypoxia, and fever.  Found to have pneumonia and possible UTI, ongoing hypotension in ER despite IVFs.  Hx CVA at age 23. Now with septic shock secondary to pna, likely aspiration; encephalopathy.      SLP Plan  Continue with current plan of care       Recommendations  Diet recommendations: NPO Medication Administration: Via alternative  means                Oral Care Recommendations: Oral care QID Follow up Recommendations: Home health SLP;24 hour supervision/assistance (vs SNF if unable to provide level of assist) SLP Visit Diagnosis: Dysphagia, unspecified (R13.10) Plan: Continue with current plan of care       GO                Mahala Menghini., M.A. CCC-SLP Acute Rehabilitation Services Pager [phone] Office [phone]  10/29/2020, 4:37 PM

## 2020-10-29 NOTE — TOC Progression Note (Signed)
Transition of Care [facility]) - Progression Note    Patient Details  Name: Janet Gutierrez MRN: [id number] Date of Birth: May 13, 1946  Transition of [facility]) CM/SW Contact  Leone Haven, RN Phone Number: 10/29/2020, 8:03 PM  Clinical Narrative:    Son wants patient  to go home and continue rehab with  [facility] , she still has cortrak tube feeds in she can not swallow.  Speech is rec HHPT also to be added to rehab services.  Patient still with cortrak and not medically ready yet.    Expected Discharge Plan: Skilled Nursing Facility Barriers to Discharge: Continued Medical Work up  Expected Discharge Plan and Services Expected Discharge Plan: Skilled Nursing Facility       Living arrangements for the past 2 months: Single Family Home                                       Social Determinants of Health (SDOH) Interventions    Readmission Risk Interventions No flowsheet data found.

## 2020-10-29 NOTE — Progress Notes (Signed)
   10/28/20 2000  Assess: MEWS Score  Temp 98.5 F (36.9 C)  BP (!) 111/51  ECG Heart Rate (!) 50  Resp 13  SpO2 100 %  Assess: MEWS Score  MEWS Temp 0  MEWS Systolic 0  MEWS Pulse 1  MEWS RR 1  MEWS LOC 0  MEWS Score 2  MEWS Score Color Yellow  Assess: if the MEWS score is Yellow or Red  Were vital signs taken at a resting state? Yes  Focused Assessment No change from prior assessment  Early Detection of Sepsis Score *See Row Information* Low  MEWS guidelines implemented *See Row Information* No, previously yellow, continue vital signs every 4 hours  Treat  MEWS Interventions  (Continue Q4 vitals)  Faces Pain Scale 0  Escalate  MEWS: Escalate Yellow: discuss with charge nurse/RN and consider discussing with provider and RRT  Notify: Charge Nurse/RN  Name of Charge Nurse/RN Notified Fred RN  Date Charge Nurse/RN Notified 10/28/20  Time Charge Nurse/RN Notified 2015  Document  Patient Outcome Other (Comment) (Remains at baseline/stable)  Progress note created (see row info) Yes

## 2020-10-29 NOTE — Progress Notes (Signed)
Subjective:   Patient remains not verbally communicative. Opens eyes to voice and physical touch.   Objective:  Vital signs in last 24 hours: Vitals:   10/29/20 0000 10/29/20 0500 10/29/20 0744 10/29/20 0937  BP: (!) 108/54   (!) 104/44  Pulse:    (!) 54  Resp: 11   14  Temp: 98.4 F (36.9 C)  98.7 F (37.1 C)   TempSrc: Axillary  Axillary   SpO2: 99%   100%  Weight: 62 kg 62 kg    Height:        Physical Exam Constitutional: no acute distress Head: atraumatic ENT: external ears normal Eyes: EOMI Cardiovascular: bradycardia, regular rhythm, normal heart sounds, nonpitting pedal edema, right hand is also swollen and has vascular access immediately adjacent, NTTP, strong pulses UE bilaterally Pulmonary: effort normal, lungs clear to ascultation bilaterally though exam limited by inability to follow commands to take deep breaths MSK: right knee effusion, no wincing or eye movement with palpation, non-erythematous or warm  Abdominal: flat, nontender, bowel sounds normal Skin: warm and dry Neurological: occasionally opens eyes to voice but no other meaningful responses, does not follow any verbal commands  Assessment/Plan: Janet Gutierrez is a 75 y.o. female with hx of vascular dementia, prior stroke, dysphagia, and seizure disorder presenting with septic shock 2/2 pneumonia and possibly UTI. Had extended ICU stay and completed 7 days of Abx, transferred to our service on 1/26.   Principal Problem:   Severe sepsis (HCC) Active Problems:   Alzheimer's disease (HCC)   Altered mental status   Seizures (HCC)   COVID-19 virus infection   Pneumonia   Dysphagia   Pressure injury of skin   Protein-calorie malnutrition, severe  Septic shock 2/2 pneumonia vs UTI Acute hypoxic respiratory failure - resolved Presented in septic shock, CXR with pneumonia and urine culture grew strep agalactiae and staph simulans. Treated with 7 days Zosyn. Remained hypotensive after ICU  transfer, now improving. -stop florinef  -Decrease solu-cortef to 50mg  to daily  -cont. Midodrine 5mg  tid, consider continuing taper tomorrow  Bradycardia -improving Due to prior septic shock as above vs midodrine use.  Weaning midodrine as tolerated, blood pressure has been better recently.   - continue cardiac monitor - cont. Midodrine 5 mg tid today   Acute on chronic encephalopathy Vascular dementia Goals of care Enteric Nutrition Per son, she is "not 100%" but not far from her baseline, has history of vascular dementia. At baseline, she is unable to communicate or understand most words, but does respond to tactile and other cues that they have developed over the years. She has been in a similar state since 2013. She has a cortrak, and SLP recommends NPO unless she is transitioned to comfort feeds as she has extremely limited po intake and requires manual stimulation to swallow. The son is hopeful to bring her home and continue trying to feed her but at this time wants to continue full scope of care.  - palliative care consulted, appreciate assistance - will continue goals of care discussion - will discuss with [facility] - continue feeds through Cortrak per nutrition: osmolite 50cc/hr, prosource 87ml BID, and free water flushes - SLP following, continue NPO for now - CMP daily  Acute on chronic anemia Baseline is around 11, she has been stable between 9-10 this admission. Ferritin 250, iron 60, TIBC 291. MVC is elevated but B12 is not low. - trend CBC  RUE swelling - RUE doppler   Right Knee Effusion No  sign of infection or tenderness. Will hold off on arthrocentesis at this time.   Diet:  NPO, tube feeds IVF:  Normal saline VTE:  SCDs Prior to Admission Living Arrangement:  home Anticipated Discharge Location:  Home per family preference Barriers to Discharge:  Hypotension, nutritional needs Dispo: Anticipated discharge pending medical workup.   Claritza July A,  DO 10/29/2020, 10:52 AM Pager: [phone] After 5pm on weekdays and 1pm on weekends: On Call pager [phone]

## 2020-10-30 ENCOUNTER — Inpatient Hospital Stay (HOSPITAL_COMMUNITY): Payer: Medicare (Managed Care)

## 2020-10-30 DIAGNOSIS — M7989 Other specified soft tissue disorders: Secondary | ICD-10-CM | POA: Diagnosis not present

## 2020-10-30 DIAGNOSIS — T17800A Unspecified foreign body in other parts of respiratory tract causing asphyxiation, initial encounter: Secondary | ICD-10-CM

## 2020-10-30 DIAGNOSIS — T17800S Unspecified foreign body in other parts of respiratory tract causing asphyxiation, sequela: Secondary | ICD-10-CM

## 2020-10-30 DIAGNOSIS — E43 Unspecified severe protein-calorie malnutrition: Secondary | ICD-10-CM

## 2020-10-30 LAB — CBC
HCT: 28.5 % — ABNORMAL LOW (ref 36.0–46.0)
Hemoglobin: 9 g/dL — ABNORMAL LOW (ref 12.0–15.0)
MCH: 33.2 pg (ref 26.0–34.0)
MCHC: 31.6 g/dL (ref 30.0–36.0)
MCV: 105.2 fL — ABNORMAL HIGH (ref 80.0–100.0)
Platelets: 366 10*3/uL (ref 150–400)
RBC: 2.71 MIL/uL — ABNORMAL LOW (ref 3.87–5.11)
RDW: 17.8 % — ABNORMAL HIGH (ref 11.5–15.5)
WBC: 5.3 10*3/uL (ref 4.0–10.5)
nRBC: 0.4 % — ABNORMAL HIGH (ref 0.0–0.2)

## 2020-10-30 LAB — BASIC METABOLIC PANEL
Anion gap: 11 (ref 5–15)
BUN: 21 mg/dL (ref 8–23)
CO2: 26 mmol/L (ref 22–32)
Calcium: 7.9 mg/dL — ABNORMAL LOW (ref 8.9–10.3)
Chloride: 105 mmol/L (ref 98–111)
Creatinine, Ser: 0.46 mg/dL (ref 0.44–1.00)
GFR, Estimated: >60 mL/min (ref >60)
Glucose, Bld: 147 mg/dL — ABNORMAL HIGH (ref 70–99)
Potassium: 3.9 mmol/L (ref 3.5–5.1)
Sodium: 142 mmol/L (ref 135–145)

## 2020-10-30 LAB — GLUCOSE, CAPILLARY
Glucose-Capillary: 106 mg/dL — ABNORMAL HIGH (ref 70–99)
Glucose-Capillary: 107 mg/dL — ABNORMAL HIGH (ref 70–99)
Glucose-Capillary: 120 mg/dL — ABNORMAL HIGH (ref 70–99)
Glucose-Capillary: 125 mg/dL — ABNORMAL HIGH (ref 70–99)
Glucose-Capillary: 133 mg/dL — ABNORMAL HIGH (ref 70–99)
Glucose-Capillary: 138 mg/dL — ABNORMAL HIGH (ref 70–99)
Glucose-Capillary: 146 mg/dL — ABNORMAL HIGH (ref 70–99)

## 2020-10-30 LAB — FOLATE: Folate: 10.8 ng/mL (ref >5.9)

## 2020-10-30 NOTE — Progress Notes (Signed)
Nutrition Follow-up  DOCUMENTATION CODES:   Severe malnutrition in context of chronic illness  INTERVENTION:   Continue TF via cortrak:   -Osmolite 1.5 @ 50 ml/hr -45 ml Prosource TF BID -100 ml free water every 4 hours  Complete regimen provides 1880 kcals, 97 gram protein, and 914 ml free water (1514 ml with free water flushes).   -If pt continues to require TF due to dysphagia, may need to consider permanent feeding access (ex PEG)  NUTRITION DIAGNOSIS:   Severe Malnutrition related to chronic illness (dementia) as evidenced by severe fat depletion,severe muscle depletion.  Ongoing  GOAL:   Patient will meet greater than or equal to 90% of their needs  Met with TF  MONITOR:   Diet advancement,TF tolerance,Weight trends,Supplement acceptance,PO intake,Labs,I & O's  REASON FOR ASSESSMENT:   Consult Assessment of nutrition requirement/status  ASSESSMENT:   Patient with PMH significant for CVA with residual R hemiparesis, seizure disorder, vascular dementia, and known COVID positive (09/26/21). Presents this admission with PNA and possible UTI.  1/18- gastric Cortrak placed  Reviewed I/O's: -1 L x 24 hours and +3.2 L since 10/16/20  UOP: 1 L x 24 hours  Pt lying in bed at time of visit. She did not respond to voice or touch. No family present at time of visit.   Per SLP notes, pt still not ready for PO intake. They are recommending dysphagia 1 diet with thin liquids for comfort pt pt son willing to accept aspiration risk.   Pt continues to rely on TF for nutrition. She is receiving Osmolite 1.5 @ 50 ml via cortrak tube with 45 ml Prosource TF BID, and 100 ml free water flush every 4 hours. Complete regimen provides 1880 kcals, 97 grams protein, and 914 ml (1514 ml) free water daily, meeting 100% of estimated kcal and protein needs.   Medications reviewed and include keppra and miralax.   No results found for: HGBA1C PTA DM medications are .   Labs reviewed:  CBGS: 99-146 (inpatient orders for glycemic control are 0-6 units insulin aspart every 4 hours).   Diet Order:   Diet Order            Diet NPO time specified  Diet effective now                 EDUCATION NEEDS:   Not appropriate for education at this time  Skin:  Skin Assessment: Skin Integrity Issues: Skin Integrity Issues:: DTI,Stage I DTI: sacrum Stage I: R heel  Last BM:  10/28/20  Height:   Ht Readings from Last 1 Encounters:  10/14/20 5' 4"  (1.626 m)    Weight:   Wt Readings from Last 1 Encounters:  10/30/20 58.1 kg   BMI:  Body mass index is 21.99 kg/m.  Estimated Nutritional Needs:   Kcal:  1600-1900 kcal  Protein:  85-100 grams  Fluid:  >/= 1.6 L/day    Loistine Chance, RD, LDN, [place] Registered Dietitian II Certified Diabetes Care and Education Specialist Please refer to [facility] for RD and/or RD on-call/weekend/after hours pager

## 2020-10-30 NOTE — Progress Notes (Signed)
N: Does not respond to commands, non-purposeful movement of all extremities except flaccid RUE. Afebrile, PERRLA. Withdraws from pain.   R: RA, clear lungs  CV: SB 50s, BP WDL with scheduled midrodine.   GI: NGT bridled in place with tube feeds infusing at goal with 100 q4h flushes. BG checks not requiring insulin coverage. Positive bowel tones.   GU: purewick in place, minimal UOP but bed pad wet with unmeasured void.   No new skin issues, reddened sacrum noted beneath foam dressing.   No family at bedside.   Nomie Buchberger RN

## 2020-10-30 NOTE — Progress Notes (Signed)
   10/30/20 1952  Assess: MEWS Score  Temp 98.4 F (36.9 C)  BP (!) 116/56  Pulse Rate 62  ECG Heart Rate (!) 56  Resp 15  Level of Consciousness Alert  SpO2 97 %  O2 Device Room Air  Patient Activity (if Appropriate) In bed  Assess: if the MEWS score is Yellow or Red  Were vital signs taken at a resting state? Yes  Focused Assessment No change from prior assessment  Early Detection of Sepsis Score *See Row Information* Low  MEWS guidelines implemented *See Row Information* No, previously yellow, continue vital signs every 4 hours  Treat  MEWS Interventions Other (Comment) (continue monitoring)  Pain Scale Faces  Faces Pain Scale 0  Take Vital Signs  Increase Vital Sign Frequency  Yellow: Q 2hr X 2 then Q 4hr X 2, if remains yellow, continue Q 4hrs  Escalate  MEWS: Escalate Yellow: discuss with charge nurse/RN and consider discussing with provider and RRT  Notify: Charge Nurse/RN  Name of Charge Nurse/RN Notified [place],RN  Date Charge Nurse/RN Notified 10/30/20  Time Charge Nurse/RN Notified 2000  Notify: Provider  Provider Name/Title N/A    1915: Received patient on Yellow MEWS

## 2020-10-30 NOTE — Progress Notes (Signed)
Right upper extremity venous duplex has been completed. Preliminary results can be found in CV Proc through chart review.   10/30/20 12:23 PM Olen Cordial RVT

## 2020-10-30 NOTE — Progress Notes (Signed)
Palliative:  Chart review completed.  Conference with medical team yesterday related to patient condition, needs, goals of care.  Palliative medicine team shadowing for declines.  Plan: At this point, full scope/full code.  Active with [facility] for at home care.  Family's desires for Janet Gutierrez to return to their home, continuing with [facility] program.  No charge Janet Carmel, NP Palliative medicine team Team time [phone] Greater than 50% of this time was spent counseling and coordinating care related to the above assessment and plan.

## 2020-10-30 NOTE — Progress Notes (Signed)
HD#17 Subjective:  Overnight Events: HR remains in the 50s  This AM, patient was somnolent during encounter. She grimaced to painful stimuli but did not follow commands. sBP in the 80s during encounter.   Objective:  Vital signs in last 24 hours: Vitals:   10/30/20 0433 10/30/20 0500 10/30/20 0600 10/30/20 0641  BP:   (!) 89/42 106/62  Pulse:    (!) 54  Resp:  13 12 14   Temp:    98.6 F (37 C)  TempSrc:      SpO2:  99% 100%   Weight: 58.1 kg     Height:       Supplemental O2: Room Air SpO2: 99 % O2 Flow Rate (L/min): 1 L/min FiO2 (%): 100 %   Physical Exam:   General: Chronically ill-appearing woman laying in bed. No acute distress. Head: Normocephalic. Atraumatic. CV: Bradycardia. Regular rhythm. No m/r/g. No LE edema Pulmonary: Lungs CTAB. Normal effort. No wheezing or rales. Abdominal: Soft, nontender, nondistended. Normal bowel sounds. Extremities: Palpable dorsalis pedis and radialis pulses. Normal ROM. Skin: Warm and dry. No obvious rash or lesions. Neuro: Somnolent on exam. Unable to assess orientation  Filed Weights   10/29/20 0000 10/29/20 0500 10/30/20 0433  Weight: 62 kg 62 kg 58.1 kg     Intake/Output Summary (Last 24 hours) at 10/30/2020 0635 Last data filed at 10/30/2020 0000 Gross per 24 hour  Intake 0 ml  Output 1000 ml  Net -1000 ml   Net IO Since Admission: 6,730.74 mL [10/30/20 0635]  Recent Labs    10/29/20 2341 10/30/20 0056 10/30/20 0355  GLUCAP 121* 138* 146*     Pertinent Labs: CBC Latest Ref Rng & Units 10/30/2020 10/29/2020 10/28/2020  WBC 4.0 - 10.5 K/uL 5.3 6.0 8.4  Hemoglobin 12.0 - 15.0 g/dL 9.0(L) 9.0(L) 8.8(L)  Hematocrit 36.0 - 46.0 % 28.5(L) 27.3(L) 28.2(L)  Platelets 150 - 400 K/uL 366 464(H) 503(H)    CMP Latest Ref Rng & Units 10/30/2020 10/29/2020 10/28/2020  Glucose 70 - 99 mg/dL 10/30/2020) 409(B) 353(G)  BUN 8 - 23 mg/dL 21 21 21   Creatinine 0.44 - 1.00 mg/dL 992(E 2.68  Sodium 135 - 145 mmol/L 142 143 142   Potassium 3.5 - 5.1 mmol/L 3.9 3.8 3.8  Chloride 98 - 111 mmol/L 105 106 106  CO2 22 - 32 mmol/L 26 28 28   Calcium 8.9 - 10.3 mg/dL 7.9(L) 8.0(L) 7.9(L)  Total Protein 6.5 - 8.1 g/dL - - -  Total Bilirubin 0.3 - 1.2 mg/dL - - -  Alkaline Phos 38 - 126 U/L - - -  AST 15 - 41 U/L - - -  ALT 0 - 44 U/L - - -    Imaging: No results found.  Assessment/Plan:   Principal Problem:   Severe sepsis (HCC) Active Problems:   Alzheimer's disease (HCC)   Altered mental status   Seizures (HCC)   COVID-19 virus infection   Pneumonia   Dysphagia   Pressure injury of skin   Protein-calorie malnutrition, severe   Patient Summary: Janet Gutierrez is a 75 y.o.  female with hx of vascular dementia, prior stroke, dysphagia, and seizure disorderpresenting with septic shock 2/2 pneumonia and possibly UTI. Had extended ICU stay and completed 7 days of Zosyn, transferred to our service on 1/26. Pending SNF vs Home with [facility]  End-staged Vascular dementia Failure to thrive Goals of care Patient failed swallow screen by SLP yesterday so remains NPO. Patient continues to be deconditioned and  somnolent on exam. She has lost 6 lbs in the last 2 weeks. Currently with Cortrak but this is a temporary solution. Another family meeting needed to discuss definite plans for deposition as patient is at high risk of aspiration without tube feeds.  --Palliative care following, appreciate assistance --Continue tube feeds through Cortrak for now. --SLP following, recommend NPO. --CMP  Septic shock 2/2 pneumonia vs UTI Acute hypoxic respiratory failure - resolved Overall, improved from respiratory standpoint. BP still soft with sBP in the 90s-100s. Continue to watch closely.  --Discontinue florinet today --Continue Solu-Cortef 50 mg daily for now --Midodrine 5 mg TID  Bradycardia, improving Stable with HR in the 50s. Asymptomatic. Plan to wean midodrine as tolerated by BP.  --Continue tele --Continue  Midodrine 5 mg TID   Acute on chronic anemia Baseline is around 11. Ferritin 250, iron 60, TIBC 291. Elevated MCV but high B12 and normal folate. Hgb currently stable at 9.  --Daily CBC  RUE swelling RUE doppler negative for DVT. Will continue to monitor.  Right Knee Effusion No signs of infection. No arthrocentesis needed at this time.   Diet: NPO, Tube feeds IVF: IVNS VTE: enoxaparin (LOVENOX) injection 40 mg Start: 10/13/20 2100 Code: Full PT/OT: SNF for Subacute PT ID:  Anti-infectives (From admission, onward)   Start     Dose/Rate Route Frequency Ordered Stop   10/14/20 1000  remdesivir 100 mg in sodium chloride 0.9 % 100 mL IVPB  Status:  Discontinued       "Followed by" Linked Group Details   100 mg 200 mL/hr over 30 Minutes Intravenous Daily 10/13/20 1501 10/13/20 1557   10/14/20 1000  vancomycin (VANCOCIN) IVPB 1000 mg/200 mL premix  Status:  Discontinued        1,000 mg 200 mL/hr over 60 Minutes Intravenous Every 24 hours 10/13/20 2108 10/16/20 0751   10/14/20 0800  piperacillin-tazobactam (ZOSYN) IVPB 3.375 g        3.375 g 12.5 mL/hr over 240 Minutes Intravenous Every 8 hours 10/14/20 0741 10/21/20 0150   10/14/20 0200  ceFEPIme (MAXIPIME) 2 g in sodium chloride 0.9 % 100 mL IVPB  Status:  Discontinued        2 g 200 mL/hr over 30 Minutes Intravenous Every 12 hours 10/13/20 1700 10/14/20 0739   10/13/20 1515  remdesivir 200 mg in sodium chloride 0.9% 250 mL IVPB  Status:  Discontinued       "Followed by" Linked Group Details   200 mg 580 mL/hr over 30 Minutes Intravenous Once 10/13/20 1501 10/13/20 1557   10/13/20 1345  vancomycin (VANCOCIN) IVPB 1000 mg/200 mL premix  Status:  Discontinued        1,000 mg 200 mL/hr over 60 Minutes Intravenous  Once 10/13/20 1330 10/13/20 1342   10/13/20 1345  ceFEPIme (MAXIPIME) 2 g in sodium chloride 0.9 % 100 mL IVPB        2 g 200 mL/hr over 30 Minutes Intravenous  Once 10/13/20 1330 10/13/20 1502   10/13/20 1345   vancomycin (VANCOREADY) IVPB 1250 mg/250 mL        1,250 mg 166.7 mL/hr over 90 Minutes Intravenous  Once 10/13/20 1342 10/13/20 1621       Anticipated discharge to Home due to son preference in 1-2 days pending ability to swallow.  Steffanie Rainwater, MD 10/30/2020, 6:35 AM Pager: [phone] [facility]  Please contact the on call pager after 5 pm and on weekends at [phone].

## 2020-10-31 LAB — CBC
HCT: 27.9 % — ABNORMAL LOW (ref 36.0–46.0)
Hemoglobin: 9 g/dL — ABNORMAL LOW (ref 12.0–15.0)
MCH: 33.5 pg (ref 26.0–34.0)
MCHC: 32.3 g/dL (ref 30.0–36.0)
MCV: 103.7 fL — ABNORMAL HIGH (ref 80.0–100.0)
Platelets: 391 10*3/uL (ref 150–400)
RBC: 2.69 MIL/uL — ABNORMAL LOW (ref 3.87–5.11)
RDW: 17.7 % — ABNORMAL HIGH (ref 11.5–15.5)
WBC: 4.6 10*3/uL (ref 4.0–10.5)
nRBC: 0.6 % — ABNORMAL HIGH (ref 0.0–0.2)

## 2020-10-31 LAB — BASIC METABOLIC PANEL
Anion gap: 9 (ref 5–15)
BUN: 20 mg/dL (ref 8–23)
CO2: 30 mmol/L (ref 22–32)
Calcium: 8 mg/dL — ABNORMAL LOW (ref 8.9–10.3)
Chloride: 106 mmol/L (ref 98–111)
Creatinine, Ser: 0.49 mg/dL (ref 0.44–1.00)
GFR, Estimated: >60 mL/min (ref >60)
Glucose, Bld: 178 mg/dL — ABNORMAL HIGH (ref 70–99)
Potassium: 3.6 mmol/L (ref 3.5–5.1)
Sodium: 145 mmol/L (ref 135–145)

## 2020-10-31 LAB — GLUCOSE, CAPILLARY
Glucose-Capillary: 164 mg/dL — ABNORMAL HIGH (ref 70–99)
Glucose-Capillary: 100 mg/dL — ABNORMAL HIGH (ref 70–99)
Glucose-Capillary: 103 mg/dL — ABNORMAL HIGH (ref 70–99)
Glucose-Capillary: 154 mg/dL — ABNORMAL HIGH (ref 70–99)
Glucose-Capillary: 112 mg/dL — ABNORMAL HIGH (ref 70–99)

## 2020-10-31 MED ORDER — HYDROCORTISONE NA SUCCINATE PF 100 MG IJ SOLR
50.0000 mg | Freq: Every day | INTRAMUSCULAR | Status: DC
  Administered 2020-11-01 – 2020-11-02 (×2): 50 mg via INTRAVENOUS
  Filled 2020-10-31 (×2): qty 2

## 2020-10-31 MED ORDER — RESOURCE THICKENUP CLEAR PO POWD
ORAL | Status: DC | PRN
  Filled 2020-10-31: qty 125

## 2020-10-31 NOTE — Care Management Important Message (Signed)
Important Message  Patient Details  Name: Janet Gutierrez MRN: [id number] Date of Birth: 03-May-1946   Medicare Important Message Given:  Yes gave letter to pt. RN.     Sloan Leiter Smith 10/31/2020, 2:01 PM

## 2020-10-31 NOTE — Progress Notes (Signed)
  Speech Language Pathology Treatment: Dysphagia  Patient Details Name: Janet Gutierrez MRN: [id number] DOB: [birth year] Today's Date: 10/31/2020 Time: 1696-7893 SLP Time Calculation (min) (ACUTE ONLY): 19 min  Assessment / Plan / Recommendation Clinical Impression  Pt seen right after being cleaned up by RN and NT. Pt opening eyes to verbal and tactile stim, still difficult to position, but SLP attempted to place head upright as much as possible. SLP frequently wiped pts mouth to stimulate oral response, and when straw or spoon was placed during oral movement, pt had an automatic response. Prolonged lingual thrusting, rolling tongue in mouth and pooling on left and spilling anteriorly still observed, but with decreased frequency. In most trials, pt transited the bolus and swallowed consistently, a big improvement from prior sessions. Pt consumed several oz of thin water, vanilla ensure, nectar thick water and vanilla pudding. There was intermittent coughing and throat clearing with thin liquids. Pt appeared to tolerate nectar textures a little better with less coughing. Given signs of improvement, recommend a dys 1 (puree) diet and nectar thick liquids. Discussed techniques with NT and RN and posted a sign with recommendations. Will attempt a trial of diet while admitted to see if pt can be liberated from alternate means of nutrition.    HPI HPI: 75 year old female w/ hx of vascular dementia, seizures, known COVID positive sent from assisted living with AMS, difficulty swallowing, hypoxia, and fever.  Found to have pneumonia and possible UTI, ongoing hypotension in ER despite IVFs.  Hx CVA at age 60. Now with septic shock secondary to pna, likely aspiration; encephalopathy.      SLP Plan  Continue with current plan of care       Recommendations  Diet recommendations: Dysphagia 1 (puree);Nectar-thick liquid Liquids provided via: Straw Medication Administration: Crushed with puree                 Oral Care Recommendations: Oral care QID Follow up Recommendations: Home health SLP;24 hour supervision/assistance SLP Visit Diagnosis: Dysphagia, unspecified (R13.10) Plan: Continue with current plan of care       GO               Harlon Ditty, MA CCC-SLP  Acute Rehabilitation Services Pager [phone] Office [phone]  Claudine Mouton 10/31/2020, 2:13 PM

## 2020-10-31 NOTE — Progress Notes (Signed)
HD#18 Subjective:  Overnight Events: Still hypotensive and bradycardic  Patient was assessed at the bedside laying comfortably in bed. Patient continues to be nonverbal but opens eyes and withdrawals to painful stimuli. Patient having tongue fasciculations during encounter.   Objective:  Vital signs in last 24 hours: Vitals:   10/30/20 2200 10/30/20 2327 10/31/20 0000 10/31/20 0409  BP: 121/64 (!) 117/50 (!) 107/45   Pulse:      Resp:  15    Temp:  98.5 F (36.9 C)    TempSrc:  Axillary    SpO2:  97%    Weight:    62.6 kg  Height:       Supplemental O2: Room Air SpO2: 97 % O2 Flow Rate (L/min): 1 L/min FiO2 (%): 100 %   Physical Exam:   General: Chronically ill-appearing woman laying in bed. No acute distress HEENT: MMM. Occasional drooling. Tongue fasciculations.    CV: Bradycardic. Regular rhythm. No m/r/g. No LE edema. Pulm: Lungs CTAB. No wheezing or rales. Abdominal: Soft. NT/ND. Normal BS. Extremities: Palpable dp and radialis pulses. Mildly contracted upper extremities. Skin: Warm and dry. No obvious rash Neuro: Exam unchanged. Somnolent. Does not follow commands or respond to voice. Opens eyes spontaneously and withdraws to painful stimuli  Filed Weights   10/29/20 0500 10/30/20 0433 10/31/20 0409  Weight: 62 kg 58.1 kg 62.6 kg     Intake/Output Summary (Last 24 hours) at 10/31/2020 0635 Last data filed at 10/31/2020 0615 Gross per 24 hour  Intake 1170 ml  Output 1210 ml  Net -40 ml   Net IO Since Admission: 6,690.74 mL [10/31/20 0635]  Recent Labs    10/30/20 1953 10/30/20 2323 10/31/20 0407  GLUCAP 107* 125* 164*     Pertinent Labs: CBC Latest Ref Rng & Units 10/31/2020 10/30/2020 10/29/2020  WBC 4.0 - 10.5 K/uL 4.6 5.3 6.0  Hemoglobin 12.0 - 15.0 g/dL 9.0(L) 9.0(L) 9.0(L)  Hematocrit 36.0 - 46.0 % 27.9(L) 28.5(L) 27.3(L)  Platelets 150 - 400 K/uL 391 366 464(H)    CMP Latest Ref Rng & Units 10/31/2020 10/30/2020 10/29/2020  Glucose 70 - 99 mg/dL  119(J) 478(G) 956(O)  BUN 8 - 23 mg/dL 20 21 21   Creatinine 0.44 - 1.00 mg/dL 1.30 8.65  Sodium 135 - 145 mmol/L 145 142 143  Potassium 3.5 - 5.1 mmol/L 3.6 3.9 3.8  Chloride 98 - 111 mmol/L 106 105 106  CO2 22 - 32 mmol/L 30 26 28   Calcium 8.9 - 10.3 mg/dL 8.0(L) 7.9(L) 8.0(L)  Total Protein 6.5 - 8.1 g/dL - - -  Total Bilirubin 0.3 - 1.2 mg/dL - - -  Alkaline Phos 38 - 126 U/L - - -  AST 15 - 41 U/L - - -  ALT 0 - 44 U/L - - -    Imaging: VAS 7.84 UPPER EXTREMITY VENOUS DUPLEX  Result Date: 10/30/2020 UPPER VENOUS STUDY  Indications: Swelling Risk Factors: None identified. Limitations: Body habitus, poor ultrasound/tissue interface and patient positioning, patient immobility. Comparison Study: No prior studies. Performing Technologist: [place] RVT  Examination Guidelines: A complete evaluation includes B-mode imaging, spectral Doppler, color Doppler, and power Doppler as needed of all accessible portions of each vessel. Bilateral testing is considered an integral part of a complete examination. Limited examinations for reoccurring indications may be performed as noted.  Right Findings: +----------+------------+---------+-----------+----------+-------+ RIGHT     CompressiblePhasicitySpontaneousPropertiesSummary +----------+------------+---------+-----------+----------+-------+ IJV           Full  Yes       Yes                      +----------+------------+---------+-----------+----------+-------+ Subclavian    Full       Yes       Yes                      +----------+------------+---------+-----------+----------+-------+ Axillary      Full       Yes       Yes                      +----------+------------+---------+-----------+----------+-------+ Brachial      Full       Yes       Yes                      +----------+------------+---------+-----------+----------+-------+ Radial        Full                                           +----------+------------+---------+-----------+----------+-------+ Ulnar         Full                                          +----------+------------+---------+-----------+----------+-------+ Cephalic      Full                                          +----------+------------+---------+-----------+----------+-------+ Basilic       Full                                          +----------+------------+---------+-----------+----------+-------+  Left Findings: +----------+------------+---------+-----------+----------+-------+ LEFT      CompressiblePhasicitySpontaneousPropertiesSummary +----------+------------+---------+-----------+----------+-------+ Subclavian    Full       Yes       Yes                      +----------+------------+---------+-----------+----------+-------+  Summary:  Right: No evidence of deep vein thrombosis in the upper extremity. No evidence of superficial vein thrombosis in the upper extremity.  Left: No evidence of thrombosis in the subclavian.  *See table(s) above for measurements and observations.  Diagnosing physician: Gretta Began MD Electronically signed by Gretta Began MD on 10/30/2020 at 4:55:40 PM.    Final     Assessment/Plan:   Principal Problem:   Severe sepsis [facility]) Active Problems:   Alzheimer's disease (HCC)   Altered mental status   Seizures (HCC)   COVID-19 virus infection   Pneumonia   Dysphagia   Pressure injury of skin   Protein-calorie malnutrition, severe   Aspiration into lower respiratory tract   Patient Summary: Janet Gutierrez is a 75 y.o.  female with hx of vascular dementia, prior stroke, dysphagia, and seizure disorderpresenting with septic shock 2/2 pneumonia and possibly UTI. Had extended ICU stay and completed 7 days of Zosyn, transferred to our service on 1/26. Pending SNF vs Home with [facility]  End-staged Vascular dementia Failure to thrive Goals of care Patient has  not made any significant progress. She  continues to decline. She had increased tongue fasciculations today. Neuro exam unchanged. Still unable to swallow which makes prognosis very poor.  --Re-consulting palliative care today --Speak with son about prognosis and recommendations.  --Continue tube feeds via Cortrak for now, but this remains a temporary solution --SLP following --Pending repeat GOC conversation  Septic shock 2/2 pneumonia vs UTI, improved Acute hypoxic respiratory failure -resolved Respiratory status improved. On room air. Hypotensive overnight but BP improved to 127/65. Will continue to monitor.  --Continue Solu-Cortef 50 mg daily --Continue 5 mg TID  Bradycardia, improving Stable with HR in the 50s.  --Continue tele monitoring --Continue Midodrine 5 mg TID as above   Acute on chronic anemia Baseline is around 11. Hgb stable at 9. No signs of active bleed.  --Daily CBC  RUE swelling RUE doppler negative for DVT. No changes today.   Right Knee Effusion No signs of infection. No arthrocentesis needed at this time.   Diet: NPO, Tube feeds IVF: IVNS VTE: enoxaparin (LOVENOX) injection 40 mg Start: 10/13/20 2100 Code: Full PT/OT: SNF for Subacute PT ID:  Anti-infectives (From admission, onward)   Start     Dose/Rate Route Frequency Ordered Stop   10/14/20 1000  remdesivir 100 mg in sodium chloride 0.9 % 100 mL IVPB  Status:  Discontinued       "Followed by" Linked Group Details   100 mg 200 mL/hr over 30 Minutes Intravenous Daily 10/13/20 1501 10/13/20 1557   10/14/20 1000  vancomycin (VANCOCIN) IVPB 1000 mg/200 mL premix  Status:  Discontinued        1,000 mg 200 mL/hr over 60 Minutes Intravenous Every 24 hours 10/13/20 2108 10/16/20 0751   10/14/20 0800  piperacillin-tazobactam (ZOSYN) IVPB 3.375 g        3.375 g 12.5 mL/hr over 240 Minutes Intravenous Every 8 hours 10/14/20 0741 10/21/20 0150   10/14/20 0200  ceFEPIme (MAXIPIME) 2 g in sodium chloride 0.9 % 100 mL IVPB  Status:  Discontinued         2 g 200 mL/hr over 30 Minutes Intravenous Every 12 hours 10/13/20 1700 10/14/20 0739   10/13/20 1515  remdesivir 200 mg in sodium chloride 0.9% 250 mL IVPB  Status:  Discontinued       "Followed by" Linked Group Details   200 mg 580 mL/hr over 30 Minutes Intravenous Once 10/13/20 1501 10/13/20 1557   10/13/20 1345  vancomycin (VANCOCIN) IVPB 1000 mg/200 mL premix  Status:  Discontinued        1,000 mg 200 mL/hr over 60 Minutes Intravenous  Once 10/13/20 1330 10/13/20 1342   10/13/20 1345  ceFEPIme (MAXIPIME) 2 g in sodium chloride 0.9 % 100 mL IVPB        2 g 200 mL/hr over 30 Minutes Intravenous  Once 10/13/20 1330 10/13/20 1502   10/13/20 1345  vancomycin (VANCOREADY) IVPB 1250 mg/250 mL        1,250 mg 166.7 mL/hr over 90 Minutes Intravenous  Once 10/13/20 1342 10/13/20 1621       Anticipated discharge to Home due to son preference in 1-2 days pending ability to swallow.  Steffanie Rainwater, MD 10/31/2020, 6:35 AM Pager: [phone] [facility]  Please contact the on call pager after 5 pm and on weekends at [phone].

## 2020-11-01 DIAGNOSIS — T17800A Unspecified foreign body in other parts of respiratory tract causing asphyxiation, initial encounter: Secondary | ICD-10-CM

## 2020-11-01 DIAGNOSIS — Z7189 Other specified counseling: Secondary | ICD-10-CM

## 2020-11-01 LAB — BASIC METABOLIC PANEL
Anion gap: 10 (ref 5–15)
BUN: 18 mg/dL (ref 8–23)
CO2: 30 mmol/L (ref 22–32)
Calcium: 8 mg/dL — ABNORMAL LOW (ref 8.9–10.3)
Chloride: 107 mmol/L (ref 98–111)
Creatinine, Ser: 0.49 mg/dL (ref 0.44–1.00)
GFR, Estimated: >60 mL/min (ref >60)
Glucose, Bld: 81 mg/dL (ref 70–99)
Potassium: 3.6 mmol/L (ref 3.5–5.1)
Sodium: 147 mmol/L — ABNORMAL HIGH (ref 135–145)

## 2020-11-01 LAB — CBC
HCT: 28.7 % — ABNORMAL LOW (ref 36.0–46.0)
Hemoglobin: 8.9 g/dL — ABNORMAL LOW (ref 12.0–15.0)
MCH: 32.5 pg (ref 26.0–34.0)
MCHC: 31 g/dL (ref 30.0–36.0)
MCV: 104.7 fL — ABNORMAL HIGH (ref 80.0–100.0)
Platelets: 343 10*3/uL (ref 150–400)
RBC: 2.74 MIL/uL — ABNORMAL LOW (ref 3.87–5.11)
RDW: 17.7 % — ABNORMAL HIGH (ref 11.5–15.5)
WBC: 4.8 10*3/uL (ref 4.0–10.5)
nRBC: 0.4 % — ABNORMAL HIGH (ref 0.0–0.2)

## 2020-11-01 LAB — GLUCOSE, CAPILLARY
Glucose-Capillary: 106 mg/dL — ABNORMAL HIGH (ref 70–99)
Glucose-Capillary: 107 mg/dL — ABNORMAL HIGH (ref 70–99)
Glucose-Capillary: 112 mg/dL — ABNORMAL HIGH (ref 70–99)
Glucose-Capillary: 125 mg/dL — ABNORMAL HIGH (ref 70–99)
Glucose-Capillary: 193 mg/dL — ABNORMAL HIGH (ref 70–99)
Glucose-Capillary: 72 mg/dL (ref 70–99)
Glucose-Capillary: 95 mg/dL (ref 70–99)

## 2020-11-01 MED ORDER — FREE WATER
100.0000 mL | Status: DC
  Administered 2020-11-01 – 2020-11-02 (×7): 100 mL

## 2020-11-01 MED ORDER — LACTATED RINGERS IV BOLUS
500.0000 mL | Freq: Once | INTRAVENOUS | Status: AC
  Administered 2020-11-01: 500 mL via INTRAVENOUS

## 2020-11-01 NOTE — Progress Notes (Signed)
Palliative: Late entry, note reflects conference with resident on 10/31/2020.  Text page from resident requesting phone conference related to goals of care discussions with son/HC POA, Quinton Jones.  We talked about Amora's acute and chronic health issues.    Mrs. Mccarey is active with [facility], who provides in-home care/support for people who would qualify for long-term care.  Previous discussions encouraged for resident/attending to reach out to [facility] NP, numbers provided.  Resident shares that they have spoken with the [facility] NP.  [facility] NP was to reach out to [facility] for goals of care discussions.  [facility] program participants standardly have goals of care/CODE STATUS discussion every 6 months.  Quinton endorse that he has held these discussions with [facility]'s [facility] team multiple time in th past.   We talked about by mouth feedings for Mrs. Simonet.  Quinton shares tips and techniques for feeding his mother.  Although she is declining, Quinton still endorses pleasure from foods and interaction with family.  He states that his preference is for her to have by mouth feeding, but would accept PEG tube if offered.  I shared that IR/GI consult could assist in appropriateness of offering/not offering PEG tube placement.  This NP had face-to-face meetings with Ernie Hew at Mrs. Storer's bedside on several occasions.   Fritzi Mandes has never wavered in his choices for full scope/full code.  We have talked about Mrs. Keshishyan's quality of life, her mobility, her spiritual and emotional wellbeing.  He shares that although he prefer that she take food by mouth, he is open to PEG tube if offered.  This NP discussed mortality with Quinton at his mother's bedside.  He would, at times, deflect when discussing these difficult subjects.  Plan: At this point full scope/full code.  Active with [facility] program for at home care/daycare.  Son/HC POA would accept PEG tube if offered, although he would prefer Mrs. Lankford to be fed by mouth if  at all possible.  Ultimate goal is to return home continuing with [facility].  Conference with palliative medicine team and medical director related to patient condition, needs, goals of care discussions.  No charge Lillia Carmel, NP Palliative medicine team Team phone [phone] Greater than 50% of this time was spent counseling and coordinating care related to the above assessment and plan.

## 2020-11-01 NOTE — Progress Notes (Signed)
Palliative Care Progress Note  Call received from IMTS resident requesting we speak with patient's son again. Please see additional documentation from Palliative NP Lillia Carmel and SLP from today. While this patient has very advanced dementia and late effects of stroke she is likely very close to her baseline. Her son is her primary caregiver and is devoted to her care and making sure that she has the best care possible. While we know her prognosis is very poor and that she has a neurodegenerative condition, her son believes that she has QOL and he believes that the choices he is making for aggressive treatment are best for her- his decisions are his way of "fighting" for her vs.allowing for a natural death to occur with comfort. He carefully hand feeds her several times a day and is devoted to keeping her alive despite her debilitated and non-communicative state.  Recommendations:  1. Do not offer or initiate medical interventions that would be medically ineffective. Acknowledge when death may be immanent and prepare the son the best way possible. Avoid pressuring on code status topic - recommend IMTS gently remind him that we are doing everything possible to HELP his mom, but if her heart were to unexpectedly stop and it was "her time" that we would not initiate CPR or put her on a life support machine because those things would not help her if she has died. Medically ineffective and futile interventions should not and do not have to be performed under any circumstances. Continue to reassure her son and update him on all the things we are doing to help his mom and care for her while also being realistic about our concerns for her prognosis.  2. She is at her baseline. Appreciate the thoughtful note by SLP. She will do much better in her home environment than in the hospital and most certainly better than in a SNF. She should have excellent support with [facility] and her son. Recommend immediate removal of  cortrak feeding tube which can further impair swallowing-continue to carefully hand feed- this will be done better at home too and is very time intensive.  3. Discontinue Tele and other continuous monitoring as this can worsen delirium states. Allow patient to have uninterrupted rest.  4. Partner with her [facility] team to continue anticipatory care planning and to support son in preparing for his mom's EOL and illness trajectory.  5. SNF discharge is not an appropriate discharge plan for this patient. This patient is clearly not able to meaningfully participate or tolerate "rehab".  Please see OT note that states she was unable to participate in any part of the evaluation. Also consider that she was in a SNF for short term respite care (her son and his wife took a vacation) when she got COVID infection and then ended up in the hospital- prior to this she has not had one single hospital admission or ED visit in the past 4 years.  Discharge home with [facility].  Anderson Malta, DO Palliative Medicine  Time: 35 min Greater than 50%  of this time was spent counseling and coordinating care related to the above assessment and plan.

## 2020-11-01 NOTE — Progress Notes (Addendum)
Pt BP 80's/30-40's; Notified MD; scheduled Midodrine given; Along with half liter LR bolus per MD order. Will recheck BP post bolus.    1853: Pt received second LR 500 mL bolus. VS documented in flowsheet. On call MD paged to update. Son remains at bedside. No new orders placed. Will continue to monitor.

## 2020-11-01 NOTE — Progress Notes (Addendum)
Nutrition Follow-up  DOCUMENTATION CODES:   Severe malnutrition in context of chronic illness  INTERVENTION:   Continue TF via cortrak:   -Osmolite 1.5 @ 50 ml/hr -45 ml Prosource TF BID -100 ml free water every 4 hours  Complete regimen provides 1880 kcals, 97 gram protein, and 914 ml free water (1514 ml with free water flushes).   -If pt continues to require TF due to dysphagia, may need to consider permanent feeding access (ex PEG)  -Feeding assistance with meals -Magic cup TID with meals, each supplement provides 290 kcal and 9 grams of protein  NUTRITION DIAGNOSIS:   Severe Malnutrition related to chronic illness (dementia) as evidenced by severe fat depletion,severe muscle depletion.  Ongoing  GOAL:   Patient will meet greater than or equal to 90% of their needs  Progressing   MONITOR:   Diet advancement,TF tolerance,Weight trends,Supplement acceptance,PO intake,Labs,I & O's  REASON FOR ASSESSMENT:   Consult Assessment of nutrition requirement/status  ASSESSMENT:   Patient with PMH significant for CVA with residual R hemiparesis, seizure disorder, vascular dementia, and known COVID positive (09/26/21). Presents this admission with PNA and possible UTI.  1/18- gastric Cortrak placed 2/2- s/p BSE- advanced to dysphagia 1 diet with nectar thick liquids  Reviewed I/O's: +2.6 L x 24 hours and +7 L since 10/18/20  UOP: 200 ml x 24 hours  Pt unavailable at time of visit.   Per SLP notes, pt was more alert yesterday and made improvements in swallow function.She was able to consume several bites and sips of purees and Ensure. No documented meal completions available to assess at this time.   Case discussed with MD. Palliative care consult has been re-ordered. MD also asked MD if there was a certain time frame that a cortrak tube could be used for; advised MD that there was no particular time frame for cortrak use, however, if pt continues to have minimal oral  intake and unable to sustain herself via PO route, permanent feeding access (ex PEG) would be recommended. MD plans to speak with son regarding possible PEG placement.   Pt continues to rely on TF for nutrition. She is receiving Osmolite 1.5 @ 50 ml via cortrak tube with 45 ml Prosource TF BID, and 100 ml free water flush every 4 hours. Complete regimen provides 1880 kcals, 97 grams protein, and 914 ml (1514 ml) free water daily, meeting 100% of estimated kcal and protein needs.   Medications reviewed and include keppra and miralax.   Labs reviewed: Na: 147, CBGS: 95-125 (inpatient orders for glycemic control are 0-6 units insulin aspart every 4 hours).   Diet Order:   Diet Order            DIET - DYS 1 Room service appropriate? Yes; Fluid consistency: Nectar Thick  Diet effective now                 EDUCATION NEEDS:   Not appropriate for education at this time  Skin:  Skin Assessment: Skin Integrity Issues: Skin Integrity Issues:: DTI,Stage I DTI: sacrum Stage I: R heel  Last BM:  11/01/20  Height:   Ht Readings from Last 1 Encounters:  10/14/20 5\' 4"  (1.626 m)    Weight:   Wt Readings from Last 1 Encounters:  10/31/20 62.6 kg   BMI:  Body mass index is 23.69 kg/m.  Estimated Nutritional Needs:   Kcal:  1600-1900 kcal  Protein:  85-100 grams  Fluid:  >/= 1.6 L/day    Janet Gutierrez  W, RD, LDN, [place] Registered Dietitian II Certified Diabetes Care and Education Specialist Please refer to [facility] for RD and/or RD on-call/weekend/after hours pager

## 2020-11-01 NOTE — Progress Notes (Signed)
  Speech Language Pathology Treatment: Dysphagia  Patient Details Name: Janet Gutierrez MRN: [id number] DOB: 05-14-1946 Today's Date: 11/01/2020 Time: 8366-2947 SLP Time Calculation (min) (ACUTE ONLY): 26 min  Assessment / Plan / Recommendation Clinical Impression  Pt again demonstrates function consistent with sons report of pts baseline function and consistent with SLP's treatment session yesterday. Pts eyes are open after speaking to her but she does not follow commands or verbalize, SLP repositioned her at midline with head supported with a pillow. Offered bites of pureed solids that were on her lunch tray (thick pureed sweet potato - consumed 100%, thick pureed meat - 50% , thin pureed carrots 50%, and a magic cup 100%). Pt demonstrates lingual thrusting and pooling of bolus in her left buccal cavity. SLP applied pressure to left cheek with a towel or wiped mouth with a towel if pt stopped moving the bolus, which triggered more manipulation and eventual successful posterior transit and swallow response.  If the bolus fell out of pts cheek, SLP replaced it with the spoon, posteriorly in the mouth. Pt also able to take straw sips of nectar thick liquids. There were no signs of aspiration throughout meal. It took about 25 minutes to feed pt, which is not an unusual amount of time for a pt with severe dementia. Though this method of feeding is not ideal and appears risky to staff, it is consistent with pts baseline function per her son's description, though perhaps even safer given that she was previously drinking thin liquids. Would recommend at this point a trial of stopping tube feedings so that pt can demonstrate consistent intake. It is not expected that pts ability to eat and drink would improve any more beyond this point. Discussed with RN and MD. Janet Gutierrez also discussed with son who was pleased that his mother was able to eat. Will f/u.    HPI HPI: 75 year old female w/ hx of vascular  dementia, seizures, known COVID positive sent from assisted living with AMS, difficulty swallowing, hypoxia, and fever.  Found to have pneumonia and possible UTI, ongoing hypotension in ER despite IVFs.  Hx CVA at age 65. Now with septic shock secondary to pna, likely aspiration; encephalopathy.      SLP Plan  Continue with current plan of care       Recommendations  Diet recommendations: Dysphagia 1 (puree);Nectar-thick liquid Liquids provided via: Straw Medication Administration: Crushed with puree Supervision: Staff to assist with self feeding                Oral Care Recommendations: Oral care QID Follow up Recommendations: Home health SLP;24 hour supervision/assistance SLP Visit Diagnosis: Dysphagia, unspecified (R13.10) Plan: Continue with current plan of care       GO               Harlon Ditty, MA CCC-SLP  Acute Rehabilitation Services Pager [phone] Office [phone]  Claudine Mouton 11/01/2020, 2:38 PM

## 2020-11-01 NOTE — Plan of Care (Signed)
  Problem: Skin Integrity: Goal: Risk for impaired skin integrity will decrease Outcome: Progressing   

## 2020-11-01 NOTE — Plan of Care (Signed)
  Problem: Safety: Goal: Ability to remain free from injury will improve Outcome: Progressing   

## 2020-11-01 NOTE — Progress Notes (Signed)
HD#19 Subjective:  Overnight Events: NAEOV  Patient's mental status unchanged during encounter this AM. Still somnolent but occasional opens eyes on command. Per RN, patient was not able to swallow Breakfast (Nectar/puree) without massaging cheeks.   Objective:  Vital signs in last 24 hours: Vitals:   11/01/20 0012 11/01/20 0439 11/01/20 0749 11/01/20 1234  BP: (!) 106/54 (!) 92/43 (!) 92/43 (!) 100/57  Pulse: (!) 50 (!) 53 (!) 54 (!) 51  Resp: 16 16 16 14   Temp: 98.7 F (37.1 C) 98.4 F (36.9 C) 98.2 F (36.8 C) 98.4 F (36.9 C)  TempSrc: Oral Oral Oral Oral  SpO2: 97% 97% 100% 100%  Weight:      Height:       Supplemental O2: Room Air SpO2: 97 % O2 Flow Rate (L/min): 1 L/min FiO2 (%): 100 %   Physical Exam:   General: Chronically ill and cachectic woman laying on her left side. No acute distress HEENT: MMM. Still with occasional drooling. Less tongue fasciculations today CV: Stable bradycardia. Regular rhythm. No m/r/g. No LE edema Pulm: Lungs CTAB. No wheezing or rales. Abd: Soft. NT/ND. Normal BS.  Extremities: 2+ distal pulses. Mildly contracted upper extremities Skin: Warm and dry. Neuro: Still somnolent. Responds occassionally to voice. Opens eyes spontaneously.  Filed Weights   10/29/20 0500 10/30/20 0433 10/31/20 0409  Weight: 62 kg 58.1 kg 62.6 kg     Intake/Output Summary (Last 24 hours) at 11/01/2020 12/30/2020 Last data filed at 11/01/2020 0500 Gross per 24 hour  Intake 3500 ml  Output 900 ml  Net 2600 ml   Net IO Since Admission: 9,290.74 mL [11/01/20 0712]  Recent Labs    10/31/20 1937 11/01/20 0009 11/01/20 0433  GLUCAP 112* 72 112*     Pertinent Labs: CBC Latest Ref Rng & Units 11/01/2020 10/31/2020 10/30/2020  WBC 4.0 - 10.5 K/uL 4.8 4.6 5.3  Hemoglobin 12.0 - 15.0 g/dL 12/28/2020) 3.3(A) 2.5(K)  Hematocrit 36.0 - 46.0 % 28.7(L) 27.9(L) 28.5(L)  Platelets 150 - 400 K/uL 343 391 366    CMP Latest Ref Rng & Units 11/01/2020 10/31/2020 10/30/2020   Glucose 70 - 99 mg/dL 81 12/28/2020) 767(H)  BUN 8 - 23 mg/dL 18 20 21   Creatinine 0.44 - 1.00 mg/dL 419(F 7.90  Sodium 135 - 145 mmol/L 147(H) 145 142  Potassium 3.5 - 5.1 mmol/L 3.6 3.6 3.9  Chloride 98 - 111 mmol/L 107 106 105  CO2 22 - 32 mmol/L 30 30 26   Calcium 8.9 - 10.3 mg/dL 8.0(L) 8.0(L) 7.9(L)  Total Protein 6.5 - 8.1 g/dL - - -  Total Bilirubin 0.3 - 1.2 mg/dL - - -  Alkaline Phos 38 - 126 U/L - - -  AST 15 - 41 U/L - - -  ALT 0 - 44 U/L - - -    Imaging: No results found.  Assessment/Plan:   Principal Problem:   Alzheimer's disease (HCC) Active Problems:   Altered mental status   Seizures (HCC)   Severe sepsis (HCC)   COVID-19 virus infection   Pneumonia   Dysphagia   Pressure injury of skin   Protein-calorie malnutrition, severe   Aspiration into lower respiratory tract   Patient Summary: Janet Gutierrez is a 75 y.o.  female with hx of vascular dementia, prior stroke, dysphagia, and seizure disorderpresenting with septic shock 2/2 pneumonia and possibly UTI. Had extended ICU stay and completed 7 days of Zosyn, transferred to our service on 1/26. Family has refused SNF  placement. Pending likely palliation vs. Home w/ HH.   End-staged Vascular dementia Failure to thrive Goals of care Patient made some progress with feeding yesterday according to SLP. She was able to swallow with stimulation and massaging of cheeks. However, patient unable to tolerate Nectar/puree breakfast this morning without constant motivation/stimulation. Patient a poor candidate for PEG tube. Plan to revisit conversation with son about our recommendations and realistic expectations as it is unsafe to discharge patient at current condition due to aspiration risk and possible electrolyte derangements. We may be approaching futility of care.  --Palliative care re-consulted, appreciate assistance --SLP following, recommending dysphagia 1 diet --Pending discussion with son about  GOC  Septic shock 2/2 PNA vs. UTI, improved AHRF, resolved On room with appropriate O2 sats. BP remains soft but stable. BP of 100/57. Will continue to monitor. Plan to change steroids to oral if able to swallow.  --Continue Solu-Cortef 50 mg daily for now --Continue midodrine 5 mg TID    Bradycardia, stable HR stable in the 50s.  --Continue Midodrine 5 mg TID  Mild Hypernatremia Sodium elevated to 147. No other electrolyte abnormalities. Likely 2/2 decreased fluid intake in the setting in the setting deconditioned state. --Continue tube feeds w/ free water  --Daily BMP  Acute on chronic anemia Baseline is around 11. Hbg stable at 8.9. No signs of active bleed. Will continue to monitor.  --Daily CBC  RUE swelling No DVT on RUE on 2/01.  --Continue to monitor  Right Knee Effusion No signs of infection.  --Continue to monitor  Diet: NPO, Tube feeds IVF: IVNS VTE: enoxaparin (LOVENOX) injection 40 mg Start: 10/13/20 2100 Code: Full PT/OT: SNF for Subacute PT ID:  Anti-infectives (From admission, onward)   Start     Dose/Rate Route Frequency Ordered Stop   10/14/20 1000  remdesivir 100 mg in sodium chloride 0.9 % 100 mL IVPB  Status:  Discontinued       "Followed by" Linked Group Details   100 mg 200 mL/hr over 30 Minutes Intravenous Daily 10/13/20 1501 10/13/20 1557   10/14/20 1000  vancomycin (VANCOCIN) IVPB 1000 mg/200 mL premix  Status:  Discontinued        1,000 mg 200 mL/hr over 60 Minutes Intravenous Every 24 hours 10/13/20 2108 10/16/20 0751   10/14/20 0800  piperacillin-tazobactam (ZOSYN) IVPB 3.375 g        3.375 g 12.5 mL/hr over 240 Minutes Intravenous Every 8 hours 10/14/20 0741 10/21/20 0150   10/14/20 0200  ceFEPIme (MAXIPIME) 2 g in sodium chloride 0.9 % 100 mL IVPB  Status:  Discontinued        2 g 200 mL/hr over 30 Minutes Intravenous Every 12 hours 10/13/20 1700 10/14/20 0739   10/13/20 1515  remdesivir 200 mg in sodium chloride 0.9% 250 mL IVPB   Status:  Discontinued       "Followed by" Linked Group Details   200 mg 580 mL/hr over 30 Minutes Intravenous Once 10/13/20 1501 10/13/20 1557   10/13/20 1345  vancomycin (VANCOCIN) IVPB 1000 mg/200 mL premix  Status:  Discontinued        1,000 mg 200 mL/hr over 60 Minutes Intravenous  Once 10/13/20 1330 10/13/20 1342   10/13/20 1345  ceFEPIme (MAXIPIME) 2 g in sodium chloride 0.9 % 100 mL IVPB        2 g 200 mL/hr over 30 Minutes Intravenous  Once 10/13/20 1330 10/13/20 1502   10/13/20 1345  vancomycin (VANCOREADY) IVPB 1250 mg/250 mL  1,250 mg 166.7 mL/hr over 90 Minutes Intravenous  Once 10/13/20 1342 10/13/20 1621       Anticipated discharge to Home due to son preference in 1-2 days pending ability to swallow.  Steffanie Rainwater, MD 11/01/2020, 7:12 AM Pager: [phone] [facility]  Please contact the on call pager after 5 pm and on weekends at [phone].

## 2020-11-02 LAB — CBC
HCT: 28.7 % — ABNORMAL LOW (ref 36.0–46.0)
Hemoglobin: 8.9 g/dL — ABNORMAL LOW (ref 12.0–15.0)
MCH: 33.1 pg (ref 26.0–34.0)
MCHC: 31 g/dL (ref 30.0–36.0)
MCV: 106.7 fL — ABNORMAL HIGH (ref 80.0–100.0)
Platelets: 298 10*3/uL (ref 150–400)
RBC: 2.69 MIL/uL — ABNORMAL LOW (ref 3.87–5.11)
RDW: 18.2 % — ABNORMAL HIGH (ref 11.5–15.5)
WBC: 5.3 10*3/uL (ref 4.0–10.5)
nRBC: 0 % (ref 0.0–0.2)

## 2020-11-02 LAB — GLUCOSE, CAPILLARY
Glucose-Capillary: 105 mg/dL — ABNORMAL HIGH (ref 70–99)
Glucose-Capillary: 110 mg/dL — ABNORMAL HIGH (ref 70–99)
Glucose-Capillary: 113 mg/dL — ABNORMAL HIGH (ref 70–99)
Glucose-Capillary: 118 mg/dL — ABNORMAL HIGH (ref 70–99)
Glucose-Capillary: 76 mg/dL (ref 70–99)
Glucose-Capillary: 92 mg/dL (ref 70–99)

## 2020-11-02 LAB — BASIC METABOLIC PANEL
Anion gap: 9 (ref 5–15)
BUN: 20 mg/dL (ref 8–23)
CO2: 30 mmol/L (ref 22–32)
Calcium: 7.9 mg/dL — ABNORMAL LOW (ref 8.9–10.3)
Chloride: 105 mmol/L (ref 98–111)
Creatinine, Ser: 0.4 mg/dL — ABNORMAL LOW (ref 0.44–1.00)
GFR, Estimated: >60 mL/min (ref >60)
Glucose, Bld: 99 mg/dL (ref 70–99)
Potassium: 3.7 mmol/L (ref 3.5–5.1)
Sodium: 144 mmol/L (ref 135–145)

## 2020-11-02 MED ORDER — LACTATED RINGERS IV BOLUS
1000.0000 mL | Freq: Once | INTRAVENOUS | Status: AC
  Administered 2020-11-02: 1000 mL via INTRAVENOUS

## 2020-11-02 MED ORDER — ENSURE ENLIVE PO LIQD
237.0000 mL | Freq: Three times a day (TID) | ORAL | Status: DC
  Administered 2020-11-03 (×2): 237 mL via ORAL

## 2020-11-02 MED ORDER — LACTATED RINGERS IV SOLN: INTRAVENOUS | Status: DC

## 2020-11-02 NOTE — Progress Notes (Addendum)
Attempted to feed pt breakfast and GIVE PT MEDICATIONS because of MD ORDER to pull NG tube; GAVE PT SMALL BITES OF BREAKFAST TRAY AND MEDICATIONS; ASSESSED PT MOUTH AFTER BITES OF FOOD AND MEDICATIONS TO ENSURE SHE WAS SWALLOWING; FOUND FOOD AND MEDICATIONS IN PATIENT'S LEFT CHEEK; TRIED TO MASSAGE PATIENT'S CHEEK SEVERAL TIMES SINCE IT WAS NOTED THAT THIS STIMULATES PT TO SWALLOW PER SON AND SPEECH; PT SPIT CONTENTS IN MOUTH ONTO TOWEL LAYING ACROSS PT CHEST. Will continue to monitor.   1745: NG tube removed per MD order. Will continue to monitor.

## 2020-11-02 NOTE — Discharge Summary (Incomplete)
Name: Janet Gutierrez MRN: [id number] DOB: 1946-06-20 75 y.o. PCP: Patient, No Pcp Per  Date of Admission: 10/13/2020 12:59 PM Date of Discharge:  *** Attending Physician: Tyson Alias, * ***  Discharge Diagnosis: 1. Sepsis secondary to community-acquired pneumonia 2. Acute hypoxic respiratory failure 3. Bradycardia 4. Vascular dementia 5. Failure to thrive  Discharge Medications: Allergies as of 10/27/2020   No Known Allergies   Med Rec must be completed prior to using this [facility]***       Disposition and follow-up:   [name] was discharged from [facility] in {DISCHARGE CONDITION:19696} condition.  At the hospital follow up visit please address:  1.  Follow up: Marland Kitchen Dysphagia: Patient back to baseline feeding-wise. Son will continue to feed her at home. . Hypotension: Patient was persistently hypotension to systolic in 90s-100s.   2.  Labs / imaging needed at time of follow-up: None  3.  Pending labs/ test needing follow-up: None  Follow-up Appointments: Needs to follow up with PCP at [facility] Course by problem list:  Septic shock 2/2 pneumonia vs UTI Acute hypoxic respiratory failure Hypotension Presented in septic shock, CXR with pneumonia and urine culture grew strep agalactiae and staph simulans. Admitted to ICU and Required Levophed and stress dose steroids. Treated with 7 days Zosyn, fever and white count resolved. Also weaned to room air and saturating well. Transferred out of ICU, but still required high doses of midodrine as well as Solu-cortef and fludrocortisone, as well as fluid boluses throughout the day. Initial echo showed some pericardial effusion, but repeat echo with decreased volume of that and no sign of tamponade. Blood pressure remained soft after weaning of steroids. She remained on Midodrine at discharge.   Bradycardia Noted to have profound bradycardia in 30s-40s, but this gradually improved  as midodrine was weaned. Heart rate was persistently in the 50s til day of discharge.   Acute on chronic encephalopathy Vascular dementia Patient remained persistently encephalopathic, does not respond to verbal stimuli or follow verbal commands. Son states that at baseline, she is unable to communicate or understand most words, but does respond to tactile and other cues that they have developed over the years. She does seem more active in the evenings. SLP recommended NPO due to persistently poor swallow. Son assists very heavily with nutrition at home, spoon feeding her and prompting her to chew with tactile cues. Received nutrition through tube feeds during admission, and son feels that he can help her take PO adequately at home. PT recommended SNF for patient but son expressed his desire to take her home. Medical team expressed concerns about discharging home due to patient's high risk of aspiration. Son continued to expressed desire to take patient home regardless of the medical recommendation to discharge to SNF or home with hospice. After further discussions with palliative care, it was     Subjective on day of discharge: ***  Discharge Exam:   BP (!) 101/50 (BP Location: Left Arm)   Pulse 63   Temp 98.9 F (37.2 C) (Oral)   Resp 19   Ht 5\' 4"  (1.626 m)   Wt 60.7 kg   SpO2 100%   BMI 22.97 kg/m  Discharge exam: Physical Exam  Pertinent Labs, Studies, and Procedures:  DG Abd 1 View  Result Date: 10/14/2020 CLINICAL DATA:  Evaluate OG tube position EXAM: ABDOMEN - 1 VIEW COMPARISON:  None. FINDINGS: Limited frontal radiograph of the abdomen provided. The orogastric tube courses below  the diaphragm with tip likely post pyloric in the second portion of the duodenum. The side port is likely in the pylorus or duodenal bulb. Nonobstructive bowel gas pattern. Opacification of the left lung base, as seen on prior studies. IMPRESSION: Orogastric tube tip is likely post pyloric in the second  portion of the duodenum with side port in the pylorus or duodenal bulb. If gastric placement is desired, recommend short retraction by approximately 4-5 cm. Electronically Signed   By: Emmaline Kluver M.D.   On: 10/14/2020 15:55   CT Head Wo Contrast  Result Date: 10/13/2020 CLINICAL DATA:  Mental status change.  COVID-19 positive. EXAM: CT HEAD WITHOUT CONTRAST TECHNIQUE: Contiguous axial images were obtained from the base of the skull through the vertex without intravenous contrast. COMPARISON:  05/01/2017 FINDINGS: Brain: Examination demonstrates mild prominence of the ventricles, cisterns and CSF spaces likely representing mild atrophic change. There is minimal chronic ischemic microvascular disease. Evidence of previous right occipital craniectomy with underlying encephalomalacia over the right cerebellar hemisphere unchanged. No mass, mass effect or shift of midline structures. No acute hemorrhage or acute infarction. Vascular: No hyperdense vessel or unexpected calcification. Skull: Stable postsurgical change compatible prior right occipital craniectomy. Sinuses/Orbits: Orbits are normal symmetric. Minimal opacification over the left sphenoid sinus. Hypoplastic frontal sinuses. Mastoid air cells are clear. Other: None. IMPRESSION: 1. No acute findings. 2. Mild atrophic change and chronic ischemic microvascular disease. 3. Stable right occipital craniectomy with underlying encephalomalacia over the right cerebellar hemisphere. 4. Minimal chronic sinus inflammatory change. Electronically Signed   By: Elberta Fortis M.D.   On: 10/13/2020 14:41   CT ANGIO CHEST PE W OR WO CONTRAST  Result Date: 10/13/2020 CLINICAL DATA:  Shortness of breath, COVID positive EXAM: CT ANGIOGRAPHY CHEST WITH CONTRAST TECHNIQUE: Multidetector CT imaging of the chest was performed using the standard protocol during bolus administration of intravenous contrast. Multiplanar CT image reconstructions and MIPs were obtained to  evaluate the vascular anatomy. CONTRAST:  65mL OMNIPAQUE IOHEXOL 300 MG/ML  SOLN COMPARISON:  None. FINDINGS: Cardiovascular: There is a optimal opacification of the pulmonary arteries. There is no central,segmental, or subsegmental filling defects within the pulmonary arteries. The heart is normal in size. A small pericardial effusion is seen. No evidence right heart strain. There is normal three-vessel brachiocephalic anatomy without proximal stenosis. The thoracic aorta is normal in appearance. Mediastinum/Nodes: No hilar, mediastinal, or axillary adenopathy. Thyroid gland, trachea, and esophagus demonstrate no significant findings. Lungs/Pleura: There is mucous plugging seen within the left mainstem bronchus and throughout the left bronchials. There is large area consolidation/atelectasis seen throughout the right lung with only a small degree of aeration seen within the anterior left upper lobe. Loculated small left pleural effusion is noted. There is also patchy airspace opacity in the posterior right lung base. Upper Abdomen: No acute abnormalities present in the visualized portions of the upper abdomen. Musculoskeletal: No chest wall abnormality. No acute or significant osseous findings. Review of the MIP images confirms the above findings. IMPRESSION: No central, segmental, or subsegmental pulmonary embolism. Mucous plugging within the left mainstem bronchus and throughout the bronchials. There is near complete consolidation throughout the left lung which could be due to atelectasis and/or multifocal pneumonia Small loculated left pleural effusion Patchy airspace opacity in the posterior right lung base which could be due to aspiration or pneumonia. Electronically Signed   By: Jonna Clark M.D.   On: 10/13/2020 23:23   DG Chest Port 1 View  Result Date: 10/13/2020 CLINICAL DATA:  Sepsis. EXAM: PORTABLE CHEST 1 VIEW COMPARISON:  March 22, 2020. FINDINGS: The heart size and mediastinal contours are within  normal limits. Right lung is clear. Interval development of left midlung and basilar opacity is noted concerning for pneumonia with possible associated pleural effusion. No pneumothorax is noted. The visualized skeletal structures are unremarkable. IMPRESSION: Interval development of left midlung and basilar opacity concerning for pneumonia with possible associated pleural effusion. Electronically Signed   By: Lupita Raider M.D.   On: 10/13/2020 14:11   EEG adult  Result Date: 10/13/2020 Charlsie Quest, MD     10/13/2020 10:23 PM Patient Name: Patti Shorb MRN: [id number] Epilepsy Attending: Charlsie Quest Referring Physician/Provider: Dr Orland Mustard Date: 10/13/2020 Duration: 25.36 mins Patient history: 74yo F with AMS. EEG to evaluate for seizure. Level of alertness: Awake AEDs during EEG study: LEV Technical aspects: This EEG study was done with scalp electrodes positioned according to the 10-20 International system of electrode placement. Electrical activity was acquired at a sampling rate of 500Hz  and reviewed with a high frequency filter of 70Hz  and a low frequency filter of 1Hz . EEG data were recorded continuously and digitally stored. Description: No posterior dominant rhythm was seen. 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 There is also continuous generalized 3 to 6 Hz theta-delta slowing. Hyperventilation and photic stimulation were not performed.   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 The periodic epileptiform discharges are at 1Hz  and at times rhythmic which is on the ictal-interictal continuum with high potential for seizure. There is also moderate  diffuse encephalopathy, non specific etiology. No seizures were seen throughout the recording.    ***  Discharge Instructions:   Signed: , MD 10/27/2020, 11:12 AM   Pager: [phone]

## 2020-11-02 NOTE — Care Management Important Message (Signed)
Important Message  Patient Details  Name: Najat Olazabal MRN: [id number] Date of Birth: 1946-04-18   Medicare Important Message Given:  Yes     Renie Ora 11/02/2020, 10:15 AM

## 2020-11-02 NOTE — Progress Notes (Signed)
  Speech Language Pathology Treatment: Dysphagia  Patient Details Name: Janet Gutierrez MRN: [id number] DOB: Jan 13, 1946 Today's Date: 11/02/2020 Time: 0920-0940 SLP Time Calculation (min) (ACUTE ONLY): 20 min  Assessment / Plan / Recommendation Clinical Impression  Pt again able to consume PO this am, a little more cueing and stim needed today, but still consumed 100% of a pudding up and 100% of an ice cream cup, 25% of a vanilla ensure. Still has tube feed running at this time. RN reports that she did not fell pt did well with breakfast attempt as pt was pocketing on the left and spitting out food. Demonstrated feeding with pressure to left buccal cavity to reduce pooling, tactile stim to elicit initiation of oral movement and need to use a spoon to replace bolus as needed. Recommend pt continue current diet and receive meds crushed in puree exclusively orally to be sure pt can consistently take meds. Also suggest tube feeding be stopped though tube can remain in place if needed until d/c plan in place.    HPI HPI: 75 year old female w/ hx of vascular dementia, seizures, known COVID positive sent from assisted living with AMS, difficulty swallowing, hypoxia, and fever.  Found to have pneumonia and possible UTI, ongoing hypotension in ER despite IVFs.  Hx CVA at age 66. Now with septic shock secondary to pna, likely aspiration; encephalopathy.      SLP Plan  Continue with current plan of care       Recommendations  Diet recommendations: Dysphagia 1 (puree);Nectar-thick liquid Liquids provided via: Straw Medication Administration: Crushed with puree Supervision: Staff to assist with self feeding Compensations: Monitor for anterior loss                Oral Care Recommendations: Oral care BID Follow up Recommendations: Home health SLP;24 hour supervision/assistance SLP Visit Diagnosis: Dysphagia, unspecified (R13.10) Plan: Continue with current plan of care       GO                Harlon Ditty, MA CCC-SLP  Acute Rehabilitation Services Pager [phone] Office [phone]  Claudine Mouton 11/02/2020, 10:32 AM

## 2020-11-02 NOTE — Progress Notes (Signed)
Nutrition Follow-up  DOCUMENTATION CODES:   Severe malnutrition in context of chronic illness  INTERVENTION:   -Ensure Enlive po TID, each supplement provides 350 kcal and 20 grams of protein -Feeding assistance with meals -Magic cup TID with meals, each supplement provides 290 kcal and 9 grams of protein -D/c TF  NUTRITION DIAGNOSIS:   Severe Malnutrition related to chronic illness (dementia) as evidenced by severe fat depletion,severe muscle depletion.  Ongoing  GOAL:   Patient will meet greater than or equal to 90% of their needs  Progressing   MONITOR:   Diet advancement,TF tolerance,Weight trends,Supplement acceptance,PO intake,Labs,I & O's  REASON FOR ASSESSMENT:   Consult Assessment of nutrition requirement/status  ASSESSMENT:   Patient with PMH significant for CVA with residual R hemiparesis, seizure disorder, vascular dementia, and known COVID positive (09/26/21). Presents this admission with PNA and possible UTI.  1/18- gastric Cortrak placed 2/2- s/p BSE- advanced to dysphagia 1 diet with nectar thick liquids  Reviewed I/O's: +240 ml x 24 hours and +7.7 L since 10/19/20  Pt more alert in comparison to last visit. Pt opened her eyes and turned her head towards RD when her name was called, but she did not answer questions. Noted pt consumed about 1/3 of Ensure Enlive supplement. RN reports concern regarding pt pocketing food.   Pt continues to rely on TF for nutrition. She is receiving Osmolite 1.5 @ 50 ml via cortrak tube with 45 ml Prosource TF BID, and 100 ml free water flush every 4 hours. Complete regimen provides 1880 kcals, 97 grams protein, and 914 ml (1514 ml) free water daily, meeting 100% of estimated kcal and protein needs.RD suspects that pt will be unable to meet nutritional needs via oral intake alone. Per MD notes, plan to remove cortrak today. Pt will discharge home over the weekend. Pt son is willing to accept aspiration risk and will carefully  hand feed pt.   Palliative care following of goals of care.   Labs reviewed: CBGS: 92-113 (inpatient orders for glycemic control are 0-6 units insulin aspart every 4 hours).   Diet Order:   Diet Order            DIET - DYS 1 Room service appropriate? No; Fluid consistency: Nectar Thick  Diet effective now                 EDUCATION NEEDS:   Not appropriate for education at this time  Skin:  Skin Assessment: Skin Integrity Issues: Skin Integrity Issues:: DTI,Stage I DTI: sacrum Stage I: R heel  Last BM:  11/02/20  Height:   Ht Readings from Last 1 Encounters:  10/14/20 5\' 4"  (1.626 m)    Weight:   Wt Readings from Last 1 Encounters:  11/02/20 62.9 kg   BMI:  Body mass index is 23.8 kg/m.  Estimated Nutritional Needs:   Kcal:  1600-1900 kcal  Protein:  85-100 grams  Fluid:  >/= 1.6 L/day    12/31/20, RD, LDN, CDCES Registered Dietitian II Certified Diabetes Care and Education Specialist Please refer to [facility] for RD and/or RD on-call/weekend/after hours pager

## 2020-11-02 NOTE — Progress Notes (Signed)
HD#20 Subjective:  Overnight Events: Became hypotensive with MAP below 60, given 500cc x2.   Patient was evaluated at the bedside laying comfortably in bed. Patient still somnolent. Grimaces to painful stimuli and opens eyes spontaneously but does not follow commands.   Objective:  Vital signs in last 24 hours: Vitals:   11/01/20 1735 11/01/20 1842 11/01/20 2012 11/02/20 0514  BP: (!) 98/40 (!) 108/49 (!) 110/45 (!) 116/95  Pulse: (!) 56 (!) 54 (!) 56 (!) 59  Resp:   18 18  Temp:   99.3 F (37.4 C) 99 F (37.2 C)  TempSrc:   Oral Oral  SpO2:   100% 98%  Weight:    62.9 kg  Height:       Supplemental O2: Room Air SpO2: 98 % O2 Flow Rate (L/min): 1 L/min FiO2 (%): 100 %   Physical Exam:   General: Chronically ill and cachectic elderly woman in bed.  No acute distress.   HEENT: Occasional drooling.  CV: Bradycardic. Regular rhythm. No m/r/g. No LE edema. Pulm: Lungs CTAB. No wheezing or rales. Abd: Soft. NT/ND. Normal BS.  Extremities: 2+ distal pulses. Mildly contracted upper extremities. Skin: Warm and dry. No obvious lesions.  Neuro: Somnolent on exam. Spontaneously opens eyes and moves. Does not follow commands. Occasional tongue fasciculation.   Filed Weights   10/30/20 0433 10/31/20 0409 11/02/20 0514  Weight: 58.1 kg 62.6 kg 62.9 kg     Intake/Output Summary (Last 24 hours) at 11/02/2020 0601 Last data filed at 11/01/2020 2200 Gross per 24 hour  Intake 240 ml  Output --  Net 240 ml   Net IO Since Admission: 9,530.74 mL [11/02/20 0601]  Recent Labs    11/01/20 2010 11/02/20 0043 11/02/20 0429  GLUCAP 106* 118* 113*     Pertinent Labs: CBC Latest Ref Rng & Units 11/01/2020 10/31/2020 10/30/2020  WBC 4.0 - 10.5 K/uL 4.8 4.6 5.3  Hemoglobin 12.0 - 15.0 g/dL 7.9(K) 2.4(O) 9.7(D)  Hematocrit 36.0 - 46.0 % 28.7(L) 27.9(L) 28.5(L)  Platelets 150 - 400 K/uL 343 391 366    CMP Latest Ref Rng & Units 11/01/2020 10/31/2020 10/30/2020  Glucose 70 - 99 mg/dL 81  532(D) 924(Q)  BUN 8 - 23 mg/dL 18 20 21   Creatinine 0.44 - 1.00 mg/dL 6.83 4.19  Sodium 135 - 145 mmol/L 147(H) 145 142  Potassium 3.5 - 5.1 mmol/L 3.6 3.6 3.9  Chloride 98 - 111 mmol/L 107 106 105  CO2 22 - 32 mmol/L 30 30 26   Calcium 8.9 - 10.3 mg/dL 8.0(L) 8.0(L) 7.9(L)  Total Protein 6.5 - 8.1 g/dL - - -  Total Bilirubin 0.3 - 1.2 mg/dL - - -  Alkaline Phos 38 - 126 U/L - - -  AST 15 - 41 U/L - - -  ALT 0 - 44 U/L - - -    Imaging: No results found.  Assessment/Plan:   Principal Problem:   Alzheimer's disease (HCC) Active Problems:   Altered mental status   Seizures (HCC)   Severe sepsis (HCC)   COVID-19 virus infection   Pneumonia   Dysphagia   Pressure injury of skin   Protein-calorie malnutrition, severe   Aspiration into lower respiratory tract   Goals of care, counseling/discussion   Patient Summary: Janet Gutierrez is a 75 y.o.  female with hx of vascular dementia, prior stroke, dysphagia, and seizure disorderpresenting with septic shock 2/2 pneumonia and possibly UTI. Had extended ICU stay and completed 7 days of  Zosyn, transferred to our service on 1/26. Family has refused SNF placement. Pending discharge home to follow up with [facility] doctor.   End-staged Vascular dementia Failure to thrive Goals of care Patient's mental status and functional status unchanged today. She continue to have cortrak for tube feeds with some drooling. Per SLP, patient's ability to eat and drink will not improve beyond this point. Patient back to her baseline according to discussions with her son. Medical team have expressed our concerns to son about her high risk of aspiration if discharged home. Son okay with carefully hand-feeding her at home and declines discharge to SNF. We have approach medical futility at this point but will respect son's wishes to continue feeding patient at home. Discussed with son at bedside about our plans and he requested we give him a day to talk  with his wife and prepare for her discharge. Called [facility]CP [facility] to give an update on plans for discharge this weekend.  --SLP following, appreciate eval      --Patient back to baseline      --Recommend dysphagia 1 (puree) diet, home health with SLP.  --Palliative care re-consulted, appreciate assistance with this difficulty situation      --Do not offer or initial any inefective medical intervention       --Patient back to her baseline      --Remove cortrak      --Discharge home with [facility]. --Plan to discontinue cortrak today --Plan for discharge home this weekend.   Septic shock 2/2 PNA vs. UTI, improved AHRF, resolved Hypotensive overnight but BP improved to 116/95 with 500 cc LR x2. Increased free water to Q3H. Became hypotensive this AM with sBP in the 70s.  --Giving a bolus of LR --Discontinuing IV Solu-Cortef 50 mg --Continue midodrine 5 mg TID  Bradycardia, stable Stable. She has had HR in the 50s throughout most of her hospitalization. --Off tele --Continue to monitor  Mild Hypernatremia, resolved.  Sodium improved from 147 to 144 status post IVF.  --Discontinue tube feeds --Dysphagia 1 diet per SLP --Continue tube feeds w/ free water  --Daily BMP  Acute on chronic anemia Baseline is around 11.Hgb stable at 8.9. No signs of active bleeding. --Follow up with outpatient [facility] doctor --Monitor with CBC   RUE swelling No DVT on RUE U/S on 02/01  Right Knee Effusion No signs of efusions IVF: IVNS Diet: NPO, Tube feeds  VTE: enoxaparin (LOVENOX) injection 40 mg Start: 10/13/20 2100 Code: Full PT/OT: SNF for Subacute PT ID:  Anti-infectives (From admission, onward)   Start     Dose/Rate Route Frequency Ordered Stop   10/14/20 1000  remdesivir 100 mg in sodium chloride 0.9 % 100 mL IVPB  Status:  Discontinued       "Followed by" Linked Group Details   100 mg 200 mL/hr over 30 Minutes Intravenous Daily 10/13/20 1501 10/13/20 1557   10/14/20 1000  vancomycin  (VANCOCIN) IVPB 1000 mg/200 mL premix  Status:  Discontinued        1,000 mg 200 mL/hr over 60 Minutes Intravenous Every 24 hours 10/13/20 2108 10/16/20 0751   10/14/20 0800  piperacillin-tazobactam (ZOSYN) IVPB 3.375 g        3.375 g 12.5 mL/hr over 240 Minutes Intravenous Every 8 hours 10/14/20 0741 10/21/20 0150   10/14/20 0200  ceFEPIme (MAXIPIME) 2 g in sodium chloride 0.9 % 100 mL IVPB  Status:  Discontinued        2 g 200 mL/hr over 30 Minutes  Intravenous Every 12 hours 10/13/20 1700 10/14/20 0739   10/13/20 1515  remdesivir 200 mg in sodium chloride 0.9% 250 mL IVPB  Status:  Discontinued       "Followed by" Linked Group Details   200 mg 580 mL/hr over 30 Minutes Intravenous Once 10/13/20 1501 10/13/20 1557   10/13/20 1345  vancomycin (VANCOCIN) IVPB 1000 mg/200 mL premix  Status:  Discontinued        1,000 mg 200 mL/hr over 60 Minutes Intravenous  Once 10/13/20 1330 10/13/20 1342   10/13/20 1345  ceFEPIme (MAXIPIME) 2 g in sodium chloride 0.9 % 100 mL IVPB        2 g 200 mL/hr over 30 Minutes Intravenous  Once 10/13/20 1330 10/13/20 1502   10/13/20 1345  vancomycin (VANCOREADY) IVPB 1250 mg/250 mL        1,250 mg 166.7 mL/hr over 90 Minutes Intravenous  Once 10/13/20 1342 10/13/20 1621       Anticipated discharge to Home due to son preference in 1-2 days pending ability to swallow.  Steffanie Rainwater, MD 11/02/2020, 6:01 AM Pager: [phone] [facility]  Please contact the on call pager after 5 pm and on weekends at [phone].

## 2020-11-02 NOTE — Plan of Care (Signed)
  Problem: Safety: Goal: Ability to remain free from injury will improve Outcome: Progressing   

## 2020-11-03 LAB — GLUCOSE, CAPILLARY
Glucose-Capillary: 74 mg/dL (ref 70–99)
Glucose-Capillary: 76 mg/dL (ref 70–99)
Glucose-Capillary: 91 mg/dL (ref 70–99)

## 2020-11-03 MED ORDER — LEVETIRACETAM 100 MG/ML PO SOLN
500.0000 mg | Freq: Two times a day (BID) | ORAL | Status: DC
  Administered 2020-11-03: 500 mg via ORAL
  Filled 2020-11-03 (×2): qty 5

## 2020-11-03 MED ORDER — MIDODRINE HCL 5 MG PO TABS: 5.0000 mg | ORAL_TABLET | Freq: Three times a day (TID) | ORAL | 0 refills | Status: DC

## 2020-11-03 NOTE — TOC Transition Note (Signed)
Transition of Care [facility]) - CM/SW Discharge Note   Patient Details  Name: Keyani Rigdon MRN: [id number] Date of Birth: 04-18-1946  [facility] CM/SW Contact:  Leone Haven, RN Phone Number: 11/03/2020, 11:37 AM   Clinical Narrative:    Patient is for dc today home, she is a [facility] patient, NCM contacted [facility] and spoke with Shawna Orleans, informed her that this patient is for dc today and will need HHRN,HHAIDE set up thru [facility] when they come in on Monday.  NCM called Quinton the son, did not get an answer, NCM contacted daughter , Erin Hearing, she states they will be on their way to transport patient home now. NCM informed her patient will need ambulance transport , she states , No, that her husband is 6"9" and and can pick her up and put her in their [place].  NCM informed Samatha also that HHRN , HHAIDE will be set up by [facility] on Monday, she states that is fine, they take care of her at home. NCM informed Staff RN of this information as well.    Final next level of care: Home w Home Health Services Barriers to Discharge: No Barriers Identified   Patient Goals and CMS Choice Patient states their goals for this hospitalization and ongoing recovery are:: Son wants pt to remain at home doing [facility] daily rehab   Choice offered to / list presented to : NA  Discharge Placement                       Discharge Plan and Services                          HH Arranged: NA          Social Determinants of Health (SDOH) Interventions     Readmission Risk Interventions No flowsheet data found.

## 2020-12-21 ENCOUNTER — Encounter: Payer: Self-pay | Admitting: Orthopedic Surgery

## 2020-12-21 NOTE — Progress Notes (Deleted)
Location:    [facility] Room Number: 422/W Place of Service:  SNF (31) Provider:  Hazle NordmannAmy Fargo NP  Patient, No Pcp Per  Patient Care Team: Patient, No Pcp Per as PCP - General (General Practice)  Extended Emergency Contact Information Primary Emergency Contact: Janet Gutierrez,Janet Gutierrez  [place] Home Phone: [phone] Mobile Phone: [phone] Relation: Son Secondary Emergency Contact: Janet Gutierrez,Janet Gutierrez          , [place] [id number] [place] Home Phone: [phone] Relation: Daughter  Code Status:  Full Code Goals of care: Advanced Directive information Advanced Directives 12/21/2020  Does Patient Have a Medical Advance Directive? No  Would patient like information on creating a medical advance directive? No - Patient declined     Chief Complaint  Patient presents with  . Hospitalization Follow-up    Hospitalization Follow Up     HPI:  Pt is a 75 y.o. female seen today for a hospital f/u s/p admission from  Past Medical History:  Diagnosis Date  . 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 Dementia [facility](HCC)    Past Surgical History:  Procedure Laterality Date  . BRAIN SURGERY     age 75    No Known Allergies  Allergies as of 12/21/2020   No Known Allergies     Medication List       Accurate as of December 21, 2020  3:30 PM. If you have any questions, ask your nurse or doctor.        ENSURE ENLIVE PO Take 8 fluid ounces by mouth 3 (three) times daily.   famotidine 10 MG tablet Commonly known as: PEPCID Take 10 mg by mouth daily.   lactulose 10 GM/15ML solution Commonly known as: CHRONULAC Take 30 g by mouth daily.   levETIRAcetam 500 MG tablet Commonly known as: Keppra Take 1 tablet (500 mg total) by mouth 2 (two) times daily.   midodrine 5 MG tablet Commonly known as: PROAMATINE Take 1 tablet (5 mg total) by mouth 3 (three) times daily  with meals.   multivitamin with minerals Tabs tablet Take 1 tablet by mouth daily.   sennosides-docusate sodium 8.6-50 MG tablet Commonly known as: SENOKOT-S Take 1 tablet by mouth in the morning and at bedtime.       Review of Systems  Immunization History  Administered Date(s) Administered  . Influenza-Unspecified 06/29/2016, 06/30/2019, 05/30/2020  . Pneumococcal-Unspecified 03/29/2017, 03/29/2018  . Tdap 03/29/2017  . Unspecified SARS-COV-2 Vaccination 08/29/2020, 09/29/2020   Pertinent  Health Maintenance Due  Topic Date Due  . COLONOSCOPY (Pts 45-1322yrs Insurance coverage will need to be confirmed)  Never done  . MAMMOGRAM  Never done  . DEXA SCAN  Never done  . PNA vac Low Risk Adult (2 of 2 - PCV13) 03/30/2019  . INFLUENZA VACCINE  Completed   No flowsheet data found. Functional Status Survey:    Vitals:   12/21/20 1525  BP: 110/62  Pulse: 72  Resp: 18  Temp: (!) 97 F (36.1 C)  SpO2: 97%  Weight: 131 lb 13.4 oz (59.8 kg)  Height: 5\' 7"  (1.702 m)   Body mass index is 20.65 kg/m. Physical Exam  Labs reviewed: Recent Labs    10/15/20 1620 10/16/20 0302 10/16/20 1704 10/17/20 0434 10/23/20 0040 10/24/20 0451 10/26/20 0515 10/27/20 0445 10/31/20 0415 11/01/20 0251 11/02/20 0624  NA 139 138  --    < >  142   < > 141   < > 145 147* 144  K 4.3 3.7  --    < > 3.7   < > 4.2   < > 3.6 3.6 3.7  CL 104 103  --    < > 104   < > 107   < > 106 107 105  CO2 25 27  --    < > 30   < > 24   < > 30 30 30   GLUCOSE 261* 228*  --    < > 172*   < > 186*   < > 178* 81 99  BUN 9 12  --    < > 23   < > 22   < > 20 18 20   CREATININE 0.53 0.49  --    < > 0.44   < > 0.46   < > 0.49 0.49 0.40*  CALCIUM 8.0* 8.1*  --    < > 8.3*   < > 7.9*   < > 8.0* 8.0* 7.9*  MG 2.0 2.0 1.9  --  2.3  --  2.1  --   --   --   --   PHOS 3.8 1.8* 2.7  --   --   --   --   --   --   --   --    < > = values in this interval not displayed.   Recent Labs    10/24/20 0451 10/26/20 0515  10/27/20 0445  AST 39 26 23  ALT 57* 39 33  ALKPHOS 98 86 85  BILITOT 0.4 <0.1* 0.3  PROT 5.0* 4.7* 4.9*  ALBUMIN 2.0* 2.0* 2.0*   Recent Labs    10/13/20 1350 10/13/20 1631 10/14/20 0109 10/14/20 1615 10/31/20 0415 11/01/20 0251 11/02/20 0624  WBC 15.7*  --  17.3*   < > 4.6 4.8 5.3  NEUTROABS 13.8*  --  15.2*  --   --   --   --   HGB 11.5*   < > 10.5*   < > 9.0* 8.9* 8.9*  HCT 33.8*   < > 32.2*   < > 27.9* 28.7* 28.7*  MCV 98.0  --  99.1   < > 103.7* 104.7* 106.7*  PLT 454*  --  375   < > 391 343 298   < > = values in this interval not displayed.   Lab Results  Component Value Date   TSH 0.402 10/14/2020   No results found for: HGBA1C Lab Results  Component Value Date   TRIG 55 10/13/2020    Significant Diagnostic Results in last 30 days:  DG Abd 1 View  Result Date: 10/14/2020 CLINICAL DATA:  Evaluate OG tube position EXAM: ABDOMEN - 1 VIEW COMPARISON:  None. FINDINGS: Limited frontal radiograph of the abdomen provided. The orogastric tube courses below the diaphragm with tip likely post pyloric in the second portion of the duodenum. The side port is likely in the pylorus or duodenal bulb. Nonobstructive bowel gas pattern. Opacification of the left lung base, as seen on prior studies. IMPRESSION: Orogastric tube tip is likely post pyloric in the second portion of the duodenum with side port in the pylorus or duodenal bulb. If gastric placement is desired, recommend short retraction by approximately 4-5 cm. Electronically Signed   By: 10/15/2020 M.D.   On: 10/14/2020 15:55   CT Head Wo Contrast  Result Date: 10/13/2020 CLINICAL DATA:  Mental status change.  COVID-19 positive.  EXAM: CT HEAD WITHOUT CONTRAST TECHNIQUE: Contiguous axial images were obtained from the base of the skull through the vertex without intravenous contrast. COMPARISON:  05/01/2017 FINDINGS: Brain: Examination demonstrates mild prominence of the ventricles, cisterns and CSF spaces likely  representing mild atrophic change. There is minimal chronic ischemic microvascular disease. Evidence of previous right occipital craniectomy with underlying encephalomalacia over the right cerebellar hemisphere unchanged. No mass, mass effect or shift of midline structures. No acute hemorrhage or acute infarction. Vascular: No hyperdense vessel or unexpected calcification. Skull: Stable postsurgical change compatible prior right occipital craniectomy. Sinuses/Orbits: Orbits are normal symmetric. Minimal opacification over the left sphenoid sinus. Hypoplastic frontal sinuses. Mastoid air cells are clear. Other: None. IMPRESSION: 1. No acute findings. 2. Mild atrophic change and chronic ischemic microvascular disease. 3. Stable right occipital craniectomy with underlying encephalomalacia over the right cerebellar hemisphere. 4. Minimal chronic sinus inflammatory change. Electronically Signed   By: Elberta Fortis M.D.   On: 10/13/2020 14:41   CT ANGIO CHEST PE W OR WO CONTRAST  Result Date: 10/13/2020 CLINICAL DATA:  Shortness of breath, COVID positive EXAM: CT ANGIOGRAPHY CHEST WITH CONTRAST TECHNIQUE: Multidetector CT imaging of the chest was performed using the standard protocol during bolus administration of intravenous contrast. Multiplanar CT image reconstructions and MIPs were obtained to evaluate the vascular anatomy. CONTRAST:  103mL OMNIPAQUE IOHEXOL 300 MG/ML  SOLN COMPARISON:  None. FINDINGS: Cardiovascular: There is a optimal opacification of the pulmonary arteries. There is no central,segmental, or subsegmental filling defects within the pulmonary arteries. The heart is normal in size. A small pericardial effusion is seen. No evidence right heart strain. There is normal three-vessel brachiocephalic anatomy without proximal stenosis. The thoracic aorta is normal in appearance. Mediastinum/Nodes: No hilar, mediastinal, or axillary adenopathy. Thyroid gland, trachea, and esophagus demonstrate no  significant findings. Lungs/Pleura: There is mucous plugging seen within the left mainstem bronchus and throughout the left bronchials. There is large area consolidation/atelectasis seen throughout the right lung with only a small degree of aeration seen within the anterior left upper lobe. Loculated small left pleural effusion is noted. There is also patchy airspace opacity in the posterior right lung base. Upper Abdomen: No acute abnormalities present in the visualized portions of the upper abdomen. Musculoskeletal: No chest wall abnormality. No acute or significant osseous findings. Review of the MIP images confirms the above findings. IMPRESSION: No central, segmental, or subsegmental pulmonary embolism. Mucous plugging within the left mainstem bronchus and throughout the bronchials. There is near complete consolidation throughout the left lung which could be due to atelectasis and/or multifocal pneumonia Small loculated left pleural effusion Patchy airspace opacity in the posterior right lung base which could be due to aspiration or pneumonia. Electronically Signed   By: Jonna Clark M.D.   On: 10/13/2020 23:23   DG Chest Port 1 View  Result Date: 10/13/2020 CLINICAL DATA:  Sepsis. EXAM: PORTABLE CHEST 1 VIEW COMPARISON:  March 22, 2020. FINDINGS: The heart size and mediastinal contours are within normal limits. Right lung is clear. Interval development of left midlung and basilar opacity is noted concerning for pneumonia with possible associated pleural effusion. No pneumothorax is noted. The visualized skeletal structures are unremarkable. IMPRESSION: Interval development of left midlung and basilar opacity concerning for pneumonia with possible associated pleural effusion. Electronically Signed   By: Lupita Raider M.D.   On: 10/13/2020 14:11   EEG adult  Result Date: 10/13/2020 Charlsie Quest, MD     10/13/2020 10:23 PM Patient Name: Janet Davenport  Melaine Gutierrez MRN: [id number] Epilepsy Attending: Charlsie Quest Referring Physician/Provider: Dr Orland Mustard Date: 10/13/2020 Duration: 25.36 mins Patient history: 74yo F with AMS. EEG to evaluate for seizure. Level of alertness: Awake AEDs during EEG study: LEV Technical aspects: This EEG study was done with scalp electrodes positioned according to the 10-20 International system of electrode placement. Electrical activity was acquired at a sampling rate of 500Hz  and reviewed with a high frequency filter of 70Hz  and a low frequency filter of 1Hz . EEG data were recorded continuously and digitally stored. Description: No posterior dominant rhythm was seen. 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 There is also continuous generalized 3 to 6 Hz theta-delta slowing. Hyperventilation and photic stimulation were not performed.   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 The periodic epileptiform discharges are at 1Hz  and at times rhythmic which is on the ictal-interictal continuum with high potential for seizure. There is also moderate diffuse encephalopathy, non specific etiology. No seizures were seen throughout the recording. Priyanka    Assessment/Plan There are no diagnoses linked to this encounter.   Family/ staff Communication:   Labs/tests ordered:

## 2020-12-24 NOTE — Progress Notes (Signed)
This encounter was created in error - please disregard.  This encounter was created in error - please disregard.

## 2021-08-10 ENCOUNTER — Inpatient Hospital Stay (HOSPITAL_COMMUNITY)
Admission: EM | Admit: 2021-08-10 | Discharge: 2021-08-18 | DRG: 871 | Disposition: A | Discharge status: Home / Self Care | Payer: Medicare (Managed Care) | Attending: Internal Medicine | Admitting: Internal Medicine

## 2021-08-10 ENCOUNTER — Inpatient Hospital Stay (HOSPITAL_COMMUNITY): Payer: Medicare (Managed Care)

## 2021-08-10 ENCOUNTER — Encounter (HOSPITAL_COMMUNITY): Payer: Self-pay | Admitting: Critical Care Medicine

## 2021-08-10 ENCOUNTER — Emergency Department (HOSPITAL_COMMUNITY): Payer: Medicare (Managed Care)

## 2021-08-10 ENCOUNTER — Other Ambulatory Visit: Payer: Self-pay

## 2021-08-10 DIAGNOSIS — L89156 Pressure-induced deep tissue damage of sacral region: Secondary | ICD-10-CM | POA: Diagnosis present

## 2021-08-10 DIAGNOSIS — Z4659 Encounter for fitting and adjustment of other gastrointestinal appliance and device: Secondary | ICD-10-CM

## 2021-08-10 DIAGNOSIS — L89223 Pressure ulcer of left hip, stage 3: Secondary | ICD-10-CM | POA: Diagnosis present

## 2021-08-10 DIAGNOSIS — Z87891 Personal history of nicotine dependence: Secondary | ICD-10-CM

## 2021-08-10 DIAGNOSIS — R54 Age-related physical debility: Secondary | ICD-10-CM | POA: Diagnosis present

## 2021-08-10 DIAGNOSIS — F01C0 Vascular dementia, severe, without behavioral disturbance, psychotic disturbance, mood disturbance, and anxiety: Secondary | ICD-10-CM | POA: Diagnosis present

## 2021-08-10 DIAGNOSIS — G40909 Epilepsy, unspecified, not intractable, without status epilepticus: Secondary | ICD-10-CM | POA: Diagnosis present

## 2021-08-10 DIAGNOSIS — I69351 Hemiplegia and hemiparesis following cerebral infarction affecting right dominant side: Secondary | ICD-10-CM

## 2021-08-10 DIAGNOSIS — Z79899 Other long term (current) drug therapy: Secondary | ICD-10-CM

## 2021-08-10 DIAGNOSIS — J189 Pneumonia, unspecified organism: Secondary | ICD-10-CM

## 2021-08-10 DIAGNOSIS — A419 Sepsis, unspecified organism: Secondary | ICD-10-CM | POA: Diagnosis present

## 2021-08-10 DIAGNOSIS — I9589 Other hypotension: Secondary | ICD-10-CM | POA: Diagnosis present

## 2021-08-10 DIAGNOSIS — Z8616 Personal history of COVID-19: Secondary | ICD-10-CM

## 2021-08-10 DIAGNOSIS — Z789 Other specified health status: Secondary | ICD-10-CM | POA: Diagnosis not present

## 2021-08-10 DIAGNOSIS — Z20822 Contact with and (suspected) exposure to covid-19: Secondary | ICD-10-CM | POA: Diagnosis present

## 2021-08-10 DIAGNOSIS — F02C0 Dementia in other diseases classified elsewhere, severe, without behavioral disturbance, psychotic disturbance, mood disturbance, and anxiety: Secondary | ICD-10-CM | POA: Diagnosis present

## 2021-08-10 DIAGNOSIS — D649 Anemia, unspecified: Secondary | ICD-10-CM | POA: Diagnosis present

## 2021-08-10 DIAGNOSIS — D689 Coagulation defect, unspecified: Secondary | ICD-10-CM | POA: Diagnosis present

## 2021-08-10 DIAGNOSIS — R64 Cachexia: Secondary | ICD-10-CM | POA: Diagnosis present

## 2021-08-10 DIAGNOSIS — J9601 Acute respiratory failure with hypoxia: Secondary | ICD-10-CM | POA: Diagnosis present

## 2021-08-10 DIAGNOSIS — J181 Lobar pneumonia, unspecified organism: Secondary | ICD-10-CM | POA: Diagnosis not present

## 2021-08-10 DIAGNOSIS — Z681 Body mass index (BMI) 19 or less, adult: Secondary | ICD-10-CM | POA: Diagnosis not present

## 2021-08-10 DIAGNOSIS — J690 Pneumonitis due to inhalation of food and vomit: Secondary | ICD-10-CM | POA: Diagnosis present

## 2021-08-10 DIAGNOSIS — E43 Unspecified severe protein-calorie malnutrition: Secondary | ICD-10-CM | POA: Diagnosis present

## 2021-08-10 DIAGNOSIS — Z515 Encounter for palliative care: Secondary | ICD-10-CM | POA: Diagnosis not present

## 2021-08-10 DIAGNOSIS — R001 Bradycardia, unspecified: Secondary | ICD-10-CM | POA: Diagnosis present

## 2021-08-10 DIAGNOSIS — Z7189 Other specified counseling: Secondary | ICD-10-CM | POA: Diagnosis not present

## 2021-08-10 DIAGNOSIS — G309 Alzheimer's disease, unspecified: Secondary | ICD-10-CM | POA: Diagnosis present

## 2021-08-10 DIAGNOSIS — L89616 Pressure-induced deep tissue damage of right heel: Secondary | ICD-10-CM | POA: Diagnosis present

## 2021-08-10 DIAGNOSIS — R6521 Severe sepsis with septic shock: Secondary | ICD-10-CM | POA: Diagnosis present

## 2021-08-10 DIAGNOSIS — L89121 Pressure ulcer of left upper back, stage 1: Secondary | ICD-10-CM | POA: Diagnosis present

## 2021-08-10 DIAGNOSIS — Z8701 Personal history of pneumonia (recurrent): Secondary | ICD-10-CM

## 2021-08-10 DIAGNOSIS — L8990 Pressure ulcer of unspecified site, unspecified stage: Secondary | ICD-10-CM | POA: Diagnosis present

## 2021-08-10 DIAGNOSIS — L89522 Pressure ulcer of left ankle, stage 2: Secondary | ICD-10-CM | POA: Diagnosis present

## 2021-08-10 DIAGNOSIS — R6520 Severe sepsis without septic shock: Secondary | ICD-10-CM | POA: Diagnosis not present

## 2021-08-10 DIAGNOSIS — I69320 Aphasia following cerebral infarction: Secondary | ICD-10-CM | POA: Diagnosis not present

## 2021-08-10 DIAGNOSIS — F0280 Dementia in other diseases classified elsewhere without behavioral disturbance: Secondary | ICD-10-CM | POA: Diagnosis present

## 2021-08-10 DIAGNOSIS — F0390 Unspecified dementia without behavioral disturbance: Secondary | ICD-10-CM | POA: Diagnosis not present

## 2021-08-10 DIAGNOSIS — R4182 Altered mental status, unspecified: Secondary | ICD-10-CM | POA: Diagnosis present

## 2021-08-10 DIAGNOSIS — R509 Fever, unspecified: Secondary | ICD-10-CM | POA: Diagnosis not present

## 2021-08-10 DIAGNOSIS — E162 Hypoglycemia, unspecified: Secondary | ICD-10-CM | POA: Diagnosis not present

## 2021-08-10 DIAGNOSIS — Z833 Family history of diabetes mellitus: Secondary | ICD-10-CM

## 2021-08-10 DIAGNOSIS — Z7401 Bed confinement status: Secondary | ICD-10-CM

## 2021-08-10 LAB — CBC WITH DIFFERENTIAL/PLATELET
Abs Immature Granulocytes: 0.04 10*3/uL (ref 0.00–0.07)
Basophils Absolute: 0 10*3/uL (ref 0.0–0.1)
Basophils Relative: 0 %
Eosinophils Absolute: 0 10*3/uL (ref 0.0–0.5)
Eosinophils Relative: 0 %
HCT: 34.5 % — ABNORMAL LOW (ref 36.0–46.0)
Hemoglobin: 11.3 g/dL — ABNORMAL LOW (ref 12.0–15.0)
Immature Granulocytes: 0 %
Lymphocytes Relative: 8 %
Lymphs Abs: 0.9 10*3/uL (ref 0.7–4.0)
MCH: 31.8 pg (ref 26.0–34.0)
MCHC: 32.8 g/dL (ref 30.0–36.0)
MCV: 97.2 fL (ref 80.0–100.0)
Monocytes Absolute: 0.9 10*3/uL (ref 0.1–1.0)
Monocytes Relative: 8 %
Neutro Abs: 9 10*3/uL — ABNORMAL HIGH (ref 1.7–7.7)
Neutrophils Relative %: 84 %
Platelets: 325 10*3/uL (ref 150–400)
RBC: 3.55 MIL/uL — ABNORMAL LOW (ref 3.87–5.11)
RDW: 14.3 % (ref 11.5–15.5)
WBC: 10.8 10*3/uL — ABNORMAL HIGH (ref 4.0–10.5)
nRBC: 0 % (ref 0.0–0.2)

## 2021-08-10 LAB — RESPIRATORY PANEL BY PCR

## 2021-08-10 LAB — URINALYSIS, ROUTINE W REFLEX MICROSCOPIC
Bilirubin Urine: NEGATIVE
Glucose, UA: NEGATIVE mg/dL
Hgb urine dipstick: NEGATIVE
Ketones, ur: 5 mg/dL — AB
Leukocytes,Ua: NEGATIVE
Nitrite: NEGATIVE
Protein, ur: NEGATIVE mg/dL
Specific Gravity, Urine: 1.01 (ref 1.005–1.030)
pH: 8 (ref 5.0–8.0)

## 2021-08-10 LAB — BRAIN NATRIURETIC PEPTIDE: B Natriuretic Peptide: 65.9 pg/mL (ref 0.0–100.0)

## 2021-08-10 LAB — RESP PANEL BY RT-PCR (FLU A&B, COVID) ARPGX2
Influenza A by PCR: NEGATIVE
Influenza B by PCR: NEGATIVE
SARS Coronavirus 2 by RT PCR: NEGATIVE

## 2021-08-10 LAB — COMPREHENSIVE METABOLIC PANEL
ALT: 26 U/L (ref 0–44)
AST: 36 U/L (ref 15–41)
Albumin: 2.5 g/dL — ABNORMAL LOW (ref 3.5–5.0)
Alkaline Phosphatase: 106 U/L (ref 38–126)
Anion gap: 9 (ref 5–15)
BUN: 11 mg/dL (ref 8–23)
CO2: 27 mmol/L (ref 22–32)
Calcium: 8.6 mg/dL — ABNORMAL LOW (ref 8.9–10.3)
Chloride: 100 mmol/L (ref 98–111)
Creatinine, Ser: 0.62 mg/dL (ref 0.44–1.00)
GFR, Estimated: >60 mL/min (ref >60)
Glucose, Bld: 77 mg/dL (ref 70–99)
Potassium: 3.7 mmol/L (ref 3.5–5.1)
Sodium: 136 mmol/L (ref 135–145)
Total Bilirubin: 0.5 mg/dL (ref 0.3–1.2)
Total Protein: 6.7 g/dL (ref 6.5–8.1)

## 2021-08-10 LAB — CORTISOL: Cortisol, Plasma: 34.6 ug/dL

## 2021-08-10 LAB — GLUCOSE, CAPILLARY
Glucose-Capillary: 134 mg/dL — ABNORMAL HIGH (ref 70–99)
Glucose-Capillary: 150 mg/dL — ABNORMAL HIGH (ref 70–99)

## 2021-08-10 LAB — MAGNESIUM: Magnesium: 2.3 mg/dL (ref 1.7–2.4)

## 2021-08-10 LAB — PROTIME-INR
INR: 1.3 — ABNORMAL HIGH (ref 0.8–1.2)
Prothrombin Time: 15.8 seconds — ABNORMAL HIGH (ref 11.4–15.2)

## 2021-08-10 LAB — APTT: aPTT: 39 seconds — ABNORMAL HIGH (ref 24–36)

## 2021-08-10 LAB — LACTIC ACID, PLASMA
Lactic Acid, Venous: 0.8 mmol/L (ref 0.5–1.9)
Lactic Acid, Venous: 1.4 mmol/L (ref 0.5–1.9)

## 2021-08-10 LAB — MRSA NEXT GEN BY PCR, NASAL: MRSA by PCR Next Gen: NOT DETECTED

## 2021-08-10 LAB — PROCALCITONIN: Procalcitonin: 0.26 ng/mL

## 2021-08-10 MED ORDER — LACTATED RINGERS IV BOLUS
1000.0000 mL | Freq: Once | INTRAVENOUS | Status: AC
  Administered 2021-08-10: 1000 mL via INTRAVENOUS

## 2021-08-10 MED ORDER — PANTOPRAZOLE SODIUM 40 MG IV SOLR
40.0000 mg | INTRAVENOUS | Status: DC
  Administered 2021-08-10 – 2021-08-11 (×2): 40 mg via INTRAVENOUS
  Filled 2021-08-10 (×2): qty 40

## 2021-08-10 MED ORDER — FAMOTIDINE IN NACL 20-0.9 MG/50ML-% IV SOLN
20.0000 mg | Freq: Once | INTRAVENOUS | Status: AC
  Administered 2021-08-10: 20 mg via INTRAVENOUS
  Filled 2021-08-10: qty 50

## 2021-08-10 MED ORDER — ORAL CARE MOUTH RINSE
15.0000 mL | Freq: Two times a day (BID) | OROMUCOSAL | Status: DC
  Administered 2021-08-10 – 2021-08-18 (×16): 15 mL via OROMUCOSAL

## 2021-08-10 MED ORDER — LACTATED RINGERS IV SOLN: INTRAVENOUS | Status: DC

## 2021-08-10 MED ORDER — CHLORHEXIDINE GLUCONATE CLOTH 2 % EX PADS
6.0000 | MEDICATED_PAD | Freq: Every day | CUTANEOUS | Status: DC
  Administered 2021-08-10 – 2021-08-18 (×9): 6 via TOPICAL

## 2021-08-10 MED ORDER — MIDODRINE HCL 5 MG PO TABS
5.0000 mg | ORAL_TABLET | Freq: Three times a day (TID) | ORAL | Status: DC
  Administered 2021-08-10: 5 mg via ORAL
  Filled 2021-08-10: qty 1

## 2021-08-10 MED ORDER — LACTATED RINGERS IV BOLUS
500.0000 mL | Freq: Once | INTRAVENOUS | Status: AC
  Administered 2021-08-10: 500 mL via INTRAVENOUS

## 2021-08-10 MED ORDER — ACETAMINOPHEN 650 MG RE SUPP
650.0000 mg | Freq: Once | RECTAL | Status: AC
  Administered 2021-08-10: 650 mg via RECTAL
  Filled 2021-08-10: qty 1

## 2021-08-10 MED ORDER — DOCUSATE SODIUM 100 MG PO CAPS: 100.0000 mg | ORAL_CAPSULE | Freq: Two times a day (BID) | ORAL | Status: DC | PRN

## 2021-08-10 MED ORDER — SODIUM CHLORIDE 0.9 % IV SOLN
500.0000 mg | INTRAVENOUS | Status: AC
  Administered 2021-08-10 – 2021-08-14 (×5): 500 mg via INTRAVENOUS
  Filled 2021-08-10 (×5): qty 500

## 2021-08-10 MED ORDER — SODIUM CHLORIDE 0.9 % IV SOLN
2.0000 g | INTRAVENOUS | Status: AC
  Administered 2021-08-10 – 2021-08-14 (×5): 2 g via INTRAVENOUS
  Filled 2021-08-10 (×5): qty 20

## 2021-08-10 MED ORDER — SODIUM CHLORIDE 0.9 % IV SOLN
250.0000 mL | INTRAVENOUS | Status: DC
  Administered 2021-08-10 – 2021-08-11 (×2): 250 mL via INTRAVENOUS

## 2021-08-10 MED ORDER — NOREPINEPHRINE 4 MG/250ML-% IV SOLN
0.0000 ug/min | INTRAVENOUS | Status: DC
  Administered 2021-08-10: 2 ug/min via INTRAVENOUS
  Filled 2021-08-10: qty 250

## 2021-08-10 MED ORDER — EPINEPHRINE HCL 5 MG/250ML IV SOLN IN NS
0.5000 ug/min | INTRAVENOUS | Status: DC
  Administered 2021-08-10 – 2021-08-11 (×2): 4 ug/min via INTRAVENOUS
  Filled 2021-08-10: qty 250

## 2021-08-10 MED ORDER — ACETAMINOPHEN 325 MG PO TABS: 650.0000 mg | ORAL_TABLET | ORAL | Status: DC | PRN

## 2021-08-10 MED ORDER — HEPARIN SODIUM (PORCINE) 5000 UNIT/ML IJ SOLN
5000.0000 [IU] | Freq: Three times a day (TID) | INTRAMUSCULAR | Status: DC
  Administered 2021-08-10 – 2021-08-18 (×24): 5000 [IU] via SUBCUTANEOUS
  Filled 2021-08-10 (×24): qty 1

## 2021-08-10 MED ORDER — POLYETHYLENE GLYCOL 3350 17 G PO PACK: 17.0000 g | PACK | Freq: Every day | ORAL | Status: DC | PRN

## 2021-08-10 MED ORDER — MIDODRINE HCL 5 MG PO TABS
5.0000 mg | ORAL_TABLET | Freq: Three times a day (TID) | ORAL | Status: DC
  Administered 2021-08-11 – 2021-08-13 (×7): 5 mg
  Filled 2021-08-10 (×7): qty 1

## 2021-08-10 MED ORDER — LEVETIRACETAM 500 MG PO TABS: 500.0000 mg | ORAL_TABLET | Freq: Once | ORAL | Status: DC

## 2021-08-10 MED ORDER — ONDANSETRON HCL 4 MG/2ML IJ SOLN: 4.0000 mg | Freq: Four times a day (QID) | INTRAMUSCULAR | Status: DC | PRN

## 2021-08-10 NOTE — Progress Notes (Signed)
Received VAST consult r/t placing US guided PIV for Vasopressors. Started US guided PIV 20g1.88" angiocath in right posterior forearm x1 attempt.GBR,flushed well with 20ccNS. Marked tip of cath. Notified primary RN Deberah Pelton to observe with IV watch for possible infiltration.

## 2021-08-10 NOTE — Progress Notes (Signed)
eLink Physician-Brief Progress Note Patient Name: Janet Gutierrez DOB: 18-Oct-1945 MRN: [id number]   Date of Service  08/10/2021  HPI/Events of Note  Blood for procalcitonin hemolyzed  eICU Interventions  Re-ordered     Intervention Category Minor Interventions: Other:  Darl Pikes 08/10/2021, 8:20 PM

## 2021-08-10 NOTE — H&P (Signed)
NAME:  Janet Gutierrez, MRN:  [id number], DOB:  Oct 19, 1945, LOS: 0 ADMISSION DATE:  08/10/2021, CONSULTATION DATE:  08/10/21 REFERRING MD:  Johnney Killian- TRH, CHIEF COMPLAINT:  septic shock   History of Present Illness:  Janet Gutierrez is a 75 y/o woman with a history of dementia who is nonverbal at baseline who presented from respite care with hypoxia.  She was moved to [facility] yesterday, and her baseline was unknown. She has had aspiration pneumonias in the past, most recently in January 2022.  With EMS she was febrile to 102 and hypotensive. She has lived at home with family until recently and they report that although she is nonverbal, she has a good quality of life. In the ED CXR demonstrated RUL infiltrate. She received ceftriaxone and azithromycin after blood cultures were drawn and was started on NE for persistent hypotension. She is on midodrine at baseline.   Pertinent  Medical History  Severe dementia CVA   Significant Hospital Events: Including procedures, antibiotic start and stop dates in addition to other pertinent events   11/12 admission  Interim History / Subjective:    Objective   Blood pressure 101/62, pulse 67, temperature 98.1 F (36.7 C), temperature source Rectal, resp. rate 12, SpO2 100 %.        Intake/Output Summary (Last 24 hours) at 08/10/2021 1523 Last data filed at 08/10/2021 1455 Gross per 24 hour  Intake 2900 ml  Output --  Net 2900 ml   There were no vitals filed for this visit.  Examination: General: frail, chronically ill appearing woman lying in bed in NAD HENT: Janet Gutierrez/AT, eyes anicteric, dry mucous membranes Lungs: rhales on R, no rhonchi, no tachypnea or accessory muscle use Cardiovascular: S1S2, RRR Abdomen: soft, NT Extremities: no c/c/e Neuro: confused, minimally responsive to stimulation, not sure if contracted or resisting elbow extension Derm: warm, dry, no rashes  CXR personally reviewed> RUL infiltrate LA 0.8>1.4 EKG sinus  bradycardia WBC 10.8 H/H 11.3/34.5 Covid, flu negative   Resolved Hospital Problem list     Assessment & Plan:  Septic shock due to RUL CAP -additional 1L IVF -con't ceftriaxone and azithromycin -check RVP -collect sputum culture if able; follow blood cultures until finalized -vasopressors as required to maintain MAP >65; switching from NE to epi due to bradycardia  Chronic hypotension -con't PTA midodrine; will needs NGT most likely  Chronic anemia, likely hemoconcentrated  -transfuse for Hb<7 or hemodynamically significant bleeding -monitor  Coagulopathy due to sepsis, mild -monitor  moderate protein energy malnutrition -will need NGT if not able to swallow  History of severe dementia Family wishes for patient to remain full code. Anticipate she would not do well if she progresses to MOF. Palliative care consult is appropriate for long-term goals of care planning.  Best Practice (right click and "Reselect all SmartList Selections" daily)   Diet/type: NPO- pending swallow evaluation DVT prophylaxis: prophylactic heparin  GI prophylaxis: PPI Lines: N/A Foley:  N/A Code Status:  full code Last date of multidisciplinary goals of care discussion [ family wants full code on 11/12]  Labs   CBC: Recent Labs  Lab 08/10/21 0909  WBC 10.8*  NEUTROABS 9.0*  HGB 11.3*  HCT 34.5*  MCV 97.2  PLT [id number]    Basic Metabolic Panel: Recent Labs  Lab 08/10/21 0909  NA 136  K 3.7  CL 100  CO2 27  GLUCOSE 77  BUN 11  CREATININE 0.62  CALCIUM 8.6*   GFR: CrCl cannot be calculated (Unknown ideal weight.).  Recent Labs  Lab 08/10/21 0909 08/10/21 1101  WBC 10.8*  --   LATICACIDVEN 0.8 1.4    Liver Function Tests: Recent Labs  Lab 08/10/21 0909  AST 36  ALT 26  ALKPHOS 106  BILITOT 0.5  PROT 6.7  ALBUMIN 2.5*   No results for input(s): LIPASE, AMYLASE in the last 168 hours. No results for input(s): AMMONIA in the last 168 hours.  ABG    Component  Value Date/Time   PHART 7.522 (H) 10/14/2020 1615   PCO2ART 31.9 (L) 10/14/2020 1615   PO2ART 55 (L) 10/14/2020 1615   HCO3 26.4 10/14/2020 1615   TCO2 27 10/14/2020 1615   O2SAT 93.0 10/14/2020 1615     Coagulation Profile: Recent Labs  Lab 08/10/21 0909  INR 1.3*    Cardiac Enzymes: No results for input(s): CKTOTAL, CKMB, CKMBINDEX, TROPONINI in the last 168 hours.  HbA1C: No results found for: HGBA1C  CBG: No results for input(s): GLUCAP in the last 168 hours.  Review of Systems:   Unable to obtain due to lack of ability to speak  Past Medical History:  She,  has a past medical history of CVA (cerebral vascular accident) (HCC) and Dementia (HCC).   Surgical History:   Past Surgical History:  Procedure Laterality Date   BRAIN SURGERY     age 4     Social History:   reports that she quit smoking about 11 years ago. She has never used smokeless tobacco. She reports that she does not drink alcohol and does not use drugs.   Family History:  Her family history includes Diabetes in her mother.   Allergies No Known Allergies   Home Medications  Prior to Admission medications   Medication Sig Start Date End Date Taking? Authorizing Provider  famotidine (PEPCID) 10 MG tablet Take 10 mg by mouth daily.   Yes [provider]  levETIRAcetam (KEPPRA) 500 MG tablet Take 1 tablet (500 mg total) by mouth 2 (two) times daily. 05/04/17  Yes Ginger Carne, MD  midodrine (PROAMATINE) 5 MG tablet Take 1 tablet (5 mg total) by mouth 3 (three) times daily with meals. 11/03/20  Yes Christian, Rylee, MD  Multiple Vitamin (MULTIVITAMIN WITH MINERALS) TABS tablet Take 1 tablet by mouth daily.   Yes [provider]  Nutritional Supplements (ENSURE ENLIVE PO) Take 8 fluid ounces by mouth 3 (three) times daily.   Yes [provider]  sennosides-docusate sodium (SENOKOT-S) 8.6-50 MG tablet Take 1 tablet by mouth in the morning and at bedtime.   Yes [provider]     Critical care time: 40 min.    Steffanie Dunn, DO 08/10/21 3:53 PM [facility]

## 2021-08-10 NOTE — ED Provider Notes (Addendum)
[facility] EMERGENCY DEPARTMENT Provider Note   CSN: [id number] Arrival date & time: 08/10/21  4010     History Chief Complaint  Patient presents with   Hypotension    Janet Gutierrez is a 75 y.o. female.  HPI Patient is brought from [facility].  Reportedly she arrived for his first time yesterday evening for a respite care.  The patient's medical history per review of EMR is for fairly advanced and severe dementia with complication of fairly frequent aspiration pneumonia.  Patient was last discharged in the hospital [phone] for presentation of hypoxic respiratory failure and sepsis.  At that time prognosis was very guarded for future medical illness.  At the SNF this morning, the staff was not familiar with the patient but she was noted to be febrile and hypotensive with poorly responsive mental status.  Oxygen saturation at SNF was reported to be 86% on room air.  She was placed on 4 L by EMS and at 97%.  EMS identified blood pressure of 90/50 and gave 500 cc normal bolus per sepsis protocol.  EMS was called.  Patient cannot give any history.  All history is from EMR and EMS.    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, now largely resolved   Dementia [facility])     Patient Active Problem List   Diagnosis Date Noted   Goals of care, counseling/discussion    Aspiration into lower respiratory tract    Protein-calorie malnutrition, severe 10/24/2020   Pressure injury of skin 10/19/2020   Severe sepsis (HCC) 10/13/2020   COVID-19 virus infection 10/13/2020   Pneumonia 10/13/2020   Dysphagia 10/13/2020   Spell of altered consciousness    Seizures (HCC)    Altered mental status 05/01/2017   Alzheimer's disease (HCC) 11/30/2013    Past Surgical History:  Procedure Laterality Date   BRAIN SURGERY     age 27     OB History   No obstetric history on file.     Family  History  Problem Relation Age of Onset   Diabetes Mother     Social History   Tobacco Use   Smoking status: Former    Types: Cigarettes    Quit date: 08/29/2009    Years since quitting: 11.9   Smokeless tobacco: Never  Substance Use Topics   Alcohol use: No   Drug use: No    Home Medications Prior to Admission medications   Medication Sig Start Date End Date Taking? Authorizing Provider  famotidine (PEPCID) 10 MG tablet Take 10 mg by mouth daily.   Yes [provider]  levETIRAcetam (KEPPRA) 500 MG tablet Take 1 tablet (500 mg total) by mouth 2 (two) times daily. 05/04/17  Yes Ginger Carne, MD  midodrine (PROAMATINE) 5 MG tablet Take 1 tablet (5 mg total) by mouth 3 (three) times daily with meals. 11/03/20  Yes Christian, Rylee, MD  Multiple Vitamin (MULTIVITAMIN WITH MINERALS) TABS tablet Take 1 tablet by mouth daily.   Yes [provider]  Nutritional Supplements (ENSURE ENLIVE PO) Take 8 fluid ounces by mouth 3 (three) times daily.   Yes [provider]  sennosides-docusate sodium (SENOKOT-S) 8.6-50 MG tablet Take 1 tablet by mouth in the morning and at bedtime.   Yes [provider]    Allergies    Patient has no known allergies.  Review of Systems   Review of Systems  Level 5 caveat cannot obtain review of systems due to dementia. Physical Exam Updated Vital Signs BP (!) 80/51   Pulse (!) 56   Temp 98.1 F (36.7 C) (Rectal)   Resp 13   SpO2 100%   Physical Exam Constitutional:      Comments: Patient does not exhibit any respiratory distress.  She is resting with eyes closed.  She does not interact.  She does object or moan with certain repositioning in examination  HENT:     Mouth/Throat:     Pharynx: Oropharynx is clear.  Cardiovascular:     Rate and Rhythm: Normal rate and regular rhythm.  Pulmonary:     Effort: Pulmonary effort is normal.     Breath sounds: Normal breath sounds.     Comments: Breath sounds are grossly  clear.  She is not cooperating with specific inhalation exhalation.  Right seems diminished as compared to left. Abdominal:     General: There is no distension.     Palpations: Abdomen is soft.     Tenderness: There is no abdominal tenderness.  Musculoskeletal:     Comments: Extremities are fairly cachectic.  No significant edema.  Patient does have pressure wounds on the sacrum and on the left hip.  See attached images  Skin:    General: Skin is warm and dry.  Neurological:     Comments: At baseline, patient has known advanced dementia.  She does not make any meaningful communication.  She does not follow any commands.  She is not reaching or spontaneously moving extremities.  She does moan and stiffen or resist somewhat to position changes         ED Results / Procedures / Treatments   Labs (all labs ordered are listed, but only abnormal results are displayed) Labs Reviewed  COMPREHENSIVE METABOLIC PANEL - Abnormal; Notable for the following components:      Result Value   Calcium 8.6 (*)    Albumin 2.5 (*)    All other components within normal limits  CBC WITH DIFFERENTIAL/PLATELET - Abnormal; Notable for the following components:   WBC 10.8 (*)    RBC 3.55 (*)    Hemoglobin 11.3 (*)    HCT 34.5 (*)    Neutro Abs 9.0 (*)    All other components within normal limits  PROTIME-INR - Abnormal; Notable for the following components:   Prothrombin Time 15.8 (*)    INR 1.3 (*)    All other components within normal limits  APTT - Abnormal; Notable for the following components:   aPTT 39 (*)    All other components within normal limits  RESP PANEL BY RT-PCR (FLU A&B, COVID) ARPGX2  CULTURE, BLOOD (ROUTINE X 2)  CULTURE, BLOOD (ROUTINE X 2)  URINE CULTURE  LACTIC ACID, PLASMA  LACTIC ACID, PLASMA  URINALYSIS, ROUTINE W REFLEX MICROSCOPIC  LEVETIRACETAM LEVEL    EKG EKG Interpretation  Date/Time:  Saturday August 10 2021 08:58:20 EST Ventricular Rate:  87 PR  Interval:  156 QRS Duration: 78 QT Interval:  350 QTC Calculation: 421 R Axis:   173 Text Interpretation: Sinus rhythm Right axis deviation Low voltage, extremity leads rate increased otherwise no sig change from previous Confirmed by Charlesetta Shanks [phone]) on 08/10/2021 12:12:50 PM  Radiology DG Chest Port 1 View  Result Date: 08/10/2021 CLINICAL DATA:  Questionable sepsis - evaluate for abnormality EXAM: PORTABLE CHEST 1 VIEW COMPARISON:  Radiograph 10/16/2020 FINDINGS: Note that the images mislabeled right versus left, as is evidenced  by anatomy on multiple prior exams. There is a right upper lobe consolidation. There is no large pleural effusion or visible pneumothorax. There is no acute osseous abnormality. IMPRESSION: Right upper lobe pneumonia. Electronically Signed   By: Maurine Simmering M.D.   On: 08/10/2021 09:45    Procedures Procedures  CRITICAL CARE Performed by: Charlesetta Shanks   Total critical care time: 30 minutes  Critical care time was exclusive of separately billable procedures and treating other patients.  Critical care was necessary to treat or prevent imminent or life-threatening deterioration.  Critical care was time spent personally by me on the following activities: development of treatment plan with patient and/or surrogate as well as nursing, discussions with consultants, evaluation of patient's response to treatment, examination of patient, obtaining history from patient or surrogate, ordering and performing treatments and interventions, ordering and review of laboratory studies, ordering and review of radiographic studies, pulse oximetry and re-evaluation of patient's condition.  Medications Ordered in ED Medications  lactated ringers infusion (0 mLs Intravenous Stopped 08/10/21 1136)  lactated ringers infusion ( Intravenous New Bag/Given 08/10/21 1146)  cefTRIAXone (ROCEPHIN) 2 g in sodium chloride 0.9 % 100 mL IVPB (0 g Intravenous Stopped 08/10/21 1238)   azithromycin (ZITHROMAX) 500 mg in sodium chloride 0.9 % 250 mL IVPB (0 mg Intravenous Stopped 08/10/21 1346)  midodrine (PROAMATINE) tablet 5 mg (has no administration in time range)  famotidine (PEPCID) IVPB 20 mg premix (20 mg Intravenous New Bag/Given 08/10/21 1352)  levETIRAcetam (KEPPRA) tablet 500 mg (500 mg Oral Not Given 08/10/21 1352)  norepinephrine (LEVOPHED) 4mg  in 237mL premix infusion (6 mcg/min Intravenous Rate/Dose Change 08/10/21 1404)  acetaminophen (TYLENOL) suppository 650 mg (650 mg Rectal Given 08/10/21 0925)  lactated ringers bolus 500 mL (0 mLs Intravenous Stopped 08/10/21 1005)  lactated ringers bolus 500 mL (0 mLs Intravenous Stopped 08/10/21 1048)  lactated ringers bolus 1,000 mL (0 mLs Intravenous Stopped 08/10/21 1238)    ED Course  I have reviewed the triage vital signs and the nursing notes.  Pertinent labs & imaging results that were available during my care of the patient were reviewed by me and considered in my medical decision making (see chart for details).  Clinical Course as of 08/10/21 1415  Sat Aug 10, 2021  1415 Consult: Reviewed ICU PA-C Judson Roch gross for admission for admission [MP]    Clinical Course User Index [MP] Charlesetta Shanks, MD   MDM Rules/Calculators/A&P                           Patient has known high risk for sepsis and aspiration.  She has significant dementia and cannot participate in history.  Patient is febrile to 100.7 on rectal temperature in the emergency department.  Patient does have history of chronic hypotension and per last discharge summary there was the suggestion of using midodrine regularly.  Patient is somewhat hypotensive has had soft blood pressures in the 80s and 90s.  Fluid resuscitation is underway.  She is responding to fluid boluses and pressures are rising to the 90s.  She does trend back down.  We will complete bolus and start antibiotics for pneumonia based on chest x-ray findings.  Plan for admission.  At  this time patient is on supplemental oxygen but is not showing signs of pending respiratory failure requiring intubation or BiPAP.  Recheck: 13: 10 patient appears comfortable.  She is not exhibiting respiratory distress.  Her eyes are open.  She does not  interact to verbal questions.  Heart rate is stable in the 70s and regular narrow complex.  Respirations are nonlabored.  Patient has completed fluid bolus of 2500 mL including 500 that was given by EMS.  Blood pressures remain soft trending around 80s and 90s.  I had a conversation with the patient's son on the phone.  He advised that she does remain full code.  He advises her baseline is not to be verbally interactive at all.  He reports she will somewhat respond to her name is Suzana but not any other part of her name.  He reports he and his wife of cared for her in their home and she was just in respite care for the weekend.  He reports that they give her her medications by sitting her up and placing them in her mouth and then she takes them.  He advises she seems to do well with that.  He reports aside from being nonverbal, she generally seems to do well physically under their care at home.   Treatment has been initiated for sepsis.  Chest x-ray does identify an area of suspected pneumonia.  We will consult intensivist for soft blood pressures after fluid resuscitation.  Will add home medications of Keppra and midodrine orally as patient's son reports that she does take these without significant difficulty.  Recheck: Pressures trended back down to 79 systolic.  Heart rate remains stable.  No respiratory distress.  Will initiate Levophed for persistent hypotension after fluid resuscitation with suspected sepsis. Final Clinical Impression(s) / ED Diagnoses Final diagnoses:  Sepsis, due to unspecified organism, unspecified whether acute organ dysfunction present ([place])  Dementia, unspecified dementia severity, unspecified dementia type, unspecified  whether behavioral, psychotic, or mood disturbance or anxiety [facility])    Rx / DC Orders ED Discharge Orders     None        Charlesetta Shanks, MD 08/10/21 1324    Charlesetta Shanks, MD 08/10/21 1337    Charlesetta Shanks, MD 08/10/21 1416

## 2021-08-10 NOTE — ED Triage Notes (Signed)
Patient arrived from [place], new resident for respite maybe long term care, unable to establish patients baseline. Presents w/ increased hr, low Sp02 (86% on RA) improved to 97@ 4L via [place], 24 rr, febrile. 500 NS bolus given PTA, initial BP 90/50 w/ known hx of hypotension. CBG 109.

## 2021-08-10 NOTE — Progress Notes (Signed)
Elink following for sepsis protocol. 

## 2021-08-11 ENCOUNTER — Inpatient Hospital Stay (HOSPITAL_COMMUNITY): Payer: Medicare (Managed Care)

## 2021-08-11 ENCOUNTER — Inpatient Hospital Stay: Payer: Self-pay

## 2021-08-11 DIAGNOSIS — Z7189 Other specified counseling: Secondary | ICD-10-CM

## 2021-08-11 DIAGNOSIS — R509 Fever, unspecified: Secondary | ICD-10-CM

## 2021-08-11 DIAGNOSIS — E43 Unspecified severe protein-calorie malnutrition: Secondary | ICD-10-CM

## 2021-08-11 DIAGNOSIS — A419 Sepsis, unspecified organism: Secondary | ICD-10-CM | POA: Diagnosis not present

## 2021-08-11 DIAGNOSIS — R6520 Severe sepsis without septic shock: Secondary | ICD-10-CM | POA: Diagnosis not present

## 2021-08-11 DIAGNOSIS — F0390 Unspecified dementia without behavioral disturbance: Secondary | ICD-10-CM

## 2021-08-11 DIAGNOSIS — Z789 Other specified health status: Secondary | ICD-10-CM

## 2021-08-11 DIAGNOSIS — Z515 Encounter for palliative care: Secondary | ICD-10-CM

## 2021-08-11 LAB — BASIC METABOLIC PANEL
Anion gap: 11 (ref 5–15)
BUN: 9 mg/dL (ref 8–23)
CO2: 24 mmol/L (ref 22–32)
Calcium: 8.1 mg/dL — ABNORMAL LOW (ref 8.9–10.3)
Chloride: 101 mmol/L (ref 98–111)
Creatinine, Ser: 0.41 mg/dL — ABNORMAL LOW (ref 0.44–1.00)
GFR, Estimated: >60 mL/min (ref >60)
Glucose, Bld: 61 mg/dL — ABNORMAL LOW (ref 70–99)
Potassium: 3.4 mmol/L — ABNORMAL LOW (ref 3.5–5.1)
Sodium: 136 mmol/L (ref 135–145)

## 2021-08-11 LAB — ECHOCARDIOGRAM COMPLETE
Area-P 1/2: 3.23 cm2
S' Lateral: 2.75 cm
Weight: 1795.43 oz

## 2021-08-11 LAB — GLUCOSE, CAPILLARY
Glucose-Capillary: 33 mg/dL — CL (ref 70–99)
Glucose-Capillary: 89 mg/dL (ref 70–99)
Glucose-Capillary: 93 mg/dL (ref 70–99)
Glucose-Capillary: 85 mg/dL (ref 70–99)
Glucose-Capillary: 120 mg/dL — ABNORMAL HIGH (ref 70–99)
Glucose-Capillary: 233 mg/dL — ABNORMAL HIGH (ref 70–99)
Glucose-Capillary: 146 mg/dL — ABNORMAL HIGH (ref 70–99)
Glucose-Capillary: 109 mg/dL — ABNORMAL HIGH (ref 70–99)

## 2021-08-11 LAB — CBC
HCT: 31.5 % — ABNORMAL LOW (ref 36.0–46.0)
Hemoglobin: 10.2 g/dL — ABNORMAL LOW (ref 12.0–15.0)
MCH: 31.8 pg (ref 26.0–34.0)
MCHC: 32.4 g/dL (ref 30.0–36.0)
MCV: 98.1 fL (ref 80.0–100.0)
Platelets: 254 10*3/uL (ref 150–400)
RBC: 3.21 MIL/uL — ABNORMAL LOW (ref 3.87–5.11)
RDW: 14.3 % (ref 11.5–15.5)
WBC: 11.5 10*3/uL — ABNORMAL HIGH (ref 4.0–10.5)
nRBC: 0 % (ref 0.0–0.2)

## 2021-08-11 LAB — PHOSPHORUS
Phosphorus: 2.8 mg/dL (ref 2.5–4.6)
Phosphorus: 2.1 mg/dL — ABNORMAL LOW (ref 2.5–4.6)
Phosphorus: 2.1 mg/dL — ABNORMAL LOW (ref 2.5–4.6)

## 2021-08-11 LAB — URINE CULTURE

## 2021-08-11 LAB — MAGNESIUM: Magnesium: 1.7 mg/dL (ref 1.7–2.4)

## 2021-08-11 LAB — STREP PNEUMONIAE URINARY ANTIGEN: Strep Pneumo Urinary Antigen: NEGATIVE

## 2021-08-11 MED ORDER — LEVETIRACETAM IN NACL 500 MG/100ML IV SOLN: 500.0000 mg | INTRAVENOUS | Status: DC

## 2021-08-11 MED ORDER — LEVETIRACETAM IN NACL 500 MG/100ML IV SOLN
500.0000 mg | Freq: Two times a day (BID) | INTRAVENOUS | Status: DC
  Administered 2021-08-11 – 2021-08-12 (×4): 500 mg via INTRAVENOUS
  Filled 2021-08-11 (×4): qty 100

## 2021-08-11 MED ORDER — PROSOURCE TF PO LIQD
45.0000 mL | Freq: Two times a day (BID) | ORAL | Status: DC
  Administered 2021-08-11 – 2021-08-12 (×3): 45 mL
  Filled 2021-08-11 (×2): qty 45

## 2021-08-11 MED ORDER — DEXTROSE 50 % IV SOLN: 25.0000 g | INTRAVENOUS | Status: AC

## 2021-08-11 MED ORDER — MAGNESIUM SULFATE 2 GM/50ML IV SOLN
2.0000 g | Freq: Once | INTRAVENOUS | Status: AC
  Administered 2021-08-11: 2 g via INTRAVENOUS
  Filled 2021-08-11: qty 50

## 2021-08-11 MED ORDER — DEXTROSE 50 % IV SOLN
INTRAVENOUS | Status: AC
  Administered 2021-08-11: 50 mL
  Filled 2021-08-11: qty 50

## 2021-08-11 MED ORDER — VITAL HIGH PROTEIN PO LIQD
1000.0000 mL | ORAL | Status: DC
  Administered 2021-08-11: 1000 mL

## 2021-08-11 MED ORDER — POTASSIUM CHLORIDE 10 MEQ/100ML IV SOLN
10.0000 meq | INTRAVENOUS | Status: AC
  Administered 2021-08-11 (×4): 10 meq via INTRAVENOUS
  Filled 2021-08-11 (×4): qty 100

## 2021-08-11 MED ORDER — DEXTROSE IN LACTATED RINGERS 5 % IV SOLN: INTRAVENOUS | Status: DC

## 2021-08-11 NOTE — Progress Notes (Signed)
eLink Physician-Brief Progress Note Patient Name: Janet Gutierrez DOB: 1946/05/27 MRN: [id number]   Date of Service  08/11/2021  HPI/Events of Note  Notified of hypoglycemia 30s  eICU Interventions  Fluids switched to D5 LR and CBG q 4 ordered     Intervention Category Intermediate Interventions: Other:  Darl Pikes 08/11/2021, 3:41 AM

## 2021-08-11 NOTE — Progress Notes (Signed)
[place] with Phyllis Ginger, secure chat sent to Kandice Robinsons NP re PICC order.  Pt is too contracted for bedside PICC placement. Unable to abduct BUE.  Dr Isaiah Serge added to secure chat.  Recommendations are to place a CVC or IR placed PICC.

## 2021-08-11 NOTE — Consult Note (Signed)
Consultation Note Date: 08/11/2021   Patient Name: Janet Gutierrez  DOB: 04-Oct-1945  MRN: [id number]  Age / Sex: 75 y.o., female  PCP: Patient, No Pcp Per (Inactive) Referring Physician: Chilton Greathouse, MD  Reason for Consultation: Establishing goals of care  HPI/Patient Profile: 75 y.o. female  with past medical history of CVA and dementia who is non-verbal at baseline presented to the ED from [facility] where she was staying for respite care while family traveled with hypoxia. Patient was admitted on 08/10/2021 with septic shock due to RUL CAP and moderate protein calorie malnutrition.   Of note, patient and family have been seen by PMT several times during previous hospitalizations where son was clear for full code/full scope.  Clinical Assessment and Goals of Care: I have reviewed medical records including EPIC notes, labs, and imaging. Received report from primary RN - no acute concerns. Patient is current on epi and receiving tube feeds.    Went to visit patient at bedside - no family/visitors present. Patient was lying in bed - she does not wake to voice/gentle touch. No signs or non-verbal gestures of pain or discomfort noted. No respiratory distress, increased work of breathing, or secretions noted. Bair hugger in use. She is chronically ill and frail appearing.  Attempted to call son/Quinton to discuss diagnosis, prognosis, GOC, EOL wishes, disposition, and options.  I re-introduced Palliative Medicine as specialized medical care for people living with serious illness. It focuses on providing relief from the symptoms and stress of a serious illness. The goal is to improve quality of life for both the patient and the family.  Attempted to review interval history since patient's last admission earlier this year. Mena Goes is currently out of town, flying back tonight. His cell service was very bad and  frequently cutting in/out making meaningful conversation not possible. I was able to provide medical updates per his request to the best of my ability.   I was able to discern to continue full code/full scope for now with goal to get patient back home with Quinton at discharge. Mena Goes stated things have been going "great" at home since her last admission despite her poor prognosis. Mena Goes is agreeable to follow up phone call tomorrow once he's back in town with better service.   Questions and concerns were addressed. The patient/family was encouraged to call with questions and/or concerns. PMT card was provided.  Primary Decision Maker: NEXT OF KIN - seems to be son/Quinton - can verify tomorrow    SUMMARY OF RECOMMENDATIONS   Continue full code/full scope Son is out of town with bad cell service - was not able to have meaningful GOC today. Will follow up tomorrow 11/14 when he's back in town Patient and family have been seen multiple times by PMT in the past and indicated they would want full code/full scope PMT will continue to follow and support holistically   Code Status/Advance Care Planning: Full code   Palliative Prophylaxis:  Aspiration, Bowel Regimen, Delirium Protocol, Frequent Pain Assessment, Oral Care, and  Turn Reposition  Additional Recommendations (Limitations, Scope, Preferences): Full Scope Treatment  Psycho-social/Spiritual:  Created space and opportunity for patient and family to express thoughts and feelings regarding patient's current medical situation.  Emotional support and therapeutic listening provided.  Prognosis:  Poor in the setting of advanced age and end stage dementia  Discharge Planning: To Be Determined      Primary Diagnoses: Present on Admission:  Alzheimer's disease (HCC)  Pressure injury of skin  Protein-calorie malnutrition, severe  Severe sepsis (HCC)  Altered mental status   I have reviewed the medical record, interviewed the  patient and family, and examined the patient. The following aspects are pertinent.  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, now largely resolved   Dementia (HCC)    Social History   Socioeconomic History   Marital status: Single    Spouse name: Not on file   Number of children: Not on file   Years of education: Not on file   Highest education level: Not on file  Occupational History   Not on file  Tobacco Use   Smoking status: Former    Types: Cigarettes    Quit date: 08/29/2009    Years since quitting: 11.9   Smokeless tobacco: Never  Substance and Sexual Activity   Alcohol use: No   Drug use: No   Sexual activity: Not on file  Other Topics Concern   Not on file  Social History Narrative   Not on file   Social Determinants of Health   Financial Resource Strain: Not on file  Food Insecurity: Not on file  Transportation Needs: Not on file  Physical Activity: Not on file  Stress: Not on file  Social Connections: Not on file   Family History  Problem Relation Age of Onset   Diabetes Mother    Scheduled Meds:  Chlorhexidine Gluconate Cloth  6 each Topical Daily   feeding supplement (PROSource TF)  45 mL Per Tube BID   feeding supplement (VITAL HIGH PROTEIN)  1,000 mL Per Tube Q24H   heparin  5,000 Units Subcutaneous Q8H   mouth rinse  15 mL Mouth Rinse BID   midodrine  5 mg Per Tube TID WC   pantoprazole (PROTONIX) IV  40 mg Intravenous Q24H   Continuous Infusions:  sodium chloride Stopped (08/11/21 0453)   azithromycin Stopped (08/11/21 1331)   cefTRIAXone (ROCEPHIN)  IV Stopped (08/11/21 1052)   epinephrine 4 mcg/min (08/11/21 1754)   levETIRAcetam Stopped (08/11/21 1438)   PRN Meds:.acetaminophen, docusate sodium, ondansetron (ZOFRAN) IV, polyethylene glycol Medications Prior to Admission:  Prior to Admission medications   Medication Sig Start Date End Date Taking?  Authorizing Provider  famotidine (PEPCID) 10 MG tablet Take 10 mg by mouth daily.   Yes [provider]  levETIRAcetam (KEPPRA) 500 MG tablet Take 1 tablet (500 mg total) by mouth 2 (two) times daily. 05/04/17  Yes Ginger Carne, MD  midodrine (PROAMATINE) 5 MG tablet Take 1 tablet (5 mg total) by mouth 3 (three) times daily with meals. 11/03/20  Yes Christian, Rylee, MD  Multiple Vitamin (MULTIVITAMIN WITH MINERALS) TABS tablet Take 1 tablet by mouth daily.   Yes [provider]  Nutritional Supplements (ENSURE ENLIVE PO) Take 8 fluid ounces by mouth 3 (three) times daily.   Yes [provider]  sennosides-docusate sodium (SENOKOT-S) 8.6-50 MG tablet Take 1 tablet by mouth in the morning and at  bedtime.   Yes [provider]   No Known Allergies Review of Systems  Unable to perform ROS: Dementia   Physical Exam Vitals and nursing note reviewed.  Constitutional:      General: She is not in acute distress.    Appearance: She is cachectic. She is ill-appearing.  Pulmonary:     Effort: No respiratory distress.  Skin:    General: Skin is warm and dry.  Neurological:     Mental Status: She is unresponsive.     Motor: Weakness present.  Psychiatric:        Speech: She is noncommunicative.    Vital Signs: BP (!) 109/52   Pulse (!) 58   Temp (!) 93.4 F (34.1 C) Comment: RN applied bear huggar  Resp 12   Wt 50.9 kg   SpO2 99%   BMI 17.58 kg/m  Pain Scale: CPOT   Pain Score: 0-No pain   SpO2: SpO2: 99 % O2 Device:SpO2: 99 % O2 Flow Rate: .O2 Flow Rate (L/min): 3 L/min  IO: Intake/output summary:  Intake/Output Summary (Last 24 hours) at 08/11/2021 1857 Last data filed at 08/11/2021 1741 Gross per 24 hour  Intake 4710.78 ml  Output 1500 ml  Net 3210.78 ml    LBM: Last BM Date:  (PTA) Baseline Weight: Weight: 50.9 kg Most recent weight: Weight: 50.9 kg     Palliative Assessment/Data: PPS 30% with tube feeds     Time In: 1830 Time  Out: 1905 Time Total: 35 minutes  Greater than 50%  of this time was spent counseling and coordinating care related to the above assessment and plan.  Signed by: Haskel Khan, NP   Please contact Palliative Medicine Team phone at [phone] for questions and concerns.  For individual provider: See Loretha Stapler

## 2021-08-11 NOTE — Progress Notes (Signed)
NAME:  Janet Gutierrez, MRN:  [id number], DOB:  1946/09/14, LOS: 1 ADMISSION DATE:  08/10/2021, CONSULTATION DATE:  08/10/2021 REFERRING MD:  Pfeiffer-TRH, CHIEF COMPLAINT:  Septic shock   History of Present Illness:  Janet Gutierrez is a 75 y.o. F who presented from [facility] after being found to have fever, hypotensive, and poorly responsive mental status. O2 sats were reported to be 86% on room air at that time. EMS was called and on initial evaluation she was found to be hypotensive to 90/50 and placed on 4L [place] which improved O2 sats to 97%.   On arrival to ED she received fluid resuscitation of 2.5L with minimal response in BP. Chest x-ray showed suspected pneumonia and sepsis treatment was initiated. She was transferred to ICU for hypotension minimally responsive to fluid resuscitation requiring pressors and baseline midodrine.  Pertinent  Medical History  Severe dementia, CVA  Significant Hospital Events: Including procedures, antibiotic start and stop dates in addition to other pertinent events   08/10/2021 Admitted MICU  Interim History / Subjective:  Patient was on norepi which was transitioned to epi due to bradycardia. When epi is held she returns to a bradycardic state, down to low 40's, however this does improve with resumption of epi.   Objective   Blood pressure (!) 90/58, pulse (!) 51, temperature (!) 97.4 F (36.3 C), temperature source Axillary, resp. rate 12, weight 50.9 kg, SpO2 96 %.        Intake/Output Summary (Last 24 hours) at 08/11/2021 0827 Last data filed at 08/11/2021 0700 Gross per 24 hour  Intake 5996.75 ml  Output 700 ml  Net 5296.75 ml   Filed Weights   08/11/21 0500  Weight: 50.9 kg    Examination: Constitutional: Chronically ill female resting in bed. No acute distress noted. Cardio: Bradycardia with regular rhythm. No murmurs, rubs, gallops. Pulm: Clear to auscultation bilaterally. Abdomen: Soft, non-distended. MSK: Cachectic,  no edema noted Skin: Skin is warm and dry. Neuro: Does not arouse to voice.  Resolved Hospital Problem list      Assessment & Plan:  Septic shock due to RUL CAP Chronic hypotension Respiratory virus panel negative. Blood culture, urine culture pending at this time. Procalcitonin 0.26. Slight bump in white count 10.8>11.5. - Continue ceftriaxone and azithromycin - Collect sputum culture if able - Blood culture shows NGTD. Follow blood cultures until finalized - Continue vasopressors as required to maintain MAP >65   Chronic anemia Hgb stable this morning at 10.2. - Transfuse for Hb<7 or hemodynamically significant bleeding - Trend CBC   Moderate protein energy malnutrition - NGT in place - Will need swallow evaluation prior to progressing diet   Severe dementia Patient wishes for patient to remain full code.  - Palliative care consulted  Best Practice (right click and "Reselect all SmartList Selections" daily)   Diet/type: NPO DVT prophylaxis: prophylactic heparin  GI prophylaxis: PPI Lines: N/A Foley:  N/A Code Status:  full code Last date of multidisciplinary goals of care discussion [11/12]  Labs   CBC: Recent Labs  Lab 08/10/21 0909 08/11/21 0227  WBC 10.8* 11.5*  NEUTROABS 9.0*  --   HGB 11.3* 10.2*  HCT 34.5* 31.5*  MCV 97.2 98.1  PLT 325 0000000    Basic Metabolic Panel: Recent Labs  Lab 08/10/21 0909 08/10/21 1710 08/11/21 0227  NA 136  --  136  K 3.7  --  3.4*  CL 100  --  101  CO2 27  --  24  GLUCOSE 77  --  61*  BUN 11  --  9  CREATININE 0.62  --  0.41*  CALCIUM 8.6*  --  8.1*  MG  --  2.3 1.7  PHOS  --   --  2.8   GFR: CrCl cannot be calculated (Unknown ideal weight.). Recent Labs  Lab 08/10/21 0909 08/10/21 1101 08/10/21 2054 08/11/21 0227  PROCALCITON  --   --  0.26  --   WBC 10.8*  --   --  11.5*  LATICACIDVEN 0.8 1.4  --   --     Liver Function Tests: Recent Labs  Lab 08/10/21 0909  AST 36  ALT 26  ALKPHOS 106   BILITOT 0.5  PROT 6.7  ALBUMIN 2.5*   No results for input(s): LIPASE, AMYLASE in the last 168 hours. No results for input(s): AMMONIA in the last 168 hours.  ABG    Component Value Date/Time   PHART 7.522 (H) 10/14/2020 1615   PCO2ART 31.9 (L) 10/14/2020 1615   PO2ART 55 (L) 10/14/2020 1615   HCO3 26.4 10/14/2020 1615   TCO2 27 10/14/2020 1615   O2SAT 93.0 10/14/2020 1615     Coagulation Profile: Recent Labs  Lab 08/10/21 0909  INR 1.3*    Cardiac Enzymes: No results for input(s): CKTOTAL, CKMB, CKMBINDEX, TROPONINI in the last 168 hours.  HbA1C: No results found for: HGBA1C  CBG: Recent Labs  Lab 08/10/21 2307 08/11/21 0323 08/11/21 0359 08/11/21 0442 08/11/21 0707  GLUCAP 150* 33* 89 93 85    Review of Systems:   Review of Systems  Unable to perform ROS: Dementia    Past Medical History:  She,  has a past medical history of CVA (cerebral vascular accident) (HCC) and Dementia (HCC).   Surgical History:   Past Surgical History:  Procedure Laterality Date   BRAIN SURGERY     age 34     Social History:   reports that she quit smoking about 11 years ago. She has never used smokeless tobacco. She reports that she does not drink alcohol and does not use drugs.   Family History:  Her family history includes Diabetes in her mother.   Allergies No Known Allergies   Home Medications  Prior to Admission medications   Medication Sig Start Date End Date Taking? Authorizing Provider  famotidine (PEPCID) 10 MG tablet Take 10 mg by mouth daily.   Yes [provider]  levETIRAcetam (KEPPRA) 500 MG tablet Take 1 tablet (500 mg total) by mouth 2 (two) times daily. 05/04/17  Yes Ginger Carne, MD  midodrine (PROAMATINE) 5 MG tablet Take 1 tablet (5 mg total) by mouth 3 (three) times daily with meals. 11/03/20  Yes Christian, Rylee, MD  Multiple Vitamin (MULTIVITAMIN WITH MINERALS) TABS tablet Take 1 tablet by mouth daily.   Yes [provider]   Nutritional Supplements (ENSURE ENLIVE PO) Take 8 fluid ounces by mouth 3 (three) times daily.   Yes [provider]  sennosides-docusate sodium (SENOKOT-S) 8.6-50 MG tablet Take 1 tablet by mouth in the morning and at bedtime.   Yes [provider]     Critical care time: 35 minutes

## 2021-08-11 NOTE — Progress Notes (Signed)
eLink Physician-Brief Progress Note Patient Name: Janet Gutierrez DOB: 10/20/1945 MRN: [id number]   Date of Service  08/11/2021  HPI/Events of Note  Received request to order PICC line as recommended by bedside MD. Patient with ongoing pressors.  eICU Interventions  Order for PICC line placed        Darl Pikes 08/11/2021, 10:11 PM

## 2021-08-12 DIAGNOSIS — J189 Pneumonia, unspecified organism: Secondary | ICD-10-CM

## 2021-08-12 LAB — BASIC METABOLIC PANEL
Anion gap: 7 (ref 5–15)
BUN: 7 mg/dL — ABNORMAL LOW (ref 8–23)
CO2: 27 mmol/L (ref 22–32)
Calcium: 8.2 mg/dL — ABNORMAL LOW (ref 8.9–10.3)
Chloride: 103 mmol/L (ref 98–111)
Creatinine, Ser: 0.34 mg/dL — ABNORMAL LOW (ref 0.44–1.00)
GFR, Estimated: >60 mL/min (ref >60)
Glucose, Bld: 109 mg/dL — ABNORMAL HIGH (ref 70–99)
Potassium: 3.5 mmol/L (ref 3.5–5.1)
Sodium: 137 mmol/L (ref 135–145)

## 2021-08-12 LAB — CBC
HCT: 29.2 % — ABNORMAL LOW (ref 36.0–46.0)
Hemoglobin: 9.4 g/dL — ABNORMAL LOW (ref 12.0–15.0)
MCH: 31.1 pg (ref 26.0–34.0)
MCHC: 32.2 g/dL (ref 30.0–36.0)
MCV: 96.7 fL (ref 80.0–100.0)
Platelets: 294 10*3/uL (ref 150–400)
RBC: 3.02 MIL/uL — ABNORMAL LOW (ref 3.87–5.11)
RDW: 13.9 % (ref 11.5–15.5)
WBC: 7.6 10*3/uL (ref 4.0–10.5)
nRBC: 0 % (ref 0.0–0.2)

## 2021-08-12 LAB — GLUCOSE, CAPILLARY
Glucose-Capillary: 99 mg/dL (ref 70–99)
Glucose-Capillary: 123 mg/dL — ABNORMAL HIGH (ref 70–99)
Glucose-Capillary: 123 mg/dL — ABNORMAL HIGH (ref 70–99)
Glucose-Capillary: 65 mg/dL — ABNORMAL LOW (ref 70–99)
Glucose-Capillary: 68 mg/dL — ABNORMAL LOW (ref 70–99)
Glucose-Capillary: 43 mg/dL — CL (ref 70–99)
Glucose-Capillary: 73 mg/dL (ref 70–99)
Glucose-Capillary: 77 mg/dL (ref 70–99)
Glucose-Capillary: 148 mg/dL — ABNORMAL HIGH (ref 70–99)

## 2021-08-12 LAB — PHOSPHORUS
Phosphorus: 2.5 mg/dL (ref 2.5–4.6)
Phosphorus: 3 mg/dL (ref 2.5–4.6)

## 2021-08-12 LAB — LEVETIRACETAM LEVEL: Levetiracetam Lvl: 6.6 ug/mL — ABNORMAL LOW (ref 10.0–40.0)

## 2021-08-12 LAB — MAGNESIUM: Magnesium: 2 mg/dL (ref 1.7–2.4)

## 2021-08-12 MED ORDER — FREE WATER
150.0000 mL | Status: DC
  Administered 2021-08-12 – 2021-08-18 (×35): 150 mL

## 2021-08-12 MED ORDER — SODIUM CHLORIDE 0.9 % IV SOLN: 250.0000 mL | INTRAVENOUS | Status: DC

## 2021-08-12 MED ORDER — PROSOURCE TF PO LIQD
45.0000 mL | Freq: Every day | ORAL | Status: DC
  Administered 2021-08-13 – 2021-08-18 (×6): 45 mL
  Filled 2021-08-12 (×6): qty 45

## 2021-08-12 MED ORDER — HYDROCORTISONE SOD SUC (PF) 100 MG IJ SOLR
100.0000 mg | Freq: Two times a day (BID) | INTRAMUSCULAR | Status: DC
  Administered 2021-08-12 – 2021-08-14 (×4): 100 mg via INTRAVENOUS
  Filled 2021-08-12 (×4): qty 2

## 2021-08-12 MED ORDER — ALBUMIN HUMAN 5 % IV SOLN
12.5000 g | Freq: Once | INTRAVENOUS | Status: AC
  Administered 2021-08-12: 12.5 g via INTRAVENOUS
  Filled 2021-08-12: qty 250

## 2021-08-12 MED ORDER — NOREPINEPHRINE 4 MG/250ML-% IV SOLN
2.0000 ug/min | INTRAVENOUS | Status: DC
  Administered 2021-08-12: 2 ug/min via INTRAVENOUS

## 2021-08-12 MED ORDER — POTASSIUM CHLORIDE CRYS ER 20 MEQ PO TBCR
40.0000 meq | EXTENDED_RELEASE_TABLET | Freq: Once | ORAL | Status: AC
  Administered 2021-08-12: 40 meq via ORAL
  Filled 2021-08-12: qty 2

## 2021-08-12 MED ORDER — DEXTROSE 50 % IV SOLN
25.0000 mL | Freq: Once | INTRAVENOUS | Status: AC
  Administered 2021-08-12: 25 mL via INTRAVENOUS

## 2021-08-12 MED ORDER — DEXTROSE 50 % IV SOLN
INTRAVENOUS | Status: AC
  Administered 2021-08-12: 25 mL
  Filled 2021-08-12: qty 50

## 2021-08-12 MED ORDER — OSMOLITE 1.5 CAL PO LIQD
1000.0000 mL | ORAL | Status: DC
  Administered 2021-08-12 – 2021-08-17 (×3): 1000 mL
  Filled 2021-08-12 (×9): qty 1000

## 2021-08-12 MED ORDER — ADULT MULTIVITAMIN W/MINERALS CH
1.0000 | ORAL_TABLET | Freq: Every day | ORAL | Status: DC
  Administered 2021-08-12 – 2021-08-18 (×7): 1
  Filled 2021-08-12 (×7): qty 1

## 2021-08-12 MED ORDER — DEXTROSE 10 % IV SOLN: INTRAVENOUS | Status: DC

## 2021-08-12 NOTE — Progress Notes (Addendum)
eLink Physician-Brief Progress Note Patient Name: [name] DOB: 1946-02-20 MRN: [id number]   Date of Service  08/12/2021  HPI/Events of Note  Patient with soft blood pressures, bedside RN started her back on Epinephrine gtt, EF is 60-65 % and last BNP was 65. Albumin is 2.5 gm / dl. Blood sugar now up to 148 on D 10 % + enteral nutrition.  eICU Interventions  Albumin 250 ml iv x 1, Peripheral Norepinephrine ordered for BP that fails to adequately respond to fluid bolus, wean off Epinephrine gtt, D 10 % gtt will be cut in half, and if blood glucose remains within acceptable range, it will be discontinued.        Thomasene Lot Liesel Peckenpaugh 08/12/2021, 11:28 PM

## 2021-08-12 NOTE — Progress Notes (Signed)
Daily Progress Note   Patient Name: Janet Gutierrez       Date: 08/12/2021 DOB: 1946-02-25  Age: 75 y.o. MRN#: [id number] Attending Physician: Candee Furbish, MD Primary Care Physician: Patient, No Pcp Per (Inactive) Admit Date: 08/10/2021  Reason for Consultation/Follow-up: Establishing goals of care  Subjective: Chart reviewed. Patient was weaned off vasopressor therapy earlier today.   I met in the 62M conference room with patient's son Patsy Baltimore and his wife Aldona Bar. We reviewed patient's current medical condition within the context of her underlying co-morbidities. Discussed that she is in septic shock secondary to pneumonia. Quinton and Aldona Bar ask if there is anything that could have been done differently to prevent her current illness.   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 Explained that risk of aspiration can be reduced with appropriate precautions such as ensuring patient is alert, upright, small bites/sips, and good oral care before meals. However, risk of aspiration cannot be eliminated even with all precautions. Emphasized that unfortunately, aspiration pneumonia will continue to be a recurrent issue for the patient. Their goal is to maximize the time between aspiration events.   Spent time acknowledging the excellent care Quinton and Aldona Bar have provided to Mrs. Moehle at home. They have been very devoted to her care and making sure that she has the best care possible. They believe she still has quality of life. At baseline, they report patient is ambulatory with assistance from [place]. They carefully hand feed her several times a day. They report that at baseline, Mrs. Massman still seems to recognize them and is communicative at times.    Quinton and Aldona Bar are adamant that Mrs. Gramlich return home with [facility] when medically stable. They know she will do better at home under their care than she would in SNF. They express frustration regarding her previous hospitalization during which they felt pushed to discharge her to SNF.   Patsy Baltimore and Aldona Bar do acknowledge that Mrs. Presswood will ultimately continue to decline. However, they do not want to hasten this process in any way. They were told during patient's previous hospitalization that she was approaching EOL and state "now here she is 10 months later". Discussed that when the time comes that patient is imminently approaching EOL, that [facility] can assist with that transition.    Length of  Stay: 2    Physical Exam Vitals reviewed.  Constitutional:      General: She is not in acute distress.    Appearance: She is ill-appearing.  Pulmonary:     Effort: Pulmonary effort is normal.  Neurological:     Motor: Weakness present.     Comments: Does not follow commands  Psychiatric:        Speech: She is noncommunicative.        Cognition and Memory: Cognition is impaired.            Vital Signs: BP (!) 112/52   Pulse (!) 58   Temp 98 F (36.7 C) (Oral)   Resp 13   Wt 46.8 kg   SpO2 100%   BMI 16.16 kg/m  SpO2: SpO2: 100 % O2 Device: O2 Device: Nasal Cannula O2 Flow Rate: O2 Flow Rate (L/min): 2 L/min   LBM: Last BM Date:  (pta) Baseline Weight: Weight: 50.9 kg Most recent weight: Weight: 46.8 kg       Palliative Assessment/Data: PPS 30%     Palliative Care Assessment & Plan   HPI/Patient Profile: 75 y.o. female  with past medical history of CVA and dementia who is non-verbal at baseline presented to the ED from [facility] where she was staying for respite care while family traveled with hypoxia. Patient was admitted on 08/10/2021 with septic shock due to RUL CAP and moderate protein calorie malnutrition.    Of note, patient and family have been seen by PMT  several times during previous hospitalizations where son was clear for full code/full scope.  Assessment: - Septic shock due to CAP - acute respiratory failure - chronic hypotension - moderate protein malnutrition - severe dementia  Recommendations/Plan: Continue current care, family is hopeful for improvement Discharging home is appropriate for this patient with the support of [facility] and her son and daughter-in-law.  SNF/rehab is not recommended  If patient's mental status does not improve and patient is imminently approaching EOL, [facility] can provide support with EOL care    Code Status: Avoid pressuring on code status with this family. When EOL is imminent, will emphasize to family that CPR and intubation would be ineffective and will not be done.   Prognosis:  < 6 months would not be surprising  Discharge Planning: Home with [facility]    Thank you for allowing the Palliative Medicine Team to assist in the care of this patient.   Total Time 68 minutes Prolonged Time Billed  yes       Greater than 50%  of this time was spent counseling and coordinating care related to the above assessment and plan.  Lavena Bullion, NP  Please contact Palliative Medicine Team phone at [phone] for questions and concerns.

## 2021-08-12 NOTE — Progress Notes (Addendum)
Initial Nutrition Assessment  DOCUMENTATION CODES:   Severe malnutrition in context of chronic illness, Underweight  INTERVENTION:   Tube feeding via NG tube: Change to Osmolite 1.5 at 45 ml/h (1080 ml per day) Prosource TF 45 ml once daily  Provides 1660 kcal, 79 gm protein, 823 ml free water daily.  Free water flushes 150 ml every 4 hours to provide a total of 1723 ml free water daily.  Continue to monitor K, Phos, and Mag BID x 1-2 more days to ensure they stay WNL with increase in calorie provision.   Add MVI with minerals via tube once daily.  NUTRITION DIAGNOSIS:   Severe Malnutrition related to chronic illness (dementia) as evidenced by severe muscle depletion, severe fat depletion, percent weight loss (22% weight loss within 1 year).  GOAL:   Patient will meet greater than or equal to 90% of their needs  MONITOR:   TF tolerance, Skin, Labs, Diet advancement  REASON FOR ASSESSMENT:   Consult Enteral/tube feeding initiation and management  ASSESSMENT:   75 yo female admitted from [facility] SNF with septic shock d/t RUL PNA. PMH includes CVA at 75 yo, dementia.  Discussed patient in ICU rounds and with RN today.  Requiring epinephrine. Tolerating TF well at this time via NG tube. Received MD Consult for TF initiation and management.  NG tube was placed 11/12. Currently receiving Vital High Protein at 40 ml/h with Prosource TF 45 ml BID to provide 1040 kcal, 106 gm protein, 803 ml free water daily.    Weight history reviewed. 22% weight loss over the past 8 months is severe.   Labs reviewed. BUN 7 (L), Creatinine 0.34 (L) CBG: [id number]  Low K and Phos have been repleted (low likely r/t refeeding syndrome). Both are at the low end of normal range today. K 3.5 and Phos 2.5. Would continue to check K, Phos, and Mag BID for 1-2 days to ensure they stay WNL with increase in calorie provision.   Medications reviewed and include epinephrine, Keppra, IV  antibiotics.  Patient meets criteria for severe malnutrition with severe depletion of muscle and subcutaneous fat mass and 22% weight loss within one year.   NUTRITION - FOCUSED PHYSICAL EXAM:  Flowsheet Row Most Recent Value  Orbital Region Severe depletion  Upper Arm Region Severe depletion  Thoracic and Lumbar Region Severe depletion  Buccal Region Severe depletion  Temple Region Severe depletion  Clavicle Bone Region Severe depletion  Clavicle and Acromion Bone Region Severe depletion  Scapular Bone Region Severe depletion  Dorsal Hand Severe depletion  Patellar Region Severe depletion  Anterior Thigh Region Severe depletion  Posterior Calf Region Severe depletion  Edema (RD Assessment) None  Hair Reviewed  Eyes Unable to assess  Mouth Reviewed  Skin Reviewed  Nails Reviewed       Diet Order:   Diet Order             Diet NPO time specified  Diet effective now                   EDUCATION NEEDS:   Not appropriate for education at this time  Skin:  Skin Assessment: Skin Integrity Issues: Skin Integrity Issues:: Stage I, Stage II Stage I: L knee, L shoulder, R ankle Stage II: L hip, L ankle, sacrum  Last BM:  PTA  Height:   Ht Readings from Last 1 Encounters:  12/21/20 5\' 7"  (1.702 m)    Weight:   Wt Readings from Last 1  Encounters:  08/12/21 46.8 kg    BMI:  Body mass index is 16.16 kg/m.  Estimated Nutritional Needs:   Kcal:  1600-1800  Protein:  75-85 gm  Fluid:  1.6-1.8 L    Gabriel Rainwater, RD, LDN, CNSC Please refer to Amion for contact information.

## 2021-08-12 NOTE — Progress Notes (Signed)
Pts CBG 43. Half amp D50 given. Will recheck CBG in 15 min and notify MD.

## 2021-08-12 NOTE — Progress Notes (Signed)
NAME:  Janet Gutierrez, MRN:  [id number], DOB:  08-19-1946, LOS: 2 ADMISSION DATE:  08/10/2021, CONSULTATION DATE:  08/10/2021 REFERRING MD:  Janet Gutierrez, CHIEF COMPLAINT:  Septic shock   History of Present Illness:  Janet Bustamante. Smaldone is a 75 y.o. F who presented from [facility] after being found to have fever, hypotensive, and poorly responsive mental status. O2 sats were reported to be 86% on room air at that time. EMS was called and on initial evaluation she was found to be hypotensive to 90/50 and placed on 4L [place] which improved O2 sats to 97%.    On arrival to ED she received fluid resuscitation of 2.5L with minimal response in BP. Chest x-ray showed suspected pneumonia and sepsis treatment was initiated. She was transferred to ICU for hypotension minimally responsive to fluid resuscitation requiring pressors and baseline midodrine.  Pertinent  Medical History  Severe dementia, CVA  Significant Hospital Events: Including procedures, antibiotic start and stop dates in addition to other pertinent events   08/10/2021 Admitted MICU  Interim History / Subjective:  Janet Gutierrez has been more hemodynamically stable overnight in terms of BP and HR. She is on 3.5 mcg epinephrine for hemodynamic support.  Objective   Blood pressure 95/70, pulse 74, temperature 98.3 F (36.8 C), temperature source Axillary, resp. rate 15, weight 46.8 kg, SpO2 100 %.        Intake/Output Summary (Last 24 hours) at 08/12/2021 0832 Last data filed at 08/12/2021 0700 Gross per 24 hour  Intake 2473.33 ml  Output 1820 ml  Net 653.33 ml   Filed Weights   08/11/21 0500 08/12/21 0500  Weight: 50.9 kg 46.8 kg    Examination: Constitutional: Frail elderly woman resting in bed. No acute distress noted. Cardio: Regular rate and rhythm. No murmurs, rubs, gallops. Pulm: Diffuse rhonchi. O2 sats appropriate on 2L [place]. Abdomen: Soft, non-distended. MSK: Extremities are cachectic. No extremity edema  noted. Skin: Skin is warm and dry. Neuro: Patient is lethargic and does not open eyes to voice however does follow commands.  Resolved Hospital Problem list      Assessment & Plan:  Septic shock due to RUL CAP Chronic hypotension Respiratory virus panel negative. Blood culture shows NGTD, urine culture showing multiple species present with recommendation of recollection. Leukocytosis improved to 7.6. - Continue ceftriaxone 2 g daily and azithromycin 500 mg daily, today is day 3 of treatment. - Collect sputum culture if able - Follow blood cultures - Recollect urine culture - Continue vasopressors as required to maintain MAP >65   Chronic anemia Hgb stable this morning at 9.4. - Transfuse for Hb<7 or hemodynamically significant bleeding - Trend CBC   Moderate protein energy malnutrition - NGT in place - TPN initiated 11/13   Severe dementia - Palliative care consulted, appreciate their assistance  - Son wishes for patient to remain full code and ultimately return home with him. Palliative will continue conversations with the family.  Best Practice (right click and "Reselect all SmartList Selections" daily)   Diet/type: TPN DVT prophylaxis: prophylactic heparin  GI prophylaxis: PPI Lines: N/A Foley:  Yes, and it is still needed Code Status:  full code Last date of multidisciplinary goals of care discussion [11/13]  Labs   CBC: Recent Labs  Lab 08/10/21 0909 08/11/21 0227 08/12/21 0256  WBC 10.8* 11.5* 7.6  NEUTROABS 9.0*  --   --   HGB 11.3* 10.2* 9.4*  HCT 34.5* 31.5* 29.2*  MCV 97.2 98.1 96.7  PLT 325 254  294    Basic Metabolic Panel: Recent Labs  Lab 08/10/21 0909 08/10/21 1710 08/11/21 0227 08/11/21 1452 08/11/21 1700 08/12/21 0256  NA 136  --  136  --   --  137  K 3.7  --  3.4*  --   --  3.5  CL 100  --  101  --   --  103  CO2 27  --  24  --   --  27  GLUCOSE 77  --  61*  --   --  109*  BUN 11  --  9  --   --  7*  CREATININE 0.62  --  0.41*  --    --  0.34*  CALCIUM 8.6*  --  8.1*  --   --  8.2*  MG  --  2.3 1.7  --   --  2.0  PHOS  --   --  2.8 2.1* 2.1* 2.5   GFR: CrCl cannot be calculated (Unknown ideal weight.). Recent Labs  Lab 08/10/21 0909 08/10/21 1101 08/10/21 2054 08/11/21 0227 08/12/21 0256  PROCALCITON  --   --  0.26  --   --   WBC 10.8*  --   --  11.5* 7.6  LATICACIDVEN 0.8 1.4  --   --   --     Liver Function Tests: Recent Labs  Lab 08/10/21 0909  AST 36  ALT 26  ALKPHOS 106  BILITOT 0.5  PROT 6.7  ALBUMIN 2.5*   No results for input(s): LIPASE, AMYLASE in the last 168 hours. No results for input(s): AMMONIA in the last 168 hours.  ABG    Component Value Date/Time   PHART 7.522 (H) 10/14/2020 1615   PCO2ART 31.9 (L) 10/14/2020 1615   PO2ART 55 (L) 10/14/2020 1615   HCO3 26.4 10/14/2020 1615   TCO2 27 10/14/2020 1615   O2SAT 93.0 10/14/2020 1615     Coagulation Profile: Recent Labs  Lab 08/10/21 0909  INR 1.3*    Cardiac Enzymes: No results for input(s): CKTOTAL, CKMB, CKMBINDEX, TROPONINI in the last 168 hours.  HbA1C: No results found for: HGBA1C  CBG: Recent Labs  Lab 08/11/21 1150 08/11/21 1618 08/11/21 1912 08/11/21 2303 08/12/21 0305  GLUCAP 120* 233* 146* 109* 99    Review of Systems:   Review of Systems  Unable to perform ROS: Dementia (non-verbal at baseline)    Past Medical History:  She,  has a past medical history of CVA (cerebral vascular accident) (HCC) and Dementia (HCC).   Surgical History:   Past Surgical History:  Procedure Laterality Date   BRAIN SURGERY     age 69     Social History:   reports that she quit smoking about 11 years ago. She has never used smokeless tobacco. She reports that she does not drink alcohol and does not use drugs.   Family History:  Her family history includes Diabetes in her mother.   Allergies No Known Allergies   Home Medications  Prior to Admission medications   Medication Sig Start Date End Date  Taking? Authorizing Provider  famotidine (PEPCID) 10 MG tablet Take 10 mg by mouth daily.   Yes [provider]  levETIRAcetam (KEPPRA) 500 MG tablet Take 1 tablet (500 mg total) by mouth 2 (two) times daily. 05/04/17  Yes Ginger Carne, MD  midodrine (PROAMATINE) 5 MG tablet Take 1 tablet (5 mg total) by mouth 3 (three) times daily with meals. 11/03/20  Yes Elige Radon, MD  Multiple  Vitamin (MULTIVITAMIN WITH MINERALS) TABS tablet Take 1 tablet by mouth daily.   Yes [provider]  Nutritional Supplements (ENSURE ENLIVE PO) Take 8 fluid ounces by mouth 3 (three) times daily.   Yes [provider]  sennosides-docusate sodium (SENOKOT-S) 8.6-50 MG tablet Take 1 tablet by mouth in the morning and at bedtime.   Yes [provider]     Critical care time: 30 minutes

## 2021-08-12 NOTE — Consult Note (Addendum)
WOC Nurse Consult Note: Reason for Consult: Patient with several pressure injuries noted on admission. She favors her left side and all wounds except the sacral Unstageable pressure injury are on the left side. Wound type: Pressure Pressure Injury POA: Yes Measurement: Left lateral malleolus:  1cm round x 0.1cm partial thickness, Stage 2 PI. Scant serous exudate Left shoulder:  2cm x 1.5cm round partial thickness Stage 1 PI Spinal column:  Three small (<0.7cm x 0.1cm x 0.1cm) areas along the vertebral column of partial thickness skin loss. No exudate. Pink, moist wound bed. Coccygeal:  7cm x 5cm area of purple/maroon discoloration (DPTI) with a 3cm x 3cm area of full thickness skin loss with red and yellow center. Small serous to light yellow exudate. Currently Unstageable. Right posterior heel: 3cm x 4cm area of non blanching erythema, DTPI Left hip:  Stage 3 partial thckness skin injury measuring 3cm x 3.2cm x 0.2cm. Red and yellow wound bed, small serous exudate. Wound bed:As described above Drainage (amount, consistency, odor) As described above Periwound: intact, dry. Evidence of previous wound healing, scarring. Dressing procedure/placement/frequency:Patient is on a mattress replacement with low air loss and is being turned and repositioned. She has bilateral pressure redistribution heel boots in place. The sacral wound, left shoulder and left hip wounds all have a silicone foam dressing in place. I will make minor adjustments to the POC and have discussed same with the Bedside RN today, J. Dutch Quint. The left lateral malleolus wound, left shoulder and spinal column partial thickness, Stage 2 pressure injury wounds will require a silicone foam dressing with every 3 days changes.  The left hip Stage 3 pressure injury and sacral Unstageable wounds will be treated with an antimicrobial nonadherent (xeroform) dressing, gauze topper and silicone foam cover dressing. Turning and repositioning is in place.  It is preferred that time in the left side-lying position be minimized.  If possible, given respiratory condition, the [facility] should be maintained at or below a 30 degree angle due to concerns regarding the DTPI/Unstageable wound to the coccyx. I will provide a pressure redistribution chair cushion for her downstream use. Please obtain and place with belongings.  WOC nursing team will not follow, but will remain available to this patient, the nursing and medical teams.  Please re-consult if needed. Thanks, Ladona Mow, MSN, RN, GNP, [place]  Pager# [phone]

## 2021-08-12 NOTE — Progress Notes (Signed)
Pts CBG is 68, recheck 65. TF are running. 83ml D50 given.  Will recheck in 15 min.

## 2021-08-13 LAB — BASIC METABOLIC PANEL
Anion gap: 11 (ref 5–15)
BUN: 14 mg/dL (ref 8–23)
CO2: 26 mmol/L (ref 22–32)
Calcium: 8.5 mg/dL — ABNORMAL LOW (ref 8.9–10.3)
Chloride: 101 mmol/L (ref 98–111)
Creatinine, Ser: 0.39 mg/dL — ABNORMAL LOW (ref 0.44–1.00)
GFR, Estimated: >60 mL/min (ref >60)
Glucose, Bld: 210 mg/dL — ABNORMAL HIGH (ref 70–99)
Potassium: 3.8 mmol/L (ref 3.5–5.1)
Sodium: 138 mmol/L (ref 135–145)

## 2021-08-13 LAB — CBC WITH DIFFERENTIAL/PLATELET
Abs Immature Granulocytes: 0.05 10*3/uL (ref 0.00–0.07)
Basophils Absolute: 0 10*3/uL (ref 0.0–0.1)
Basophils Relative: 0 %
Eosinophils Absolute: 0 10*3/uL (ref 0.0–0.5)
Eosinophils Relative: 0 %
HCT: 31.6 % — ABNORMAL LOW (ref 36.0–46.0)
Hemoglobin: 10 g/dL — ABNORMAL LOW (ref 12.0–15.0)
Immature Granulocytes: 1 %
Lymphocytes Relative: 3 %
Lymphs Abs: 0.3 10*3/uL — ABNORMAL LOW (ref 0.7–4.0)
MCH: 30.9 pg (ref 26.0–34.0)
MCHC: 31.6 g/dL (ref 30.0–36.0)
MCV: 97.5 fL (ref 80.0–100.0)
Monocytes Absolute: 0.1 10*3/uL (ref 0.1–1.0)
Monocytes Relative: 2 %
Neutro Abs: 8 10*3/uL — ABNORMAL HIGH (ref 1.7–7.7)
Neutrophils Relative %: 94 %
Platelets: 284 10*3/uL (ref 150–400)
RBC: 3.24 MIL/uL — ABNORMAL LOW (ref 3.87–5.11)
RDW: 14.1 % (ref 11.5–15.5)
WBC: 8.5 10*3/uL (ref 4.0–10.5)
nRBC: 0 % (ref 0.0–0.2)

## 2021-08-13 LAB — GLUCOSE, CAPILLARY
Glucose-Capillary: 168 mg/dL — ABNORMAL HIGH (ref 70–99)
Glucose-Capillary: 88 mg/dL (ref 70–99)
Glucose-Capillary: 121 mg/dL — ABNORMAL HIGH (ref 70–99)
Glucose-Capillary: 121 mg/dL — ABNORMAL HIGH (ref 70–99)
Glucose-Capillary: 110 mg/dL — ABNORMAL HIGH (ref 70–99)
Glucose-Capillary: 129 mg/dL — ABNORMAL HIGH (ref 70–99)

## 2021-08-13 LAB — LEGIONELLA PNEUMOPHILA SEROGP 1 UR AG: L. pneumophila Serogp 1 Ur Ag: NEGATIVE

## 2021-08-13 LAB — MAGNESIUM: Magnesium: 2.1 mg/dL (ref 1.7–2.4)

## 2021-08-13 MED ORDER — POLYETHYLENE GLYCOL 3350 17 G PO PACK: 17.0000 g | PACK | Freq: Every day | ORAL | Status: DC | PRN

## 2021-08-13 MED ORDER — MIDODRINE HCL 5 MG PO TABS
10.0000 mg | ORAL_TABLET | Freq: Four times a day (QID) | ORAL | Status: DC
  Administered 2021-08-13 – 2021-08-18 (×20): 10 mg
  Filled 2021-08-13 (×19): qty 2

## 2021-08-13 MED ORDER — ACETAMINOPHEN 325 MG PO TABS: 650.0000 mg | ORAL_TABLET | ORAL | Status: DC | PRN

## 2021-08-13 MED ORDER — LEVETIRACETAM 100 MG/ML PO SOLN
500.0000 mg | Freq: Two times a day (BID) | ORAL | Status: DC
  Administered 2021-08-13 – 2021-08-18 (×11): 500 mg
  Filled 2021-08-13 (×11): qty 5

## 2021-08-13 MED ORDER — INSULIN ASPART 100 UNIT/ML IJ SOLN
0.0000 [IU] | INTRAMUSCULAR | Status: DC
Start: 2021-08-13 — End: 2021-08-13

## 2021-08-13 MED ORDER — INSULIN ASPART 100 UNIT/ML IJ SOLN: 0.0000 [IU] | INTRAMUSCULAR | Status: DC | PRN

## 2021-08-13 MED ORDER — DOCUSATE SODIUM 50 MG/5ML PO LIQD: 100.0000 mg | Freq: Two times a day (BID) | ORAL | Status: DC | PRN

## 2021-08-13 MED ORDER — MIDODRINE HCL 5 MG PO TABS: 10.0000 mg | ORAL_TABLET | Freq: Three times a day (TID) | ORAL | Status: DC

## 2021-08-13 NOTE — Progress Notes (Addendum)
NAME:  Janet Gutierrez, MRN:  [id number], DOB:  [birth year], LOS: 3 ADMISSION DATE:  08/10/2021, CONSULTATION DATE:  08/10/2021 REFERRING MD:  Pfeiffer-TRH, CHIEF COMPLAINT:  Septic shock   History of Present Illness:  Janet Gutierrez is a 75 y.o. F who presented from [facility] after being found to have fever, hypotensive, and poorly responsive mental status. O2 sats were reported to be 86% on room air at that time. EMS was called and on initial evaluation she was found to be hypotensive to 90/50 and placed on 4L [place] which improved O2 sats to 97%.    On arrival to ED she received fluid resuscitation of 2.5L with minimal response in BP. Chest x-ray showed suspected pneumonia and sepsis treatment was initiated. She was transferred to ICU for hypotension minimally responsive to fluid resuscitation requiring pressors and baseline midodrine.  Pertinent  Medical History  Severe dementia, CVA  Significant Hospital Events: Including procedures, antibiotic start and stop dates in addition to other pertinent events   11/12 Admitted MICU 11/12- Ceftriaxone 2g 11/12- Azithromycin 500 mg  Interim History / Subjective:  Patient switched to Levophed overnight with appropriate response at this time.   Objective   Blood pressure (!) 110/51, pulse (!) 59, temperature 97.7 F (36.5 C), temperature source Oral, resp. rate (!) 0, weight 46.8 kg, SpO2 99 %.        Intake/Output Summary (Last 24 hours) at 08/13/2021 0744 Last data filed at 08/13/2021 0700 Gross per 24 hour  Intake 2931.79 ml  Output 3305 ml  Net -373.21 ml   Filed Weights   08/11/21 0500 08/12/21 0500 08/13/21 0500  Weight: 50.9 kg 46.8 kg 46.8 kg    Examination: Constitutional: Frail elderly woman resting in bed. No acute distress noted. Cardio: Regular rate and rhythm. No murmurs, rubs, gallops. Pulm: Clear to auscultation bilaterally.  Abdomen: Soft, non-distended. MSK: Cachectic extremities without edema. Skin: Skin  is warm and dry. Neuro: Patient is nearly obtunded and does not open eyes to voice or follow commands.  Resolved Hospital Problem list      Assessment & Plan:  Septic shock due to RUL CAP Chronic hypotension Respiratory virus panel negative. Blood culture shows NGTD, strep pneumo negative, legionella pending.  - Continue ceftriaxone 2 g daily and azithromycin 500 mg daily, today is day 4 of treatment. - Collect sputum culture if able - Follow blood cultures - Titrate vasopressors as required to maintain SBP >70 - Midodrine 10 mg q6h   Chronic anemia Hgb stable this morning at 10.0. - Transfuse for Hb<7 or hemodynamically significant bleeding - Trend CBC   Moderate protein energy malnutrition - NGT in place - TPN initiated 11/13   Severe dementia - Palliative care consulted, appreciate their assistance  Best Practice (right click and "Reselect all SmartList Selections" daily)   Diet/type: TPN DVT prophylaxis: prophylactic heparin  GI prophylaxis: PPI Lines: N/A Foley:  N/A Code Status:  full code Last date of multidisciplinary goals of care discussion [11/13]  Labs   CBC: Recent Labs  Lab 08/10/21 0909 08/11/21 0227 08/12/21 0256 08/13/21 0144  WBC 10.8* 11.5* 7.6 8.5  NEUTROABS 9.0*  --   --  8.0*  HGB 11.3* 10.2* 9.4* 10.0*  HCT 34.5* 31.5* 29.2* 31.6*  MCV 97.2 98.1 96.7 97.5  PLT 325 254 294 [id number]    Basic Metabolic Panel: Recent Labs  Lab 08/10/21 0909 08/10/21 1710 08/11/21 0227 08/11/21 1452 08/11/21 1700 08/12/21 0256 08/12/21 1706 08/13/21 0144  NA 136  --  136  --   --  137  --  138  K 3.7  --  3.4*  --   --  3.5  --  3.8  CL 100  --  101  --   --  103  --  101  CO2 27  --  24  --   --  27  --  26  GLUCOSE 77  --  61*  --   --  109*  --  210*  BUN 11  --  9  --   --  7*  --  14  CREATININE 0.62  --  0.41*  --   --  0.34*  --  0.39*  CALCIUM 8.6*  --  8.1*  --   --  8.2*  --  8.5*  MG  --  2.3 1.7  --   --  2.0  --  2.1  PHOS  --   --   2.8 2.1* 2.1* 2.5 3.0  --    GFR: CrCl cannot be calculated (Unknown ideal weight.). Recent Labs  Lab 08/10/21 0909 08/10/21 1101 08/10/21 2054 08/11/21 0227 08/12/21 0256 08/13/21 0144  PROCALCITON  --   --  0.26  --   --   --   WBC 10.8*  --   --  11.5* 7.6 8.5  LATICACIDVEN 0.8 1.4  --   --   --   --     Liver Function Tests: Recent Labs  Lab 08/10/21 0909  AST 36  ALT 26  ALKPHOS 106  BILITOT 0.5  PROT 6.7  ALBUMIN 2.5*   No results for input(s): LIPASE, AMYLASE in the last 168 hours. No results for input(s): AMMONIA in the last 168 hours.  ABG    Component Value Date/Time   PHART 7.522 (H) 10/14/2020 1615   PCO2ART 31.9 (L) 10/14/2020 1615   PO2ART 55 (L) 10/14/2020 1615   HCO3 26.4 10/14/2020 1615   TCO2 27 10/14/2020 1615   O2SAT 93.0 10/14/2020 1615     Coagulation Profile: Recent Labs  Lab 08/10/21 0909  INR 1.3*    Cardiac Enzymes: No results for input(s): CKTOTAL, CKMB, CKMBINDEX, TROPONINI in the last 168 hours.  HbA1C: No results found for: HGBA1C  CBG: Recent Labs  Lab 08/12/21 1626 08/12/21 1920 08/12/21 2310 08/13/21 0305 08/13/21 0709  GLUCAP 73 77 148* 168* 88    Review of Systems:   Review of Systems  Unable to perform ROS: Patient nonverbal    Past Medical History:  She,  has a past medical history of CVA (cerebral vascular accident) (HCC) and Dementia (HCC).   Surgical History:   Past Surgical History:  Procedure Laterality Date   BRAIN SURGERY     age 56     Social History:   reports that she quit smoking about 11 years ago. She has never used smokeless tobacco. She reports that she does not drink alcohol and does not use drugs.   Family History:  Her family history includes Diabetes in her mother.   Allergies No Known Allergies   Home Medications  Prior to Admission medications   Medication Sig Start Date End Date Taking? Authorizing Provider  famotidine (PEPCID) 10 MG tablet Take 10 mg by mouth  daily.   Yes [provider]  levETIRAcetam (KEPPRA) 500 MG tablet Take 1 tablet (500 mg total) by mouth 2 (two) times daily. 05/04/17  Yes Ginger Carne, MD  midodrine (PROAMATINE) 5 MG tablet Take 1 tablet (5  mg total) by mouth 3 (three) times daily with meals. 11/03/20  Yes Christian, Rylee, MD  Multiple Vitamin (MULTIVITAMIN WITH MINERALS) TABS tablet Take 1 tablet by mouth daily.   Yes [provider]  Nutritional Supplements (ENSURE ENLIVE PO) Take 8 fluid ounces by mouth 3 (three) times daily.   Yes [provider]  sennosides-docusate sodium (SENOKOT-S) 8.6-50 MG tablet Take 1 tablet by mouth in the morning and at bedtime.   Yes [provider]     Critical care time: 30 minutes

## 2021-08-14 ENCOUNTER — Inpatient Hospital Stay (HOSPITAL_COMMUNITY): Payer: Medicare (Managed Care)

## 2021-08-14 LAB — CBC WITH DIFFERENTIAL/PLATELET
Abs Immature Granulocytes: 0.04 10*3/uL (ref 0.00–0.07)
Basophils Absolute: 0 10*3/uL (ref 0.0–0.1)
Basophils Relative: 0 %
Eosinophils Absolute: 0 10*3/uL (ref 0.0–0.5)
Eosinophils Relative: 0 %
HCT: 28.9 % — ABNORMAL LOW (ref 36.0–46.0)
Hemoglobin: 9.2 g/dL — ABNORMAL LOW (ref 12.0–15.0)
Immature Granulocytes: 1 %
Lymphocytes Relative: 14 %
Lymphs Abs: 0.8 10*3/uL (ref 0.7–4.0)
MCH: 31.3 pg (ref 26.0–34.0)
MCHC: 31.8 g/dL (ref 30.0–36.0)
MCV: 98.3 fL (ref 80.0–100.0)
Monocytes Absolute: 0.3 10*3/uL (ref 0.1–1.0)
Monocytes Relative: 5 %
Neutro Abs: 4.4 10*3/uL (ref 1.7–7.7)
Neutrophils Relative %: 80 %
Platelets: 264 10*3/uL (ref 150–400)
RBC: 2.94 MIL/uL — ABNORMAL LOW (ref 3.87–5.11)
RDW: 14.2 % (ref 11.5–15.5)
WBC: 5.5 10*3/uL (ref 4.0–10.5)
nRBC: 0 % (ref 0.0–0.2)

## 2021-08-14 LAB — BASIC METABOLIC PANEL
Anion gap: 8 (ref 5–15)
BUN: 14 mg/dL (ref 8–23)
CO2: 27 mmol/L (ref 22–32)
Calcium: 8.3 mg/dL — ABNORMAL LOW (ref 8.9–10.3)
Chloride: 100 mmol/L (ref 98–111)
Creatinine, Ser: 0.32 mg/dL — ABNORMAL LOW (ref 0.44–1.00)
GFR, Estimated: >60 mL/min (ref >60)
Glucose, Bld: 134 mg/dL — ABNORMAL HIGH (ref 70–99)
Potassium: 3.8 mmol/L (ref 3.5–5.1)
Sodium: 135 mmol/L (ref 135–145)

## 2021-08-14 LAB — GLUCOSE, CAPILLARY
Glucose-Capillary: 135 mg/dL — ABNORMAL HIGH (ref 70–99)
Glucose-Capillary: 114 mg/dL — ABNORMAL HIGH (ref 70–99)
Glucose-Capillary: 119 mg/dL — ABNORMAL HIGH (ref 70–99)
Glucose-Capillary: 84 mg/dL (ref 70–99)
Glucose-Capillary: 93 mg/dL (ref 70–99)

## 2021-08-14 LAB — MAGNESIUM: Magnesium: 2.4 mg/dL (ref 1.7–2.4)

## 2021-08-14 MED ORDER — POLYETHYLENE GLYCOL 3350 17 G PO PACK
17.0000 g | PACK | Freq: Every day | ORAL | Status: DC
  Administered 2021-08-14 – 2021-08-18 (×4): 17 g
  Filled 2021-08-14 (×4): qty 1

## 2021-08-14 MED ORDER — DOCUSATE SODIUM 50 MG/5ML PO LIQD
100.0000 mg | Freq: Two times a day (BID) | ORAL | Status: DC
  Administered 2021-08-14 – 2021-08-18 (×9): 100 mg
  Filled 2021-08-14 (×9): qty 10

## 2021-08-14 MED ORDER — POTASSIUM CHLORIDE 20 MEQ PO PACK
40.0000 meq | PACK | Freq: Once | ORAL | Status: AC
Start: 2021-08-14 — End: 2021-08-14
  Administered 2021-08-14: 40 meq
  Filled 2021-08-14: qty 2

## 2021-08-14 NOTE — Procedures (Signed)
Cortrak  Tube Type:  Cortrak - 43 inches Tube Location:  Left nare Initial Placement:  Stomach Secured by: Bridle Technique Used to Measure Tube Placement:  Marking at nare/corner of mouth Cortrak Secured At:  65 cm  Cortrak Tube Team Note:  Consult received to place a Cortrak feeding tube.   X-ray is required, abdominal x-ray has been ordered by the Cortrak team. Please confirm tube placement before using the Cortrak tube.   If the tube becomes dislodged please keep the tube and contact the Cortrak team at [email or web address] ([email or web address]) for replacement.  If after hours and replacement cannot be delayed, place a NG tube and confirm placement with an abdominal x-ray.    Maleki Hippe MS, RD, LDN Please refer to AMION for RD and/or RD on-call/weekend/after hours pager   

## 2021-08-14 NOTE — Progress Notes (Signed)
NAME:  Elvie Palomo, MRN:  [id number], DOB:  [birth year], LOS: 4 ADMISSION DATE:  08/10/2021, CONSULTATION DATE:  08/10/2021 REFERRING MD:  Pfeiffer-TRH, CHIEF COMPLAINT:  Septic shock   History of Present Illness:  Brystol Wasilewski. Rix is a 75 y.o. F who presented from [facility] after being found to have fever, hypotensive, and poorly responsive mental status. O2 sats were reported to be 86% on room air at that time. EMS was called and on initial evaluation she was found to be hypotensive to 90/50 and placed on 4L [place] which improved O2 sats to 97%.    On arrival to ED she received fluid resuscitation of 2.5L with minimal response in BP. Chest x-ray showed suspected pneumonia and sepsis treatment was initiated. She was transferred to ICU for hypotension minimally responsive to fluid resuscitation requiring pressors and baseline midodrine.  Pertinent  Medical History  Severe dementia, CVA  Significant Hospital Events: Including procedures, antibiotic start and stop dates in addition to other pertinent events   11/12 Admitted MICU 11/12- Ceftriaxone 2g 11/12- Azithromycin 500 mg  Interim History / Subjective:  Patient has weaned off of Levophed. Palliative had another discussion with patient's family yesterday and they continue to wish to take her home at discharge.  Objective   Blood pressure (!) 110/56, pulse (!) 53, temperature 97.6 F (36.4 C), temperature source Axillary, resp. rate 15, weight 46.9 kg, SpO2 99 %.        Intake/Output Summary (Last 24 hours) at 08/14/2021 0745 Last data filed at 08/14/2021 0700 Gross per 24 hour  Intake 2459.37 ml  Output 1084 ml  Net 1375.37 ml   Filed Weights   08/12/21 0500 08/13/21 0500 08/14/21 0314  Weight: 46.8 kg 46.8 kg 46.9 kg    Examination: Constitutional: Frail elderly woman resting in bed. No acute distress noted. Cardio: Regular rate and rhythm. No murmurs, rubs, gallops. Pulm: Diffuse rhonchi noted. Abdomen: Soft,  non-tender, non-distended. MSK: Cachectic extremities without edema. Skin: Skin is warm and dry. Neuro: Obtunded.  Resolved Hospital Problem list     Assessment & Plan:  Septic shock due to RUL CAP Chronic hypotension Blood culture shows NGTD, strep pneumo negative, legionella negative.  - Continue ceftriaxone 2 g daily and azithromycin 500 mg daily, today is day 4 of treatment. - Follow blood cultures - Midodrine 10 mg q6h   Chronic anemia Hgb stable this morning at 9.2. - Transfuse for Hb<7 or hemodynamically significant bleeding - Trend CBC   Moderate protein energy malnutrition - NGT in place - TPN initiated 11/13   Severe dementia - Palliative care consulted, appreciate their assistance  Best Practice (right click and "Reselect all SmartList Selections" daily)   Diet/type: TPN DVT prophylaxis: prophylactic heparin  GI prophylaxis: PPI Lines: N/A Foley:  Yes, and it is still needed Code Status:  full code Last date of multidisciplinary goals of care discussion [11/14]  Labs   CBC: Recent Labs  Lab 08/10/21 0909 08/11/21 0227 08/12/21 0256 08/13/21 0144 08/14/21 0148  WBC 10.8* 11.5* 7.6 8.5 5.5  NEUTROABS 9.0*  --   --  8.0* 4.4  HGB 11.3* 10.2* 9.4* 10.0* 9.2*  HCT 34.5* 31.5* 29.2* 31.6* 28.9*  MCV 97.2 98.1 96.7 97.5 98.3  PLT 325 254 294 284 264    Basic Metabolic Panel: Recent Labs  Lab 08/10/21 0909 08/10/21 1710 08/11/21 0227 08/11/21 1452 08/11/21 1700 08/12/21 0256 08/12/21 1706 08/13/21 0144 08/14/21 0148  NA 136  --  136  --   --  137  --  138 135  K 3.7  --  3.4*  --   --  3.5  --  3.8 3.8  CL 100  --  101  --   --  103  --  101 100  CO2 27  --  24  --   --  27  --  26 27  GLUCOSE 77  --  61*  --   --  109*  --  210* 134*  BUN 11  --  9  --   --  7*  --  14 14  CREATININE 0.62  --  0.41*  --   --  0.34*  --  0.39* 0.32*  CALCIUM 8.6*  --  8.1*  --   --  8.2*  --  8.5* 8.3*  MG  --  2.3 1.7  --   --  2.0  --  2.1 2.4  PHOS  --    --  2.8 2.1* 2.1* 2.5 3.0  --   --    GFR: CrCl cannot be calculated (Unknown ideal weight.). Recent Labs  Lab 08/10/21 0909 08/10/21 1101 08/10/21 2054 08/11/21 0227 08/12/21 0256 08/13/21 0144 08/14/21 0148  PROCALCITON  --   --  0.26  --   --   --   --   WBC 10.8*  --   --  11.5* 7.6 8.5 5.5  LATICACIDVEN 0.8 1.4  --   --   --   --   --     Liver Function Tests: Recent Labs  Lab 08/10/21 0909  AST 36  ALT 26  ALKPHOS 106  BILITOT 0.5  PROT 6.7  ALBUMIN 2.5*   No results for input(s): LIPASE, AMYLASE in the last 168 hours. No results for input(s): AMMONIA in the last 168 hours.  ABG    Component Value Date/Time   PHART 7.522 (H) 10/14/2020 1615   PCO2ART 31.9 (L) 10/14/2020 1615   PO2ART 55 (L) 10/14/2020 1615   HCO3 26.4 10/14/2020 1615   TCO2 27 10/14/2020 1615   O2SAT 93.0 10/14/2020 1615     Coagulation Profile: Recent Labs  Lab 08/10/21 0909  INR 1.3*    Cardiac Enzymes: No results for input(s): CKTOTAL, CKMB, CKMBINDEX, TROPONINI in the last 168 hours.  HbA1C: No results found for: HGBA1C  CBG: Recent Labs  Lab 08/13/21 1503 08/13/21 1859 08/13/21 2309 08/14/21 0302 08/14/21 0709  GLUCAP 121* 110* 129* 135* 114*    Review of Systems:   Review of Systems  Unable to perform ROS: Patient nonverbal   Past Medical History:  She,  has a past medical history of CVA (cerebral vascular accident) (HCC) and Dementia (HCC).   Surgical History:   Past Surgical History:  Procedure Laterality Date   BRAIN SURGERY     age 47     Social History:   reports that she quit smoking about 11 years ago. She has never used smokeless tobacco. She reports that she does not drink alcohol and does not use drugs.   Family History:  Her family history includes Diabetes in her mother.   Allergies No Known Allergies   Home Medications  Prior to Admission medications   Medication Sig Start Date End Date Taking? Authorizing Provider  famotidine  (PEPCID) 10 MG tablet Take 10 mg by mouth daily.   Yes [provider]  levETIRAcetam (KEPPRA) 500 MG tablet Take 1 tablet (500 mg total) by mouth 2 (two) times daily. 05/04/17  Yes  Ginger Carne, MD  midodrine (PROAMATINE) 5 MG tablet Take 1 tablet (5 mg total) by mouth 3 (three) times daily with meals. 11/03/20  Yes Christian, Rylee, MD  Multiple Vitamin (MULTIVITAMIN WITH MINERALS) TABS tablet Take 1 tablet by mouth daily.   Yes [provider]  Nutritional Supplements (ENSURE ENLIVE PO) Take 8 fluid ounces by mouth 3 (three) times daily.   Yes [provider]  sennosides-docusate sodium (SENOKOT-S) 8.6-50 MG tablet Take 1 tablet by mouth in the morning and at bedtime.   Yes [provider]     Critical care time: 30 minutes

## 2021-08-14 NOTE — Progress Notes (Addendum)
Daily Progress Note   Patient Name: Enrika Aguado       Date: 08/14/2021 DOB: 1945-10-04  Age: 75 y.o. MRN#: [id number] Attending Physician: Lorin Glass, MD Primary Care Physician: Patient, No Pcp Per (Inactive) Admit Date: 08/10/2021  Reason for Consultation/Follow-up: Establishing goals of care  Subjective: Chart reviewed. Note that patient opens eyes to voice but does not follow commands. Transferring out of the ICU today.   I spoke by phone with Angelica Chessman, patient's social worker at [facility]. She confirms that son is adamant to take patient home. She  confirms that [facility] will be able to provide adequate support at home even in patient's current. She reports that they will likely transition to increased care in-home (instead of patient traveling to the day center). [facility] also confirms that in the event patient is more imminently approaching EOL, that [facility] can provide hospice services in the home.     Length of Stay: 4          Vital Signs: BP (!) 113/46   Pulse (!) 46   Temp (!) 97.3 F (36.3 C) (Axillary)   Resp 11   Wt 46.9 kg   SpO2 97%   BMI 16.19 kg/m  SpO2: SpO2: 97 % O2 Device: O2 Device: Room Air O2 Flow Rate: O2 Flow Rate (L/min): 2 L/min       Palliative Assessment/Data: PPS 30%       Palliative Care Assessment & Plan   HPI/Patient Profile: 75 y.o. female  with past medical history of CVA and dementia who is non-verbal at baseline presented to the ED from [facility] where she was staying for respite care while family traveled with hypoxia. Patient was admitted on 08/10/2021 with septic shock due to RUL CAP and moderate protein calorie malnutrition.    Of note, patient and family have been seen by PMT several times during previous hospitalizations  where son was clear for full code/full scope.   Assessment: - Septic shock due to CAP - acute respiratory failure - chronic hypotension - moderate protein malnutrition - severe dementia   Recommendations/Plan: Continue current care Discharging home is appropriate for this patient with the support of [facility] and her son and daughter-in-law.  Family will not agree to SNF  Goals of Care and Additional Recommendations: Limitations on Scope of Treatment:  Full Scope Treatment  Code Status: Full  Prognosis:  poor  Discharge Planning: Home  Care plan was discussed with PCCM resident Dr. August Saucer  Thank you for allowing the Palliative Medicine Team to assist in the care of this patient.   Total Time 25 minutes Prolonged Time Billed  no       Greater than 50%  of this time was spent counseling and coordinating care related to the above assessment and plan.  Merry Proud, NP  Please contact Palliative Medicine Team phone at [phone] for questions and concerns.

## 2021-08-15 DIAGNOSIS — F0390 Unspecified dementia without behavioral disturbance: Secondary | ICD-10-CM

## 2021-08-15 DIAGNOSIS — Z515 Encounter for palliative care: Secondary | ICD-10-CM

## 2021-08-15 LAB — BASIC METABOLIC PANEL
Anion gap: 6 (ref 5–15)
BUN: 15 mg/dL (ref 8–23)
CO2: 30 mmol/L (ref 22–32)
Calcium: 8.4 mg/dL — ABNORMAL LOW (ref 8.9–10.3)
Chloride: 103 mmol/L (ref 98–111)
Creatinine, Ser: 0.38 mg/dL — ABNORMAL LOW (ref 0.44–1.00)
GFR, Estimated: >60 mL/min (ref >60)
Glucose, Bld: 89 mg/dL (ref 70–99)
Potassium: 4 mmol/L (ref 3.5–5.1)
Sodium: 139 mmol/L (ref 135–145)

## 2021-08-15 LAB — CBC WITH DIFFERENTIAL/PLATELET
Abs Immature Granulocytes: 0.11 10*3/uL — ABNORMAL HIGH (ref 0.00–0.07)
Basophils Absolute: 0 10*3/uL (ref 0.0–0.1)
Basophils Relative: 0 %
Eosinophils Absolute: 0 10*3/uL (ref 0.0–0.5)
Eosinophils Relative: 0 %
HCT: 28.9 % — ABNORMAL LOW (ref 36.0–46.0)
Hemoglobin: 9.3 g/dL — ABNORMAL LOW (ref 12.0–15.0)
Immature Granulocytes: 2 %
Lymphocytes Relative: 28 %
Lymphs Abs: 1.6 10*3/uL (ref 0.7–4.0)
MCH: 31.5 pg (ref 26.0–34.0)
MCHC: 32.2 g/dL (ref 30.0–36.0)
MCV: 98 fL (ref 80.0–100.0)
Monocytes Absolute: 0.6 10*3/uL (ref 0.1–1.0)
Monocytes Relative: 11 %
Neutro Abs: 3.2 10*3/uL (ref 1.7–7.7)
Neutrophils Relative %: 59 %
Platelets: 242 10*3/uL (ref 150–400)
RBC: 2.95 MIL/uL — ABNORMAL LOW (ref 3.87–5.11)
RDW: 14.1 % (ref 11.5–15.5)
WBC: 5.5 10*3/uL (ref 4.0–10.5)
nRBC: 0.5 % — ABNORMAL HIGH (ref 0.0–0.2)

## 2021-08-15 LAB — CULTURE, BLOOD (ROUTINE X 2)
Culture: NO GROWTH
Culture: NO GROWTH

## 2021-08-15 NOTE — Progress Notes (Signed)
Daily Progress Note   Patient Name: Janet Gutierrez       Date: 08/15/2021 DOB: [birth year]  Age: 75 y.o. MRN#: [id number] Attending Physician: Reymundo Poll, MD Primary Care Physician: Patient, No Pcp Per (Inactive) Admit Date: 08/10/2021   HPI/Patient Profile: 75 y.o. female  with past medical history of CVA and dementia who is non-verbal at baseline presented to the ED from [facility] where she was staying for respite care while family traveled with hypoxia. Patient was admitted on 08/10/2021 with septic shock due to RUL CAP and moderate protein calorie malnutrition.  Of note, patient and family have been seen by PMT several times during previous hospitalizations where son was clear for full code/full scope. 11/14 - off pressors  Subjective: I have discussed this case with PMT medical director Julaine Fusi, MD. Please refer to her note from 11/01/20. I expressed concern that patient is not "awake/alert" enough to tolerate oral feedings. Dr. Phillips Odor reports this was the same situation during patient's previous hospitalization in Jan-Feb 2022. PMT previous recommendation was that patient would ultimately do better at home with support from [facility], and this continues to be our recommendation.  Discussion had with IMTS and RD regarding above discussion and they are supportive that patient will be discharged home when medically stable. Education provided on the extensive services that [facility] can provide.   13:35 - I spoke with son/Quinton by phone. Discussed concern that his mother is not awake enough for oral intake at this time. Discussed that this was similar to previous hospitalization. Confirmed that patient did ultimately "wake up" once she was discharged home. Mena Goes is agrees with plan of  care to have patient discharge home when medically stable and to remove cortrak on discharge.   Length of Stay: 5   Vital Signs: BP (!) 109/42   Pulse (!) 50   Temp (!) 97.4 F (36.3 C) (Oral)   Resp 10   Wt 52.5 kg   SpO2 95%   BMI 18.13 kg/m  SpO2: SpO2: 95 % O2 Device: O2 Device: Room Air O2 Flow Rate: O2 Flow Rate (L/min): 2 L/min     Palliative Assessment/Data: PPS 30%       Palliative Care Assessment & Plan    Assessment: - Septic shock due to CAP - acute respiratory failure - chronic hypotension -  moderate protein malnutrition - severe dementia   Recommendations/Plan: Continue current care Appreciate IMTS and RD support in this unique an challenging situation Recommend discharge home with [facility] when medically stable Remove cortrak on discharge  Use of feeding tubes in patients with advanced dementia do not increase survival, promote quality of life, or prevent aspiration. They can promote isolation and use of restraints leading to increased potential for pressure ulcers. Use of PEG tubes is not medically recommended in this population; rather, careful hand feeding with aspiration precautions has been shown to provide the best quality of life.    Discharge Planning: Home   Thank you for allowing the Palliative Medicine Team to assist in the care of this patient.   Total Time 65 minutes Prolonged Time Billed  yes       Greater than 50%  of this time was spent counseling and coordinating care related to the above assessment and plan.  Merry Proud, NP  Please contact Palliative Medicine Team phone at [phone] for questions and concerns.

## 2021-08-15 NOTE — TOC Initial Note (Signed)
Transition of Care [facility]) - Initial/Assessment Note    Patient Details  Name: Janet Gutierrez MRN: [id number] Date of Birth: [birth year]  Transition of Care [facility]) CM/SW Contact:    Harriet Masson, RN Phone Number: 08/15/2021, 5:02 PM  Clinical Narrative:          Patient active with [facility]. [facility] [phone]. [facility] is going to call son, Mena Goes to find out if she needs to arranged transportation home tomorrow. She will notify the covering RNCM.     Expected Discharge Plan: Home w Home Health Services Barriers to Discharge: Continued Medical Work up   Patient Goals and CMS Choice    Son wants patient to return home, refusing SNF    Expected Discharge Plan and Services Expected Discharge Plan: Home w Home Health Services       Living arrangements for the past 2 months: Single Family Home                                      Prior Living Arrangements/Services Living arrangements for the past 2 months: Single Family Home Lives with:: Adult Children              Current home services: Other (comment) (has [facility] services)    Activities of Daily Living      Permission Sought/Granted                  Emotional Assessment       Orientation: :  (non verbal) Alcohol / Substance Use: Not Applicable Psych Involvement: No (comment)  Admission diagnosis:  Sepsis (HCC) [A41.9] Sepsis, due to unspecified organism, unspecified whether acute organ dysfunction present (HCC) [A41.9] Dementia, unspecified dementia severity, unspecified dementia type, unspecified whether behavioral, psychotic, or mood disturbance or anxiety (HCC) [F03.90] Patient Active Problem List   Diagnosis Date Noted   Dementia (HCC)    Palliative care by specialist    Sepsis (HCC) 08/10/2021   Goals of care, counseling/discussion    Aspiration into lower respiratory tract    Protein-calorie malnutrition, severe 10/24/2020   Pressure injury of skin 10/19/2020   Severe  sepsis (HCC) 10/13/2020   COVID-19 virus infection 10/13/2020   CAP (community acquired pneumonia) 10/13/2020   Dysphagia 10/13/2020   Spell of altered consciousness    Seizures (HCC)    Altered mental status 05/01/2017   Alzheimer's disease (HCC) 11/30/2013   PCP:  Patient, No Pcp Per (Inactive) Pharmacy:   [id number] [place],  - [address] [place] [address] Phone: [phone] Fax: [phone]     Social Determinants of Health (SDOH) Interventions    Readmission Risk Interventions No flowsheet data found.

## 2021-08-15 NOTE — Progress Notes (Signed)
Nutrition Follow-up  DOCUMENTATION CODES:   Severe malnutrition in context of chronic illness, Underweight  INTERVENTION:   Tube feeding via Cortrak tube: Osmolite 1.5 at 45 ml/h (1080 ml per day) Prosource TF 45 ml once daily  Provides 1660 kcal, 79 gm protein, 823 ml free water daily.  Free water flushes 150 ml every 4 hours to provide a total of 1723 ml free water daily.  Continue MVI with minerals via tube once daily.  NUTRITION DIAGNOSIS:   Severe Malnutrition related to chronic illness (dementia) as evidenced by severe muscle depletion, severe fat depletion, percent weight loss (22% weight loss within 1 year).  GOAL:   Patient will meet greater than or equal to 90% of their needs  MONITOR:   TF tolerance, Skin, Labs, Diet advancement  REASON FOR ASSESSMENT:   Consult Enteral/tube feeding initiation and management  ASSESSMENT:   75 yo female admitted from [facility] SNF with septic shock d/t RUL PNA. PMH includes CVA at 75 yo, dementia.  Discussed patient in ICU rounds and with RN today.  Tolerating TF well via Cortrak tube.  Cortrak tube was placed 11/16, tip is in the distal stomach. Currently receiving Osmolite 1.5 at 45 ml/h with Prosource TF 45 ml once daily to provide 1660 kcal, 79 gm protein, 823 ml free water daily. Free water flushes 150 ml every 4 hours.     Palliative care team following. Plans for d/c home with family per family request. Spoke with Palliative care NP; plans to pull Cortrak prior to discharge home. Do not feel like PEG is appropriate for this patient at this time. [facility] will continue to take care of her at home. Hopeful that patient will wake up and be able to take POs after discharge home.  Labs reviewed. Refeeding labs were low, but K, Phos, and Mag are now all WNL. CBG: [id number]  Medications reviewed and include Colace, Keppra, MVI with minerals, Miralax.  Diet Order:   Diet Order             Diet NPO time specified  Diet  effective now                   EDUCATION NEEDS:   Not appropriate for education at this time  Skin:  Skin Assessment: Skin Integrity Issues: Skin Integrity Issues:: Stage I, Stage II Stage I: L knee, L shoulder, R ankle Stage II: L hip, L ankle, sacrum  Last BM:  11/16  Height:   Ht Readings from Last 1 Encounters:  12/21/20 5\' 7"  (1.702 m)    Weight:   Wt Readings from Last 1 Encounters:  08/15/21 52.5 kg    BMI:  Body mass index is 18.13 kg/m.  Estimated Nutritional Needs:   Kcal:  1600-1800  Protein:  75-85 gm  Fluid:  1.6-1.8 L    08/17/21, RD, LDN, CNSC Please refer to Amion for contact information.

## 2021-08-15 NOTE — Plan of Care (Signed)

## 2021-08-15 NOTE — Progress Notes (Addendum)
Patient summary: Janet Gutierrez is a 75 year old female who presented from [facility] SNF with fever, hypotension and worsening mentation.  She was desaturating in the mid 80s on room air and found to have a RUL CAP.  She was admitted to the ICU on 11/12 for hypotension minimally responsive to IV fluid resuscitation requiring pressors and baseline midodrine. Weaned off of pressors and systolic pressures have maintained in the low 100s with MAPs >65.   Subjective: No acute overnight events.  Patient minimally responsive on exam, opens eyes to verbal stimuli but does not follow commands.  Resting comfortably and did not appear any acute distress  Objective:  Vital signs in last 24 hours: Vitals:   08/15/21 0800 08/15/21 1000 08/15/21 1110 08/15/21 1200  BP: (!) 109/56 (!) 109/42 (!) 85/46 95/82  Pulse: (!) 50 (!) 50 (!) 55 (!) 54  Resp: 13 10 13 14   Temp: (!) 97.4 F (36.3 C)  98.1 F (36.7 C)   TempSrc: Oral  Oral   SpO2: 98% 95% 97% 97%  Weight:       Physical Exam General: Frail, elderly woman, resting comfortably, no acute distress HEENT: Normocephalic, atraumatic, conjunctiva normal CV: Bradycardic, regular rhythm, no murmurs rubs or gallops Pulm: Diffuse rhonchi on bilateral anterior lung fields, normal work of breathing Abdomen: Soft, nondistended, bowel sounds present, no tenderness to palpation MSK: No lower extremity edema Skin: Warm and dry Neuro: Awakes to verbal stimuli, does not follow commands  Assessment/Plan:  Active Problems:   Alzheimer's disease (HCC)   Altered mental status   Severe sepsis (HCC)   CAP (community acquired pneumonia)   Pressure injury of skin   Protein-calorie malnutrition, severe   Goals of care, counseling/discussion   Dementia (HCC)   Palliative care by specialist  Janet Gutierrez is a 75 year old female who presented from [facility] SNF with fever, hypotension and worsening mentation.  She was desaturating in the mid 80s on room  air.  She was admitted to the ICU on 11/12 for hypotension minimally responsive to IV fluid resuscitation requiring pressors and baseline midodrine.  RUL CAP Chronic hypotension Initially presented in septic shock due to her right upper lobe community-acquired pneumonia, in the setting of chronic hypotension requiring midodrine.  She was minimally responsive to IV fluids and initially admitted to the ICU requiring pressor support.  Completed 5 days of ceftriaxone and azithromycin.  Blood cultures have shown no growth to date, negative strep pneumo and Legionella.  Patient weaned off of pressor support with MAPs maintaining in the 70s, SBP low 100s.  Saturating well on room air.  Mentation is still not at baseline, she awakes to verbal stimuli but is not following purposeful commands.  Per family, she is nonverbal at baseline but more alert and able to follow commands. -Continue midodrine  End-stage vascular dementia Encephalopathic Goals of care Patient is nonverbal at baseline.  History of prior CVA and per chart review Alzheimer's disease. Previously resided at [facility].  Patient's family prefers patient be discharged home with support of [facility] and family. Family is hopeful for improvement and want to continue full scope of care/full code. Disposition is likely home with [facility]. Unfortunately patient is high risk for another aspiration event with recurrent pneumonias.  Overall prognosis is poor.  Ideally would like to see improvement of patient's mentation and oral intake prior to discharge. -Palliative on board, appreciate assistance  Chronic bradycardia Patient's heart rate has maintained in the 40s and 50s during hospitalization. Will continue  to monitor.  Chronic anemia Hemoglobin stable at 9.3.  Iron studies and folate from January within normal limits.  No signs or symptoms of bleeding. -Transfuse for hemoglobin less than 7 -Trend CBC  Malnutrition Core track feeding tube placed  yesterday.  During previous hospitalization PEG tube placement was discussed with family and decided that patient would not do well with this.  Continue tube feeds. -Appreciate dietitian's assistance  History of seizures -Continue Keppra 500 mg twice daily  Prior to Admission Living Arrangement: [facility] SNF Anticipated Discharge Location: Home with [facility] Barriers to Discharge: Clinical improvement   Dispo: Anticipated discharge pending clinical improvement.   Lashanda Storlie N, DO 08/15/2021, 1:21 PM Pager: [phone] After 5pm on weekdays and 1pm on weekends: On Call pager [phone]

## 2021-08-16 MED ORDER — LACTATED RINGERS IV BOLUS
1000.0000 mL | Freq: Once | INTRAVENOUS | Status: AC
Start: 2021-08-16 — End: 2021-08-16
  Administered 2021-08-16: 1000 mL via INTRAVENOUS

## 2021-08-16 NOTE — Hospital Course (Addendum)
Septic shock secondary to right upper lobe pneumonia Chronic Hypotension Chronic Bradycardia   Patient initially treated in the ICU for septic shock due to aspiration pneumonia.  She received Levophed for pressor support and was started on ceftriaxone with azithromycin.  She finished 5 days of antibiotics.  Blood cultures came back negative.  Strep pneumo and Legionella were also negative.  Patient was weaned off of pressor support and transferred back to our service on hospital day 5.   She has chronic hypotension and bradycardia was initiated on midodrine during her last hospitalization for septic shock due to aspiration pneumonia.  Her blood pressure remains low with maps in the 50s-60s.  Adrenal insufficiency was ruled out with normal cortisol level.  She is taking midodrine 10 mg every 6 hours.  Her blood pressure is minimally responsive to fluid resuscitation.  Suspect that her hypotension is a sign of end-of-life.  Hx CVA with aphasia and R hemiplegia  End stage vascular dementia Protein caloric malnutrition Patient has been nonverbal this admission, per family, this is not her baseline but she usually becomes verbal with her family at home. Patient's family wishes for the patient to be discharged home, once medically stable, with [facility]. Discussed this with the patient's son, Janet Gutierrez, and will continue to keep the family involved with the patient's care and will aim to discharge her home.  Core track was placed here for nutrition and was removed before discharge as the patient's family provides assistance with her feedings.  No indication for PEG tube.    Patient remains a full code at this time, although it has been noted in the past by palliative that medically ineffective and futile interventions should not be performed.  However family continues to insist on aggressive measures and was transitioned back to full code this admission.  Patient will be discharged home with [facility].

## 2021-08-16 NOTE — Plan of Care (Signed)
Pt SB on monitor and A/O x 1. Tube feeding infusing. Q2 turns promoted. Pt low bps in am 85 systolic. MD notified. 1LR given. Bp 100 systolic MD notified. Not new orders at this. Denies pain SOB. Will continue to monitor. Will continue POC.   Problem: Education: Goal: Knowledge of General Education information will improve Description: Including pain rating scale, medication(s)/side effects and non-pharmacologic comfort measures Outcome: Progressing   Problem: Health Behavior/Discharge Planning: Goal: Ability to manage health-related needs will improve Outcome: Progressing   Problem: Clinical Measurements: Goal: Ability to maintain clinical measurements within normal limits will improve Outcome: Progressing Goal: Will remain free from infection Outcome: Progressing Goal: Diagnostic test results will improve Outcome: Progressing Goal: Respiratory complications will improve Outcome: Progressing Goal: Cardiovascular complication will be avoided Outcome: Progressing   Problem: Activity: Goal: Risk for activity intolerance will decrease Outcome: Progressing   Problem: Nutrition: Goal: Adequate nutrition will be maintained Outcome: Progressing   Problem: Coping: Goal: Level of anxiety will decrease Outcome: Progressing   Problem: Elimination: Goal: Will not experience complications related to bowel motility Outcome: Progressing Goal: Will not experience complications related to urinary retention Outcome: Progressing   Problem: Pain Managment: Goal: General experience of comfort will improve Outcome: Progressing   Problem: Safety: Goal: Ability to remain free from injury will improve Outcome: Progressing   Problem: Skin Integrity: Goal: Risk for impaired skin integrity will decrease Outcome: Progressing

## 2021-08-16 NOTE — Progress Notes (Addendum)
HD#6 SUBJECTIVE:  Patient Summary: Janet Gutierrez is a 75 y.o. with a pertinent PMH of dementia who is nonverbal at baseline and previous CVA, who presented with fever, hypotension, and AMS, and admitted for CAP.   Overnight Events: No acute events overnight  Interim History: This is hospital day 6 for Janet Gutierrez who was seen and evaluated at the bedside this morning. Patient minimally responsive on evaluation, will open eyes to her name being called, however, she is not able to follow commands still.   OBJECTIVE:  Vital Signs: Vitals:   08/15/21 2013 08/15/21 2219 08/16/21 0200 08/16/21 0332  BP: (!) 92/51 (!) 93/52 (!) 98/48   Pulse: (!) 47 (!) 53 (!) 56   Resp: 14 17 14    Temp: 97.8 F (36.6 C) 98.3 F (36.8 C) 97.9 F (36.6 C)   TempSrc: Axillary Axillary Axillary   SpO2: 99% 98% 97%   Weight:    52.8 kg   Supplemental O2: Room Air SpO2: 97 % O2 Flow Rate (L/min): 2 L/min  Filed Weights   08/14/21 0314 08/15/21 0500 08/16/21 0332  Weight: 46.9 kg 52.5 kg 52.8 kg     Intake/Output Summary (Last 24 hours) at 08/16/2021 0511 Last data filed at 08/16/2021 0416 Gross per 24 hour  Intake 1740.17 ml  Output 2560 ml  Net -819.83 ml   Net IO Since Admission: 7,437.68 mL [08/16/21 0511]  Physical Exam: General: ill, frail appearing woman laying in bed. Non verbal, not following commands.  CV: Bradycardic rate, regular rhythm. No LE edema Pulmonary: Diffuse crackles in anterior lung fields. Normal WOB on room air Abdominal: Soft, nontender, nondistended.  Extremities: Palpable radial and DP pulses.  Skin: Warm and dry.  Neuro: Awake, appears uncomfortable. Does not follow any commands.     ASSESSMENT/PLAN:  Assessment: Active Problems:   Alzheimer's disease (HCC)   Altered mental status   Severe sepsis (HCC)   CAP (community acquired pneumonia)   Pressure injury of skin   Protein-calorie malnutrition, severe   Goals of care,  counseling/discussion   Dementia (HCC)   Palliative care by specialist   Plan:  #Chronic Hypotension #Chronic Bradycardia   Patient has been out of the ICU for 2 days and is off pressors, however, BP is still running low with MAPs in the 50-60s. Per family, at patient's baseline, she is able to follow commands, however, she is unable to do so today. Her eyes are open, however, she is not following commands. Chronic hypotension is unlikely to be in the setting of adrenal insufficiency, as patient recently had a normal cortisol level. Blood pressure has been unresponsive to fluids, and she is on a high dose of midodrine at 10 mg q6h.  - Continue midodrine for hypotension - 1 L LR bolus  #Right upper lobe pneumonia, resolved Patient is s/p 5 days of azithromycin and ceftriaxone therapy. Blood cultures with no growth and negative strep pneumo and Legionella.   #Hx CVA with aphasia and R hemiplegia  #End stage vascular dementia #Nonverbal #Goals of care Patient has been nonverbal this admission, per family, this is not her baseline but she usually becomes verbal with her family at home. Patient's family wishes for the patient to be discharged home, once medically stable, with [facility]. Discussed this with the patient's son, 08/18/21, and will continue to keep the family involved with the patient's care and will aim to discharge her home, once medically stable. Additionally, will plan to remove NG tube once patient  is able to be discharged home, as the patient's family provides assistance with her feedings. Patient remains a full code at this time, although it has been noted in the past by palliative that medically ineffective and futile interventions should not be performed.  - Palliative care consulted, appreciate recommendations  Best Practice: Diet: Cortrak IVF: Fluids: none VTE: heparin injection 5,000 Units Start: 08/10/21 1530 SCDs Start: 08/10/21 1509 Code: Full AB: None DISPO: Anticipated  discharge to Home pending Medical stability.  Signature: Elza Rafter, D.O.  Internal Medicine Resident, PGY-1 [facility]  Pager: [phone] 5:11 AM, 08/16/2021   Please contact the on call pager after 5 pm and on weekends at [phone].

## 2021-08-16 NOTE — Care Management Important Message (Signed)
Important Message  Patient Details  Name: Janet Gutierrez MRN: [id number] Date of Birth: 05/17/1946   Medicare Important Message Given:  Yes     Dorena Bodo 08/16/2021, 2:04 PM

## 2021-08-16 NOTE — Progress Notes (Signed)
   08/16/21 1808  Assess: MEWS Score  Temp 97.8 F (36.6 C)  BP (!) 97/35  Pulse Rate (!) 47  ECG Heart Rate (!) 47  Resp 13  SpO2 99 %  Assess: MEWS Score  MEWS Temp 0  MEWS Systolic 1  MEWS Pulse 1  MEWS RR 1  MEWS LOC 0  MEWS Score 3  MEWS Score Color Yellow  Assess: if the MEWS score is Yellow or Red  Were vital signs taken at a resting state? Yes  Focused Assessment No change from prior assessment  Early Detection of Sepsis Score *See Row Information* Low  MEWS guidelines implemented *See Row Information* Yes  Treat  MEWS Interventions Escalated (See documentation below)  Take Vital Signs  Increase Vital Sign Frequency  Yellow: Q 2hr X 2 then Q 4hr X 2, if remains yellow, continue Q 4hrs  Escalate  MEWS: Escalate Yellow: discuss with charge nurse/RN and consider discussing with provider and RRT  Notify: Charge Nurse/RN  Name of Charge Nurse/RN Notified SArah Rn  Date Charge Nurse/RN Notified 08/16/21  Time Charge Nurse/RN Notified 1833  Notify: Provider  Provider Name/Title Teaching service  Date Provider Notified 08/16/21  Time Provider Notified 1836  Notification Type Page  Notification Reason Other (Comment) (VS)  Provider response No new orders  Date of Provider Response 08/16/21  Time of Provider Response 1837  Document  Progress note created (see row info) Yes

## 2021-08-17 DIAGNOSIS — F0280 Dementia in other diseases classified elsewhere without behavioral disturbance: Secondary | ICD-10-CM

## 2021-08-17 DIAGNOSIS — G309 Alzheimer's disease, unspecified: Secondary | ICD-10-CM

## 2021-08-17 LAB — BASIC METABOLIC PANEL WITH GFR
Anion gap: 8 (ref 5–15)
BUN: 12 mg/dL (ref 8–23)
CO2: 30 mmol/L (ref 22–32)
Calcium: 8.6 mg/dL — ABNORMAL LOW (ref 8.9–10.3)
Chloride: 101 mmol/L (ref 98–111)
Creatinine, Ser: 0.39 mg/dL — ABNORMAL LOW (ref 0.44–1.00)
GFR, Estimated: >60 mL/min
Glucose, Bld: 97 mg/dL (ref 70–99)
Potassium: 4.4 mmol/L (ref 3.5–5.1)
Sodium: 139 mmol/L (ref 135–145)

## 2021-08-17 LAB — CBC
HCT: 29.3 % — ABNORMAL LOW (ref 36.0–46.0)
Hemoglobin: 9.4 g/dL — ABNORMAL LOW (ref 12.0–15.0)
MCH: 31.4 pg (ref 26.0–34.0)
MCHC: 32.1 g/dL (ref 30.0–36.0)
MCV: 98 fL (ref 80.0–100.0)
Platelets: 172 K/uL (ref 150–400)
RBC: 2.99 MIL/uL — ABNORMAL LOW (ref 3.87–5.11)
RDW: 14.5 % (ref 11.5–15.5)
WBC: 6.7 K/uL (ref 4.0–10.5)
nRBC: 0.4 % — ABNORMAL HIGH (ref 0.0–0.2)

## 2021-08-17 MED ORDER — LACTATED RINGERS IV BOLUS
1000.0000 mL | Freq: Once | INTRAVENOUS | Status: AC
  Administered 2021-08-17: 1000 mL via INTRAVENOUS

## 2021-08-17 MED ORDER — CHLORHEXIDINE GLUCONATE 0.12 % MT SOLN
OROMUCOSAL | Status: AC
  Administered 2021-08-17: 15 mL
  Filled 2021-08-17: qty 15

## 2021-08-17 NOTE — Progress Notes (Signed)
HD#7 Subjective:  Patient Summary: 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  Ongoing palliative care discussions regarding CODE STATUS recommended for DNR and patient reaching end-of-life.  Overnight Events: No acute overnight events   Interim History: [name] was evaluated at bedside this morning.  She is nonverbal.  Appears comfortable resting comfortably in bed.  Objective:  Vital signs in last 24 hours: Vitals:   08/17/21 0200 08/17/21 0400 08/17/21 0614 08/17/21 0615  BP: (!) 95/46 (!) 86/51  (!) 83/38  Pulse: (!) 56 (!) 50  (!) 50  Resp: 13 13  14   Temp: 98.1 F (36.7 C) 97.8 F (36.6 C)  97.6 F (36.4 C)  TempSrc: Axillary Axillary  Axillary  SpO2: 95% 95%  96%  Weight:   49.3 kg    Supplemental O2: Nasal Cannula SpO2: 96 % O2 Flow Rate (L/min): 2 L/min   Physical Exam:  Constitutional: Chronically ill-appearing elderly female, resting comfortably in bed HENT: normocephalic atraumatic, mucous membranes moist, anicteric sclera Cardiovascular: Bradycardic, no m/r/g Pulmonary/Chest: normal work of breathing on room air, poor inspiratory effort, lungs clear to auscultation bilateral Abdominal: soft, non-distended, +BS MSK: Contracted bilateral upper extremities, decreased bulk, increased tone Neurological: At baseline Skin: warm and dry  Filed Weights   08/15/21 0500 08/16/21 0332 08/17/21 0614  Weight: 52.5 kg 52.8 kg 49.3 kg     Intake/Output Summary (Last 24 hours) at 08/17/2021 0646 Last data filed at 08/17/2021 [id number] Gross per 24 hour  Intake 2372.75 ml  Output 2200 ml  Net 172.75 ml   Net IO Since Admission: 7,285.43 mL  [08/17/21 0646]  Pertinent Labs: CBC Latest Ref Rng & Units 08/17/2021 08/15/2021 08/14/2021  WBC 4.0 - 10.5 K/uL 6.7 5.5 5.5  Hemoglobin 12.0 - 15.0 g/dL 9.4(L) 9.3(L) 9.2(L)  Hematocrit 36.0 - 46.0 % 29.3(L) 28.9(L) 28.9(L)  Platelets 150 - 400 K/uL 172 242 264    CMP Latest Ref Rng & Units 08/17/2021 08/15/2021 08/14/2021  Glucose 70 - 99 mg/dL 97 89 134(H)  BUN 8 - 23 mg/dL 12 15 14   Creatinine 0.44 - 1.00 mg/dL 0.39(L) 0.38(L) 0.32(L)  Sodium 135 - 145 mmol/L 139 139 135  Potassium 3.5 - 5.1 mmol/L 4.4 4.0 3.8  Chloride 98 - 111 mmol/L 101 103 100  CO2 22 - 32 mmol/L 30 30 27   Calcium 8.9 - 10.3 mg/dL 8.6(L) 8.4(L) 8.3(L)  Total Protein 6.5 - 8.1 g/dL - - -  Total Bilirubin 0.3 - 1.2 mg/dL - - -  Alkaline Phos 38 - 126 U/L - - -  AST 15 - 41 U/L - - -  ALT 0 - 44 U/L - - -    Imaging: DG CHEST 08/10/2021 IMPRESSION: Right upper lobe pneumonia.  DG ABDOMEN 08/10/2021 IMPRESSION: 1. Feeding tube tip at the level of the gastric antrum. 2. Right upper lobe airspace disease/pneumonia.  DG CHEST 08/11/2021 IMPRESSION: 1. Support apparatus, as above. 2. Multilobar bilateral bronchopneumonia, most severe in the inferior aspect of the right upper lobe, as above.  DG ABDOMEN 08/14/2021 IMPRESSION: Feeding tube tip in the distal stomach region.  Assessment/Plan:   Active Problems:   Alzheimer's disease ([place])   Altered mental status   Severe sepsis ([place])   CAP (community  acquired pneumonia)   Pressure injury of skin   Protein-calorie malnutrition, severe   Goals of care, counseling/discussion   Dementia [facility])   Palliative care by specialist   Patient Summary: [name] is a 75 year old female with PMH of end-stage vascular dementia, seizure disorder, hemorrhagic stroke at age 30 requiring craniotomy with residual right-sided hemiparesis and aphasia, nonverbal and nonambulatory at baseline, chronic hypotension and bradycardia admitted initially for optic  shock due to aspiration pneumonia requiring vasopressor support.  Ongoing palliative care discussions regarding CODE STATUS recommended for DNR and patient reaching end-of-life.    Hx of CVA with aphasia and R hemiplegia End stage vascular dementia GOC Patient with end-stage vascular dementia, nonverbal and bedbound at baseline. Ongoing palliative care discussions with family regarding patient's condition and that she is approaching end of life. Patient's son wants his mother home if no further work up in hospital but would like to hold off on discharge until tomorrow given his work schedule. Son to transport home via private vehicle.  - Plan for discharge tomorrow  - Remove NG tube prior to discharge - Palliative care consulted, appreciate recommendations  - Discharge to home with PACE   Chronic hypotension and bradycardia Continues to have MAP's in 50-60's. She has been unresponsive to fluid resuscitation and is midodrine for hypotension. Suspect that her hypotension is sign of end of life. - Continue midodrine 10mg  q6h  RUL PNA - resolved  S/p 5 days of azithromycin and rocephin. Blood cultures negative and negative for strep pneumonia and legionella. Currently on room air   Diet: Tube Feeds IVF: None,None VTE: Heparin Code: Full PT/OT recs: None, none. TOC recs: Home Family Update: Son updated via telephone on 11/19 - plan for discharge to home on 11/20  Prior to Admission Living Arrangement: Home Anticipated Discharge Location: Home Barriers to Discharge: Family availability Dispo: Anticipated discharge to Home in 1 days pending family availability.   12/20, MD Internal Medicine Resident PGY-3 Pager# [phone]  Please contact the on call pager after 5 pm and on weekends at [phone].

## 2021-08-18 MED ORDER — LACTATED RINGERS IV BOLUS
1000.0000 mL | Freq: Once | INTRAVENOUS | Status: AC
  Administered 2021-08-18: 1000 mL via INTRAVENOUS

## 2021-08-18 MED ORDER — MIDODRINE HCL 10 MG PO TABS: 10.0000 mg | ORAL_TABLET | Freq: Four times a day (QID) | ORAL | 0 refills | Status: AC

## 2021-08-18 MED ORDER — CHLORHEXIDINE GLUCONATE 0.12 % MT SOLN
15.0000 mL | Freq: Two times a day (BID) | OROMUCOSAL | Status: DC
  Administered 2021-08-18: 15 mL via OROMUCOSAL
  Filled 2021-08-18: qty 15

## 2021-08-18 NOTE — Plan of Care (Signed)
  Problem: Education: Goal: Knowledge of General Education information will improve Description: Including pain rating scale, medication(s)/side effects and non-pharmacologic comfort measures 08/18/2021 1553 by Lyda Kalata, RN Outcome: Adequate for Discharge 08/18/2021 1553 by Lyda Kalata, RN Outcome: Adequate for Discharge   Problem: Health Behavior/Discharge Planning: Goal: Ability to manage health-related needs will improve 08/18/2021 1553 by Lyda Kalata, RN Outcome: Adequate for Discharge 08/18/2021 1553 by Lyda Kalata, RN Outcome: Adequate for Discharge   Problem: Clinical Measurements: Goal: Ability to maintain clinical measurements within normal limits will improve 08/18/2021 1553 by Lyda Kalata, RN Outcome: Adequate for Discharge 08/18/2021 1553 by Lyda Kalata, RN Outcome: Adequate for Discharge Goal: Will remain free from infection 08/18/2021 1553 by Lyda Kalata, RN Outcome: Adequate for Discharge 08/18/2021 1553 by Lyda Kalata, RN Outcome: Adequate for Discharge Goal: Diagnostic test results will improve 08/18/2021 1553 by Lyda Kalata, RN Outcome: Adequate for Discharge 08/18/2021 1553 by Lyda Kalata, RN Outcome: Adequate for Discharge Goal: Respiratory complications will improve 08/18/2021 1553 by Lyda Kalata, RN Outcome: Adequate for Discharge 08/18/2021 1553 by Lyda Kalata, RN Outcome: Adequate for Discharge Goal: Cardiovascular complication will be avoided 08/18/2021 1553 by Lyda Kalata, RN Outcome: Adequate for Discharge 08/18/2021 1553 by Lyda Kalata, RN Outcome: Adequate for Discharge   Problem: Activity: Goal: Risk for activity intolerance will decrease 08/18/2021 1553 by Lyda Kalata, RN Outcome: Adequate for Discharge 08/18/2021 1553 by Lyda Kalata, RN Outcome: Adequate for Discharge   Problem: Nutrition: Goal: Adequate nutrition will be maintained 08/18/2021 1553 by Lyda Kalata, RN Outcome: Adequate for Discharge 08/18/2021 1553 by Lyda Kalata,  RN Outcome: Adequate for Discharge   Problem: Coping: Goal: Level of anxiety will decrease 08/18/2021 1553 by Lyda Kalata, RN Outcome: Adequate for Discharge 08/18/2021 1553 by Lyda Kalata, RN Outcome: Adequate for Discharge   Problem: Elimination: Goal: Will not experience complications related to bowel motility 08/18/2021 1553 by Lyda Kalata, RN Outcome: Adequate for Discharge 08/18/2021 1553 by Lyda Kalata, RN Outcome: Adequate for Discharge Goal: Will not experience complications related to urinary retention 08/18/2021 1553 by Lyda Kalata, RN Outcome: Adequate for Discharge 08/18/2021 1553 by Lyda Kalata, RN Outcome: Adequate for Discharge   Problem: Pain Managment: Goal: General experience of comfort will improve 08/18/2021 1553 by Lyda Kalata, RN Outcome: Adequate for Discharge 08/18/2021 1553 by Lyda Kalata, RN Outcome: Adequate for Discharge   Problem: Safety: Goal: Ability to remain free from injury will improve 08/18/2021 1553 by Lyda Kalata, RN Outcome: Adequate for Discharge 08/18/2021 1553 by Lyda Kalata, RN Outcome: Adequate for Discharge   Problem: Skin Integrity: Goal: Risk for impaired skin integrity will decrease 08/18/2021 1553 by Lyda Kalata, RN Outcome: Adequate for Discharge 08/18/2021 1553 by Lyda Kalata, RN Outcome: Adequate for Discharge

## 2021-08-18 NOTE — Progress Notes (Signed)
Pts bp is 70/38- MD notified and 1,000 ml LR bolus ordered.

## 2021-08-18 NOTE — Discharge Summary (Addendum)
Name: Janet Gutierrez MRN: [id number] DOB: Aug 16, 1946 75 y.o. PCP: Patient, No Pcp Per (Inactive)  Date of Admission: 08/10/2021  8:35 AM Date of Discharge: 11.20.22 Attending Physician: Inez CatalinaMullen, Emily B, MD  Discharge Diagnosis: Septic shock secondary to right upper lobe pneumonia Chronic hypotension Chronic anemia Alzheimer's disease Protein caloric malnutrition  Discharge Medications: Allergies as of 08/18/2021   No Known Allergies      Medication List     TAKE these medications    ENSURE ENLIVE PO Take 8 fluid ounces by mouth 3 (three) times daily.   famotidine 10 MG tablet Commonly known as: PEPCID Take 10 mg by mouth daily.   levETIRAcetam 500 MG tablet Commonly known as: Keppra Take 1 tablet (500 mg total) by mouth 2 (two) times daily.   midodrine 10 MG tablet Commonly known as: PROAMATINE Take 1 tablet (10 mg total) by mouth every 6 (six) hours. What changed:  medication strength how much to take when to take this   multivitamin with minerals Tabs tablet Take 1 tablet by mouth daily.   sennosides-docusate sodium 8.6-50 MG tablet Commonly known as: SENOKOT-S Take 1 tablet by mouth in the morning and at bedtime.               Discharge Care Instructions  (From admission, onward)           Start     Ordered   08/18/21 0000  Discharge wound care:       Comments: Per wound care instruction   08/18/21 1054            Disposition and follow-up:   Jan[facility] [place]  At the hospital follow up visit please address:  1.   Septic Shock secondary to pneumonia Chronic hypotension Her MAP is running between 50-60 at baseline.  Adrenal insufficiency was ruled out.  Her blood pressure is typically unresponsive to fluid. This could be a signs of end of life. -She was started on high dose midodrine 10 mg every 6 hours  Protein caloric malnutrition Patient had a  core track here which was removed before discharge.  Family will hand feed her.  No indication for PEG tube. -Careful feeding with aspiration precaution  Chronic bradycardia Heart rate maintained in the 40s and 50s at baseline.  Goals of care  Continue goals of care conversation regarding resuscitation.  Patient remains full code per family's preference  2.  Labs / imaging needed at time of follow-up: NA  3.  Pending labs/ test needing follow-up: NA  Follow-up Appointments:  [facility]  Hospital Course by problem list:  Septic shock secondary to right upper lobe pneumonia Chronic Hypotension Chronic Bradycardia   Patient initially treated in the ICU for septic shock due to aspiration pneumonia.  She received Levophed for pressor support and was started on ceftriaxone with azithromycin.  She finished 5 days of antibiotics.  Blood cultures came back negative.  Strep pneumo and Legionella were also negative.  Patient was weaned off of pressor support and transferred back to our service on hospital day 5.   She has chronic hypotension and bradycardia was initiated on midodrine during her last hospitalization for septic shock due to aspiration pneumonia.  Her blood pressure remains low with maps in the 50s-60s.  Adrenal insufficiency was ruled out with normal cortisol level.  She is taking midodrine 10 mg every 6 hours.  Her blood pressure is minimally  responsive to fluid resuscitation.  Suspect that her hypotension is a sign of end-of-life.  Hx CVA with aphasia and R hemiplegia  End stage vascular dementia Protein caloric malnutrition Patient has been nonverbal this admission, per family, this is not her baseline but she usually becomes verbal with her family at home. Patient's family wishes for the patient to be discharged home, once medically stable, with PACE. Discussed this with the patient's son, Janet Gutierrez, and will continue to keep the family involved with the patient's care  and will aim to discharge her home.  Core track was placed here for nutrition and was removed before discharge as the patient's family provides assistance with her feedings.  No indication for PEG tube.    Patient remains a full code at this time, although it has been noted in the past by palliative that medically ineffective and futile interventions should not be performed.  However family continues to insist on aggressive measures and was transitioned back to full code this admission.  Patient will be discharged home with PACE.     HPI Patient is seen at bedside.  She is sleeping comfortably.   Discharge Exam:   BP (!) 96/30   Pulse (!) 47   Temp 97.9 F (36.6 C) (Axillary)   Resp 11   Ht 5\' 7"  (1.702 m)   Wt 47.9 kg   SpO2 99%   BMI 16.54 kg/m  Discharge exam: Physical Exam Constitutional:      General: She is not in acute distress.    Comments: Cachectic, chronically ill  HENT:     Head: Normocephalic.     Nose:     Comments: Core track in place Cardiovascular:     Rate and Rhythm: Regular rhythm. Bradycardia present.     Comments: No LE edema Pulmonary:     Effort: Pulmonary effort is normal. No respiratory distress.     Breath sounds: Normal breath sounds.  Musculoskeletal:        General: No swelling.  Skin:    General: Skin is warm.     Coloration: Skin is not jaundiced.     Pertinent Labs, Studies, and Procedures:  DG Abd 1 View  Result Date: 08/10/2021 CLINICAL DATA:  Feeding tube placement. EXAM: ABDOMEN - 1 VIEW COMPARISON:  Abdominal x-ray 10/24/2020. FINDINGS: The tip of a feeding tube is at the level of the gastric antrum. A catheter is partially looped in the body of the stomach. No dilated bowel loops are seen. There is patchy airspace opacity in the right upper lobe. IMPRESSION: 1. Feeding tube tip at the level of the gastric antrum. 2. Right upper lobe airspace disease/pneumonia. Electronically Signed   By: Ronney Asters M.D.   On: 08/10/2021 19:16    DG Chest Port 1 View  Result Date: 08/11/2021 CLINICAL DATA:  76 year old female with history of pneumonia. EXAM: PORTABLE CHEST 1 VIEW COMPARISON:  Chest x-ray 08/10/2021. FINDINGS: Nasogastric tube is in position, with tip either in the antral pre-pyloric region of the stomach, or in the proximal duodenum. Lung volumes are low. Elevation of the left hemidiaphragm. Patchy areas of interstitial prominence an ill-defined airspace disease are noted in the lungs bilaterally, most confluent in the lower aspect of the right upper lobe. No pleural effusions. No pneumothorax. No evidence of pulmonary edema. Heart size is normal. The patient is rotated to the left on today's exam, resulting in distortion of the mediastinal contours and reduced diagnostic sensitivity and specificity for mediastinal pathology. Atherosclerotic calcifications in  the thoracic aorta. IMPRESSION: 1. Support apparatus, as above. 2. Multilobar bilateral bronchopneumonia, most severe in the inferior aspect of the right upper lobe, as above. Electronically Signed   By: Trudie Reed M.D.   On: 08/11/2021 08:20   DG Chest Port 1 View  Result Date: 08/10/2021 CLINICAL DATA:  Questionable sepsis - evaluate for abnormality EXAM: PORTABLE CHEST 1 VIEW COMPARISON:  Radiograph 10/16/2020 FINDINGS: Note that the images mislabeled right versus left, as is evidenced by anatomy on multiple prior exams. There is a right upper lobe consolidation. There is no large pleural effusion or visible pneumothorax. There is no acute osseous abnormality. IMPRESSION: Right upper lobe pneumonia. Electronically Signed   By: Caprice Renshaw M.D.   On: 08/10/2021 09:45   ECHOCARDIOGRAM COMPLETE  Result Date: 08/11/2021    ECHOCARDIOGRAM REPORT   Patient Name:   SHAWNETTE AUGELLO Date of Exam: 08/11/2021 Medical Rec #:  [id number]          Height:       67.0 in Accession #:    [id number]         Weight:       112.2 lb Date of Birth:  04/27/1946         BSA:           1.582 m Patient Age:    74 years           BP:           102/49 mmHg Patient Gender: F                  HR:           66 bpm. Exam Location:  Inpatient Procedure: 2D Echo, Color Doppler and Cardiac Doppler Indications:    fever  History:        Patient has prior history of Echocardiogram examinations, most                 recent 10/26/2020.  Sonographer:    Melissa Morford RDCS (AE, PE) Referring Phys: [id number] SARAH F GROCE IMPRESSIONS  1. Mild turbulent flow in the distal LV outflow tract. Left ventricular ejection fraction, by estimation, is 60 to 65%. The left ventricle has normal function. The left ventricle has no regional wall motion abnormalities. Left ventricular diastolic parameters are consistent with Grade I diastolic dysfunction (impaired relaxation).  2. Right ventricular systolic function is normal. The right ventricular size is normal.  3. A small pericardial effusion is present. The pericardial effusion is anterior to the right ventricle. There is no evidence of cardiac tamponade.  4. The mitral valve is normal in structure. Mild mitral valve regurgitation. No evidence of mitral stenosis.  5. The aortic valve is normal in structure. Aortic valve regurgitation is not visualized. No aortic stenosis is present.  6. The inferior vena cava is normal in size with greater than 50% respiratory variability, suggesting right atrial pressure of 3 mmHg. Comparison(s): No significant change from prior study. Prior images reviewed side by side. FINDINGS  Left Ventricle: Mild turbulent flow in the distal LV outflow tract. Left ventricular ejection fraction, by estimation, is 60 to 65%. The left ventricle has normal function. The left ventricle has no regional wall motion abnormalities. The left ventricular internal cavity size was normal in size. There is no left ventricular hypertrophy. Left ventricular diastolic parameters are consistent with Grade I diastolic dysfunction (impaired relaxation). Right Ventricle: The  right ventricular size is normal. No increase in right ventricular  wall thickness. Right ventricular systolic function is normal. Left Atrium: Left atrial size was normal in size. Right Atrium: Right atrial size was normal in size. Pericardium: A small pericardial effusion is present. The pericardial effusion is anterior to the right ventricle. There is no evidence of cardiac tamponade. Mitral Valve: The mitral valve is normal in structure. Mild mitral valve regurgitation. No evidence of mitral valve stenosis. Tricuspid Valve: The tricuspid valve is normal in structure. Tricuspid valve regurgitation is mild . No evidence of tricuspid stenosis. Aortic Valve: The aortic valve is normal in structure. Aortic valve regurgitation is not visualized. No aortic stenosis is present. Pulmonic Valve: The pulmonic valve was normal in structure. Pulmonic valve regurgitation is not visualized. No evidence of pulmonic stenosis. Aorta: The aortic root is normal in size and structure. Venous: The inferior vena cava is normal in size with greater than 50% respiratory variability, suggesting right atrial pressure of 3 mmHg. IAS/Shunts: No atrial level shunt detected by color flow Doppler.  LEFT VENTRICLE PLAX 2D LVIDd:         3.50 cm   Diastology LVIDs:         2.75 cm   LV e' medial:    7.94 cm/s LV PW:         0.70 cm   LV E/e' medial:  12.5 LV IVS:        0.80 cm   LV e' lateral:   7.40 cm/s LVOT diam:     1.60 cm   LV E/e' lateral: 13.4 LV SV:         53 LV SV Index:   34 LVOT Area:     2.01 cm  RIGHT VENTRICLE RV S prime:     9.46 cm/s LEFT ATRIUM             Index        RIGHT ATRIUM           Index LA Vol (A2C):   23.4 ml 14.79 ml/m  RA Area:     12.00 cm LA Vol (A4C):   33.1 ml 20.92 ml/m  RA Volume:   23.80 ml  15.05 ml/m LA Biplane Vol: 28.9 ml 18.27 ml/m  AORTIC VALVE LVOT Vmax:   103.00 cm/s LVOT Vmean:  70.000 cm/s LVOT VTI:    0.265 m  AORTA Ao Root diam: 3.10 cm MITRAL VALVE                TRICUSPID VALVE MV Area  (PHT): 3.23 cm     TR Peak grad:   20.8 mmHg MV Decel Time: 235 msec     TR Vmax:        228.00 cm/s MV E velocity: 99.00 cm/s MV A velocity: 108.00 cm/s  SHUNTS MV E/A ratio:  0.92         Systemic VTI:  0.26 m                             Systemic Diam: 1.60 cm Candee Furbish MD Electronically signed by Candee Furbish MD Signature Date/Time: 08/11/2021/11:56:28 AM    Final    [place] EKG SITE RITE  Result Date: 08/11/2021 If Site Rite image not attached, placement could not be confirmed due to current cardiac rhythm.  [place] EKG SITE RITE  Result Date: 08/11/2021 If Site Rite image not attached, placement could not be confirmed due to current cardiac rhythm.   CBC Latest Ref Rng &  Units 08/17/2021 08/15/2021 08/14/2021  WBC 4.0 - 10.5 K/uL 6.7 5.5 5.5  Hemoglobin 12.0 - 15.0 g/dL 9.4(L) 9.3(L) 9.2(L)  Hematocrit 36.0 - 46.0 % 29.3(L) 28.9(L) 28.9(L)  Platelets 150 - 400 K/uL 172 242 264   CMP Latest Ref Rng & Units 08/17/2021 08/15/2021 08/14/2021  Glucose 70 - 99 mg/dL 97 89 134(H)  BUN 8 - 23 mg/dL 12 15 14   Creatinine 0.44 - 1.00 mg/dL 0.39(L) 0.38(L) 0.32(L)  Sodium 135 - 145 mmol/L 139 139 135  Potassium 3.5 - 5.1 mmol/L 4.4 4.0 3.8  Chloride 98 - 111 mmol/L 101 103 100  CO2 22 - 32 mmol/L 30 30 27   Calcium 8.9 - 10.3 mg/dL 8.6(L) 8.4(L) 8.3(L)  Total Protein 6.5 - 8.1 g/dL - - -  Total Bilirubin 0.3 - 1.2 mg/dL - - -  Alkaline Phos 38 - 126 U/L - - -  AST 15 - 41 U/L - - -  ALT 0 - 44 U/L - - -     Discharge Instructions: Discharge Instructions     Call MD for:  persistant nausea and vomiting   Complete by: As directed    Call MD for:  severe uncontrolled pain   Complete by: As directed    Call MD for:  temperature >100.4   Complete by: As directed    Diet - low sodium heart healthy   Complete by: As directed    Discharge instructions   Complete by: As directed    Dear Ms. Anstine's family,  Ms. Bresson was hospitalized for septic shock secondary to pneumonia.  She was admitted  to the ICU for blood pressure support and antibiotics.  Her infection has resolved.  Her blood pressure remains low at baseline.  We added midodrine 10 mg every 6 hour to help.  However, we suspect that her low blood pressure and low heart rate are signs of end-of-life.  We are glad to know that she has excellent care at home.  Please continue with hand feeding at home and be cautious of aspiration.  Please follow-up with her primary doctor at [facility].  Take care,  Dr. Alfonse Spruce   Discharge wound care:   Complete by: As directed    Per wound care instruction   Increase activity slowly   Complete by: As directed        Signed: Gaylan Gerold, DO 08/18/2021, 11:08 AM   Pager: [phone]

## 2021-08-18 NOTE — Progress Notes (Addendum)
I called and spoke to patient's son on the phone.  I tried to explain one more time patient's prognosis.  I explained in detail that without support from feeding tube and IV fluid, she would likely to pass shortly after.  I proposed other options such as inpatient hospice or home hospice.  Patient's son verbalized understanding and decided to proceed with bringing her home with [facility].  He states that he has talked to his family members and they all agreed with the plan.  He states that patient typically eats more at home.  I do not think that patient needs to remain in the hospital for intermittent fluid and feeding tube.  Her hypotension and bradycardia are signs of end-of-life.  PEG tube and feeding tube used in advanced dementia do not increase survival or quality of life per palliative team. It is not unreasonable for him to bring patient home to be around family. [facility] coordinator French Ana was notified that patient will be discharged home today.

## 2021-08-18 NOTE — TOC Transition Note (Addendum)
Transition of Care [facility]) - CM/SW Discharge Note   Patient Details  Name: Janet Gutierrez MRN: [id number] Date of Birth: May 28, 1946  Transition of Care [facility] CM/SW Contact:  Lawerance Sabal, RN Phone Number: 08/18/2021, 12:02 PM   Clinical Narrative:    Discussed DC plans and medical fragility with Dr Cyndie Chime. Per MD, he, Palliative Care Team, and son Janet Gutierrez have had multiple conversations re GOC and decision has been made by son and family to DC to home through [facility] without hospice or palliative care. MD states son has been counseled re illness trajectory.   Plan for DC to home without parenteral or intravenous fluids.  Spoke w son Janet Gutierrez, he states he and patient reside together and that she will have 24 hour support at home. She has WC, hospital bed, 3/1, Offered to set up transportation with [place], and he plans on transporting her home in their [facility].  [facility] [phone], referred answering service, [phone] opt 1, then on hold for over 10 minutes. On call nurse is suppose to call back. Received call back and informed on call nurse of DC and that son will transport home.   Janet Gutierrez states that their [facility] social worker is [facility] [phone]. Reached out to [facility] and she states that she will let the on call nurse know that the patient will be DC'd today    Final next level of care: Home/Self Care Barriers to Discharge: No Barriers Identified   Patient Goals and CMS Choice Patient states their goals for this hospitalization and ongoing recovery are:: GOC established w Palliative Team and son   Choice offered to / list presented to : NA  Discharge Placement                       Discharge Plan and Services                            [facility] Agency:  (PACE) Date [facility] Agency Contacted: 08/18/21 Time HH Agency Contacted: 1201 Representative spoke with at [facility] Agency: LM for on call nurse to call back  Social Determinants of Health (SDOH) Interventions      Readmission Risk Interventions No flowsheet data found.

## 2021-08-18 NOTE — Progress Notes (Addendum)
1500: Per notes patient supposed to be picked up by her son today at 1400. IV's removed, NGT removed, tele removed and patient awaiting pick up by son.

## 2021-08-30 ENCOUNTER — Emergency Department (HOSPITAL_COMMUNITY): Payer: Medicare (Managed Care)

## 2021-08-30 ENCOUNTER — Other Ambulatory Visit: Payer: Self-pay

## 2021-08-30 ENCOUNTER — Encounter (HOSPITAL_COMMUNITY): Payer: Self-pay

## 2021-08-30 ENCOUNTER — Inpatient Hospital Stay (HOSPITAL_COMMUNITY): Payer: Medicare (Managed Care)

## 2021-08-30 ENCOUNTER — Inpatient Hospital Stay (HOSPITAL_COMMUNITY)
Admission: EM | Admit: 2021-08-30 | Discharge: 2021-09-02 | DRG: 178 | Disposition: A | Discharge status: Home / Self Care | Payer: Medicare (Managed Care) | Source: Skilled Nursing Facility | Attending: Internal Medicine | Admitting: Internal Medicine

## 2021-08-30 DIAGNOSIS — I700 Atherosclerosis of aorta: Secondary | ICD-10-CM | POA: Diagnosis present

## 2021-08-30 DIAGNOSIS — G9340 Encephalopathy, unspecified: Secondary | ICD-10-CM | POA: Diagnosis not present

## 2021-08-30 DIAGNOSIS — E860 Dehydration: Secondary | ICD-10-CM | POA: Diagnosis present

## 2021-08-30 DIAGNOSIS — I69351 Hemiplegia and hemiparesis following cerebral infarction affecting right dominant side: Secondary | ICD-10-CM | POA: Diagnosis not present

## 2021-08-30 DIAGNOSIS — E162 Hypoglycemia, unspecified: Secondary | ICD-10-CM | POA: Diagnosis not present

## 2021-08-30 DIAGNOSIS — R509 Fever, unspecified: Secondary | ICD-10-CM | POA: Diagnosis not present

## 2021-08-30 DIAGNOSIS — G309 Alzheimer's disease, unspecified: Secondary | ICD-10-CM | POA: Diagnosis present

## 2021-08-30 DIAGNOSIS — Z8616 Personal history of COVID-19: Secondary | ICD-10-CM | POA: Diagnosis not present

## 2021-08-30 DIAGNOSIS — J690 Pneumonitis due to inhalation of food and vomit: Principal | ICD-10-CM | POA: Diagnosis present

## 2021-08-30 DIAGNOSIS — Z20822 Contact with and (suspected) exposure to covid-19: Secondary | ICD-10-CM | POA: Diagnosis present

## 2021-08-30 DIAGNOSIS — D649 Anemia, unspecified: Secondary | ICD-10-CM | POA: Diagnosis present

## 2021-08-30 DIAGNOSIS — Z4659 Encounter for fitting and adjustment of other gastrointestinal appliance and device: Secondary | ICD-10-CM

## 2021-08-30 DIAGNOSIS — R680 Hypothermia, not associated with low environmental temperature: Secondary | ICD-10-CM | POA: Diagnosis not present

## 2021-08-30 DIAGNOSIS — A419 Sepsis, unspecified organism: Secondary | ICD-10-CM | POA: Diagnosis present

## 2021-08-30 DIAGNOSIS — F0280 Dementia in other diseases classified elsewhere without behavioral disturbance: Secondary | ICD-10-CM | POA: Diagnosis present

## 2021-08-30 DIAGNOSIS — R001 Bradycardia, unspecified: Secondary | ICD-10-CM | POA: Diagnosis present

## 2021-08-30 DIAGNOSIS — I9589 Other hypotension: Secondary | ICD-10-CM | POA: Diagnosis present

## 2021-08-30 DIAGNOSIS — N390 Urinary tract infection, site not specified: Secondary | ICD-10-CM | POA: Diagnosis present

## 2021-08-30 DIAGNOSIS — R1319 Other dysphagia: Secondary | ICD-10-CM | POA: Diagnosis present

## 2021-08-30 DIAGNOSIS — B951 Streptococcus, group B, as the cause of diseases classified elsewhere: Secondary | ICD-10-CM | POA: Diagnosis present

## 2021-08-30 DIAGNOSIS — T444X6A Underdosing of predominantly alpha-adrenoreceptor agonists, initial encounter: Secondary | ICD-10-CM | POA: Diagnosis present

## 2021-08-30 DIAGNOSIS — Z87891 Personal history of nicotine dependence: Secondary | ICD-10-CM

## 2021-08-30 DIAGNOSIS — R54 Age-related physical debility: Secondary | ICD-10-CM | POA: Diagnosis present

## 2021-08-30 DIAGNOSIS — Z79899 Other long term (current) drug therapy: Secondary | ICD-10-CM | POA: Diagnosis not present

## 2021-08-30 LAB — CBC WITH DIFFERENTIAL/PLATELET
Abs Immature Granulocytes: 0.02 10*3/uL (ref 0.00–0.07)
Basophils Absolute: 0 10*3/uL (ref 0.0–0.1)
Basophils Relative: 0 %
Eosinophils Absolute: 0 10*3/uL (ref 0.0–0.5)
Eosinophils Relative: 0 %
HCT: 34.2 % — ABNORMAL LOW (ref 36.0–46.0)
Hemoglobin: 10.8 g/dL — ABNORMAL LOW (ref 12.0–15.0)
Immature Granulocytes: 0 %
Lymphocytes Relative: 6 %
Lymphs Abs: 0.5 10*3/uL — ABNORMAL LOW (ref 0.7–4.0)
MCH: 31.4 pg (ref 26.0–34.0)
MCHC: 31.6 g/dL (ref 30.0–36.0)
MCV: 99.4 fL (ref 80.0–100.0)
Monocytes Absolute: 0.5 10*3/uL (ref 0.1–1.0)
Monocytes Relative: 7 %
Neutro Abs: 6.6 10*3/uL (ref 1.7–7.7)
Neutrophils Relative %: 87 %
Platelets: 379 10*3/uL (ref 150–400)
RBC: 3.44 MIL/uL — ABNORMAL LOW (ref 3.87–5.11)
RDW: 15.8 % — ABNORMAL HIGH (ref 11.5–15.5)
WBC: 7.6 10*3/uL (ref 4.0–10.5)
nRBC: 0 % (ref 0.0–0.2)

## 2021-08-30 LAB — RESP PANEL BY RT-PCR (FLU A&B, COVID) ARPGX2
Influenza A by PCR: NEGATIVE
Influenza B by PCR: NEGATIVE
SARS Coronavirus 2 by RT PCR: NEGATIVE

## 2021-08-30 LAB — URINALYSIS, ROUTINE W REFLEX MICROSCOPIC
Bilirubin Urine: NEGATIVE
Glucose, UA: NEGATIVE mg/dL
Hgb urine dipstick: NEGATIVE
Ketones, ur: NEGATIVE mg/dL
Leukocytes,Ua: NEGATIVE
Nitrite: NEGATIVE
Protein, ur: 30 mg/dL — AB
Specific Gravity, Urine: 1.021 (ref 1.005–1.030)
pH: 7 (ref 5.0–8.0)

## 2021-08-30 LAB — APTT: aPTT: 41 seconds — ABNORMAL HIGH (ref 24–36)

## 2021-08-30 LAB — PROTIME-INR
INR: 1.2 (ref 0.8–1.2)
Prothrombin Time: 15 seconds (ref 11.4–15.2)

## 2021-08-30 LAB — COMPREHENSIVE METABOLIC PANEL
ALT: 25 U/L (ref 0–44)
AST: 29 U/L (ref 15–41)
Albumin: 2.8 g/dL — ABNORMAL LOW (ref 3.5–5.0)
Alkaline Phosphatase: 84 U/L (ref 38–126)
Anion gap: 7 (ref 5–15)
BUN: 14 mg/dL (ref 8–23)
CO2: 27 mmol/L (ref 22–32)
Calcium: 8.9 mg/dL (ref 8.9–10.3)
Chloride: 104 mmol/L (ref 98–111)
Creatinine, Ser: 0.65 mg/dL (ref 0.44–1.00)
GFR, Estimated: >60 mL/min (ref >60)
Glucose, Bld: 80 mg/dL (ref 70–99)
Potassium: 4 mmol/L (ref 3.5–5.1)
Sodium: 138 mmol/L (ref 135–145)
Total Bilirubin: 0.2 mg/dL — ABNORMAL LOW (ref 0.3–1.2)
Total Protein: 6.8 g/dL (ref 6.5–8.1)

## 2021-08-30 LAB — LACTIC ACID, PLASMA
Lactic Acid, Venous: 1.4 mmol/L (ref 0.5–1.9)
Lactic Acid, Venous: 1.7 mmol/L (ref 0.5–1.9)

## 2021-08-30 LAB — CBG MONITORING, ED: Glucose-Capillary: 69 mg/dL — ABNORMAL LOW (ref 70–99)

## 2021-08-30 MED ORDER — FAMOTIDINE 20 MG PO TABS: 10.0000 mg | ORAL_TABLET | Freq: Every morning | ORAL | Status: DC

## 2021-08-30 MED ORDER — FAMOTIDINE 20 MG PO TABS
10.0000 mg | ORAL_TABLET | Freq: Every morning | ORAL | Status: DC
  Administered 2021-08-31 – 2021-09-02 (×3): 10 mg
  Filled 2021-08-30 (×3): qty 1

## 2021-08-30 MED ORDER — POLYETHYLENE GLYCOL 3350 17 G PO PACK: 17.0000 g | PACK | Freq: Every day | ORAL | Status: DC

## 2021-08-30 MED ORDER — SODIUM CHLORIDE 0.9 % IV SOLN
500.0000 mg | INTRAVENOUS | Status: DC
  Administered 2021-08-31 – 2021-09-02 (×3): 500 mg via INTRAVENOUS
  Filled 2021-08-30 (×3): qty 500

## 2021-08-30 MED ORDER — SODIUM CHLORIDE 0.9 % IV SOLN: 500.0000 mg | INTRAVENOUS | Status: DC

## 2021-08-30 MED ORDER — ACETAMINOPHEN 650 MG RE SUPP
650.0000 mg | Freq: Once | RECTAL | Status: AC
  Administered 2021-08-30: 650 mg via RECTAL
  Filled 2021-08-30: qty 1

## 2021-08-30 MED ORDER — POLYETHYLENE GLYCOL 3350 17 G PO PACK
17.0000 g | PACK | Freq: Every day | ORAL | Status: DC
  Administered 2021-09-01 – 2021-09-02 (×2): 17 g
  Filled 2021-08-30 (×2): qty 1

## 2021-08-30 MED ORDER — LACTATED RINGERS IV BOLUS (SEPSIS)
500.0000 mL | Freq: Once | INTRAVENOUS | Status: AC
  Administered 2021-08-30: 500 mL via INTRAVENOUS

## 2021-08-30 MED ORDER — CEFTRIAXONE SODIUM 1 G IJ SOLR: 1.0000 g | INTRAMUSCULAR | Status: DC

## 2021-08-30 MED ORDER — LACTATED RINGERS IV SOLN: INTRAVENOUS | Status: DC

## 2021-08-30 MED ORDER — LEVETIRACETAM 500 MG PO TABS: 500.0000 mg | ORAL_TABLET | Freq: Two times a day (BID) | ORAL | Status: DC

## 2021-08-30 MED ORDER — ENOXAPARIN SODIUM 30 MG/0.3ML IJ SOSY
30.0000 mg | PREFILLED_SYRINGE | Freq: Every day | INTRAMUSCULAR | Status: DC
  Administered 2021-08-31 – 2021-09-02 (×3): 30 mg via SUBCUTANEOUS
  Filled 2021-08-30 (×3): qty 0.3

## 2021-08-30 MED ORDER — PIPERACILLIN-TAZOBACTAM 3.375 G IVPB 30 MIN
3.3750 g | Freq: Once | INTRAVENOUS | Status: AC
  Administered 2021-08-30: 3.375 g via INTRAVENOUS
  Filled 2021-08-30: qty 50

## 2021-08-30 MED ORDER — MIDODRINE HCL 5 MG PO TABS
10.0000 mg | ORAL_TABLET | Freq: Three times a day (TID) | ORAL | Status: DC
  Administered 2021-08-31 – 2021-09-02 (×9): 10 mg
  Filled 2021-08-30 (×9): qty 2

## 2021-08-30 MED ORDER — LACTATED RINGERS IV BOLUS (SEPSIS)
1000.0000 mL | Freq: Once | INTRAVENOUS | Status: AC
  Administered 2021-08-30: 1000 mL via INTRAVENOUS

## 2021-08-30 MED ORDER — LACTATED RINGERS IV BOLUS
500.0000 mL | Freq: Once | INTRAVENOUS | Status: AC
  Administered 2021-08-30: 500 mL via INTRAVENOUS

## 2021-08-30 MED ORDER — ENSURE ENLIVE PO LIQD
237.0000 mL | Freq: Three times a day (TID) | ORAL | Status: DC
  Administered 2021-08-31: 237 mL

## 2021-08-30 MED ORDER — LEVETIRACETAM IN NACL 500 MG/100ML IV SOLN
500.0000 mg | Freq: Two times a day (BID) | INTRAVENOUS | Status: DC
  Administered 2021-08-30 – 2021-09-02 (×6): 500 mg via INTRAVENOUS
  Filled 2021-08-30 (×7): qty 100

## 2021-08-30 MED ORDER — ENOXAPARIN SODIUM 40 MG/0.4ML IJ SOSY: 40.0000 mg | PREFILLED_SYRINGE | INTRAMUSCULAR | Status: DC

## 2021-08-30 MED ORDER — MIDODRINE HCL 5 MG PO TABS: 10.0000 mg | ORAL_TABLET | Freq: Three times a day (TID) | ORAL | Status: DC

## 2021-08-30 MED ORDER — SODIUM CHLORIDE 0.9 % IV SOLN: 1.0000 g | INTRAVENOUS | Status: DC

## 2021-08-30 MED ORDER — ENSURE ENLIVE PO LIQD
237.0000 mL | Freq: Three times a day (TID) | ORAL | Status: DC
Start: 2021-08-30 — End: 2021-08-30

## 2021-08-30 NOTE — ED Notes (Signed)
Lab working on UA

## 2021-08-30 NOTE — ED Triage Notes (Signed)
Pt from [facility] BIB EMS for rectal temp of 105 reported by the facility. Pt baseline unclear. Pt hypotensive,warm to touch and breathing swallow on arrival. BGL 69.

## 2021-08-30 NOTE — H&P (Signed)
Date: 08/30/2021               [name] MRN: [id number]  DOB: 12/24/1945 Age / Sex: 75 y.o., female   PCP: Patient, No Pcp Per (Inactive)         Medical Service: Internal Medicine Teaching Service         Attending Physician: Dr. Charissa Bash    First Contact: Dr. Carlyn Reichert Pager: [phone]  Second Contact: Dr. Merrilyn Puma Pager: [phone]       After Hours (After 5p/  First Contact Pager: [phone]  weekends / holidays): Second Contact Pager: [phone]   Chief Complaint: fever  History of Present Illness:  The patient is a 75 year old female with a PMHx of Alzheimer's dementia, CVA with right sided hemiparesis, chronic hypotension, chronic anemia, and protein-calorie malnutrition, presenting with fever.   Reportedly the [facility] and was found to have a rectal temp of 101.5. There was no reported vomiting/diarrhea. The patient's MAPs were noted to be in the 50-60's which appear to be baseline for this patient. She was recently discharged with 10 mg of midodrine q6hrs for blood pressure support, but she has reportedly been unable to tolerate this due to a lack of ability to take PO. Her med rec shows that she is taking midodrine 5 mg daily (?).    Per chart review, the patient's functional baseline is very poor and her exam has been notable for lack of response to voice or physical stimulation since at least January of this year. She is found in the same condition on our examination this evening. The patient's family reportedly says that she becomes communicative with the family at home. Her primary caregiver, Janet Gutierrez, of [facility] has been contacted and she has confirmed that the patient is a full code.   At her last hospitalization, about two weeks ago, the patient was treated in the ICU for aspiration pneumonia, requiring pressors. She moved to the IMTS after her pressor  requirement resolved. She had a core track placed for nutritional support, which was removed prior to D/C per family, their wish being to feed her by hand at home. No PEG tube was placed given that this would likely not prolong life for the patient.   Contacted the son Janet Gutierrez) at [phone]. He reports that the patient has no living spouse and identifies himself as the medical decision maker for the patient. He desires the patient to be a full code and to receive the full scope of medical care. He requests that the he be contacted in the future at [phone]. He reports that the patient was discharged home from the last hospitalization but was recently taken to [facility] so that he could take a vacation.   Meds:  Tylenol 1,000 mg q6hrs Dulcolax PRN Senokot-S Pepcid 10 mg daily Keppra 500 mg BID Midodrine 10 mg q6hrs (not taking) Midodrine 5 mg daily (reported as taking?) Multivitamin Ensure-Enlive TID  Allergies: Allergies as of 08/30/2021   (No Known Allergies)   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, now largely resolved   Dementia (HCC)     Family History: unknown  Social History: unknown  Review of Systems: Unable to be completed  Physical Exam: Blood pressure (!) 101/45, pulse 61, temperature (!) 97 F (  36.1 C), temperature source Rectal, resp. rate 15, SpO2 97 %. Physical Exam Vitals reviewed.  Constitutional:      Appearance: She is ill-appearing.     Comments: Lying on her left side, potential L arm contracture  HENT:     Mouth/Throat:     Mouth: Mucous membranes are moist.     Comments: Brownish plaque on the tongue Cardiovascular:     Rate and Rhythm: Normal rate and regular rhythm.     Pulses: Normal pulses.     Heart sounds: No murmur heard. Pulmonary:     Effort: Pulmonary effort is normal.     Breath sounds: Normal breath sounds.  Abdominal:     General:  Bowel sounds are normal.     Palpations: Abdomen is soft.     Tenderness: There is no abdominal tenderness.  Skin:    Comments: Poor skin turgor  Neurological:     Comments: Unresponsive to voice, unable to follow commands, Opens eyes periodically and scans the surroundings     EKG: personally reviewed my interpretation is sinus rhythm  CXR: personally reviewed my interpretation is no acute cardiopulmonary disease  Assessment & Plan by Problem: The patient is a 75 year old female with a PMHx of Alzheimer's dementia, CVA with right sided hemiparesis, chronic hypotension, chronic anemia, and protein-calorie malnutrition, presenting with fever, concern for aspiration PNA.   #Fever #c/f aspiration PNA Patient with fever that resolved with antipyretics, no leukocytosis or lactic acidosis, no O2 requirement, no change from baseline mental status. CXR is unimpressive for focal PNA. It is very possible the patient had a clinical or subclinical aspiration event at [facility] and due to this, developed a fever. She is s/p 1 dose of Zosyn in the ED. We will treat her for PNA going forward. We will place an NG and administer meds through tube or IV.  -Rocephin 1g daily with azithromycin 500 mg daily -F/u bcx -NG placement -NPO, aspiration precautions -Can continue Tylenol suppository -Miralax -Ensure TID   #chronic hypotension #chronic bradycardia Patient with MAPs in the 50-60's, at baseline. She has poor skin turgor but relatively moist MM. Will monitor volume status closely and bolus as needed.  -IVF for MAPs<50 -Midodrine 10 mg TID  #Protein-calorie malnutrition -RD consult  #Hx of seizures Home Keppra q12hrs IV   Dispo: Admit patient to Inpatient with expected length of stay greater than 2 midnights.  Signed: Carlyn Reichert, MD PGY-1 Pager: [phone] After 5pm on weekdays and 1pm on weekends: On Call pager: [phone]

## 2021-08-30 NOTE — Progress Notes (Signed)
I was asked to assess Janet Gutierrez for proper level of care for admission.  Per chart review, the patient's functional baseline is very poor and her exam has been notable for lack of response to voice or physical stimulation since at least January of this year.  The patient's MAPs were noted to be in the 50-60's which appear to be baseline for this patient. Latest lactic acid is 1.7 with SBP maintaining 90-100s. She has an order for a gastric tube to provide meds and nutritional support. Janet Gutierrez was recently hospitalized requiring some time in the ICU, however she does not meet ICU criteria at this time. Janet Gutierrez is currently appropriate for PCU.

## 2021-08-30 NOTE — ED Notes (Signed)
NG tube placed by this RN and verified via air  ausculation by second RN and gastric content return. Tube currently clamped, pending Xray for placement confirmation and approval for usage.

## 2021-08-30 NOTE — ED Provider Notes (Addendum)
[facility] EMERGENCY DEPARTMENT Provider Note   CSN: [id number] Arrival date & time: 08/30/21  1051     History No chief complaint on file.   Janet Gutierrez is a 75 y.o. female.  HPI  75 year old female with past medical history of Alzheimer's dementia, previous CVA with right-sided hemiparesis presents the emergency department with concern for fever.  [facility].  Report from EMS is that patient was noted to be febrile.  Unclear what her baseline is, unclear when there was an acute change.  No reported vomiting/diarrhea.  Patient is sleeping on arrival, nonverbal, again unclear if this is baseline.  No family or staff members at bedside.  Level 5 caveat.  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, now largely resolved   Dementia [facility])     Patient Active Problem List   Diagnosis Date Noted   Dementia [facility])    Palliative care by specialist    Sepsis (HCC) 08/10/2021   Goals of care, counseling/discussion    Aspiration into lower respiratory tract    Protein-calorie malnutrition, severe 10/24/2020   Pressure injury of skin 10/19/2020   Severe sepsis (HCC) 10/13/2020   COVID-19 virus infection 10/13/2020   CAP (community acquired pneumonia) 10/13/2020   Dysphagia 10/13/2020   Spell of altered consciousness    Seizures (HCC)    Altered mental status 05/01/2017   Alzheimer's disease (HCC) 11/30/2013    Past Surgical History:  Procedure Laterality Date   BRAIN SURGERY     age 6     OB History   No obstetric history on file.     Family History  Problem Relation Age of Onset   Diabetes Mother     Social History   Tobacco Use   Smoking status: Former    Types: Cigarettes    Quit date: 08/29/2009    Years since quitting: 12.0   Smokeless tobacco: Never  Substance Use Topics   Alcohol use: No   Drug use: No    Home  Medications Prior to Admission medications   Medication Sig Start Date End Date Taking? Authorizing Provider  famotidine (PEPCID) 10 MG tablet Take 10 mg by mouth daily.    [provider]  levETIRAcetam (KEPPRA) 500 MG tablet Take 1 tablet (500 mg total) by mouth 2 (two) times daily. 05/04/17   Ginger Carne, MD  midodrine (PROAMATINE) 10 MG tablet Take 1 tablet (10 mg total) by mouth every 6 (six) hours. 08/18/21 09/17/21  Doran Stabler, DO  Multiple Vitamin (MULTIVITAMIN WITH MINERALS) TABS tablet Take 1 tablet by mouth daily.    [provider]  Nutritional Supplements (ENSURE ENLIVE PO) Take 8 fluid ounces by mouth 3 (three) times daily.    [provider]  sennosides-docusate sodium (SENOKOT-S) 8.6-50 MG tablet Take 1 tablet by mouth in the morning and at bedtime.    [provider]    Allergies    Patient has no known allergies.  Review of Systems   Review of Systems  Unable to perform ROS: Mental status change   Physical Exam Updated Vital Signs There were no vitals taken for this visit.  Physical Exam Vitals and nursing note reviewed.  Constitutional:      Comments: Opens eyes to name, otherwise is laying with eyes closed, no respiratory distress  HENT:     Head: Normocephalic.  Right Ear: External ear normal.     Left Ear: External ear normal.     Mouth/Throat:     Mouth: Mucous membranes are moist.  Eyes:     Pupils: Pupils are equal, round, and reactive to light.  Cardiovascular:     Rate and Rhythm: Normal rate.     Comments: Hypotensive with SBP in the 70s Pulmonary:     Effort: Pulmonary effort is normal. No respiratory distress.     Breath sounds: No wheezing.  Abdominal:     Palpations: Abdomen is soft.  Musculoskeletal:     Cervical back: No rigidity.     Comments: Mild contracture that is documented from CVA  Skin:    General: Skin is warm.     Coloration: Skin is not pale.  Neurological:     Comments: Drowsy,  opens eyes to name, does not follow commands    ED Results / Procedures / Treatments   Labs (all labs ordered are listed, but only abnormal results are displayed) Labs Reviewed  RESP PANEL BY RT-PCR (FLU A&B, COVID) ARPGX2  CULTURE, BLOOD (ROUTINE X 2)  CULTURE, BLOOD (ROUTINE X 2)  URINE CULTURE  LACTIC ACID, PLASMA  LACTIC ACID, PLASMA  COMPREHENSIVE METABOLIC PANEL  CBC WITH DIFFERENTIAL/PLATELET  PROTIME-INR  APTT  URINALYSIS, ROUTINE W REFLEX MICROSCOPIC    EKG EKG Interpretation  Date/Time:  Friday August 30 2021 10:52:50 EST Ventricular Rate:  93 PR Interval:  142 QRS Duration: 79 QT Interval:  347 QTC Calculation: 432 R Axis:   150 Text Interpretation: Sinus rhythm Right axis deviation Low voltage, precordial leads Similar to previous Confirmed by Coralee Pesa ([id number]) on 08/30/2021 11:01:21 AM  Radiology No results found.  Procedures .Critical Care Performed by: Rozelle Logan, DO Authorized by: Rozelle Logan, DO   Critical care provider statement:    Critical care time (minutes):  45   Critical care time was exclusive of:  Separately billable procedures and treating other patients   Critical care was necessary to treat or prevent imminent or life-threatening deterioration of the following conditions:  Circulatory failure and sepsis   Critical care was time spent personally by me on the following activities:  Development of treatment plan with patient or surrogate, discussions with consultants, evaluation of patient's response to treatment, examination of patient, ordering and review of laboratory studies, ordering and review of radiographic studies, ordering and performing treatments and interventions, pulse oximetry, re-evaluation of patient's condition and review of old charts   I assumed direction of critical care for this patient from another provider in my specialty: no     Care discussed with: admitting provider     Medications Ordered in  ED Medications  lactated ringers bolus 1,000 mL (1,000 mLs Intravenous New Bag/Given 08/30/21 1059)    And  lactated ringers bolus 500 mL (500 mLs Intravenous New Bag/Given 08/30/21 1100)    ED Course  I have reviewed the triage vital signs and the nursing notes.  Pertinent labs & imaging results that were available during my care of the patient were reviewed by me and considered in my medical decision making (see chart for details).    MDM Rules/Calculators/A&P                           Level 5 caveat due to acuity and mental status change.  Patient presents the emergency department from living facility for report of fever.  Noted to be  hypotensive, SIRS criteria.  Opens eyes to name but is otherwise drowsy on exam, does not follow commands.  Hypotensive on arrival, LR started, work-up per septic protocol.  Patient noted to be full code, will attempt to contact family.  Unable to contact his son.  I was able to talk to the primary caregiver of the patient through [facility] Noreene Filbert.  She reveals that the patient is indeed full code.  They have had multiple conversations with the son in regards to realistic goals of care but he continues to keep the patient full code.  The son is currently on vacation on a cruise which is why he has not been reachable.  Patient's blood pressure has been fluid responsive, she remains with maps between 50-60.  Reading from the previous ICU notes this is baseline for the patient.  Appears she is been noncompliant with midodrine due to decreased oral intake.    Blood work shows no leukocytosis.  Lactic is normal.  No other acute findings.  Chest x-ray continues to find pneumonialike findings, concern for aspiration.  Urinalysis shows no infection, flu and COVID is negative.  I do not appreciate the patient to be in septic shock.  We have continued fluid hydration.  I spoke with on-call ICU, in regards to her soft pressures.  I do not feel pressors is  appropriate as this has been documented baseline for her in the past.  She also has no other objective findings of septic shock, fever resolved with antipyretic.  Plan to treat with IV antibiotics for ongoing aspiration pneumonia in the setting of her not tolerating oral.  We will involve social work in regards to consultation for palliative care.  We will hopefully get in contact with the son soon.  Patients evaluation and results requires admission for further treatment and care. Patient agrees with admission plan, offers no new complaints and is stable/unchanged at time of admit.  After ICU admission patient was downgraded to the internal medicine service, this will be a bounce back admission to the internal medicine.  Final Clinical Impression(s) / ED Diagnoses Final diagnoses:  None    Rx / DC Orders ED Discharge Orders     None        Rozelle Logan, DO 08/30/21 1527    Rozelle Logan, DO 08/30/21 1624

## 2021-08-30 NOTE — Hospital Course (Addendum)
12/4 Pt unable to be woken  Improved skin turgor  Exam Transmitted upper resp sounds  Intermittent tongue tremor ? Blinking, unable to open eyes urine

## 2021-08-30 NOTE — ED Notes (Signed)
Got patient undressed on the monitor did ekg shown to er provider patient is resting with call bell in reach 

## 2021-08-31 LAB — PROCALCITONIN: Procalcitonin: 0.88 ng/mL

## 2021-08-31 LAB — CBC
HCT: 30.9 % — ABNORMAL LOW (ref 36.0–46.0)
Hemoglobin: 10.1 g/dL — ABNORMAL LOW (ref 12.0–15.0)
MCH: 31.9 pg (ref 26.0–34.0)
MCHC: 32.7 g/dL (ref 30.0–36.0)
MCV: 97.5 fL (ref 80.0–100.0)
Platelets: 292 10*3/uL (ref 150–400)
RBC: 3.17 MIL/uL — ABNORMAL LOW (ref 3.87–5.11)
RDW: 15.5 % (ref 11.5–15.5)
WBC: 7.9 10*3/uL (ref 4.0–10.5)
nRBC: 0 % (ref 0.0–0.2)

## 2021-08-31 LAB — GLUCOSE, CAPILLARY
Glucose-Capillary: 105 mg/dL — ABNORMAL HIGH (ref 70–99)
Glucose-Capillary: 108 mg/dL — ABNORMAL HIGH (ref 70–99)
Glucose-Capillary: 142 mg/dL — ABNORMAL HIGH (ref 70–99)
Glucose-Capillary: 49 mg/dL — ABNORMAL LOW (ref 70–99)
Glucose-Capillary: 56 mg/dL — ABNORMAL LOW (ref 70–99)
Glucose-Capillary: 92 mg/dL (ref 70–99)

## 2021-08-31 LAB — COMPREHENSIVE METABOLIC PANEL
ALT: 22 U/L (ref 0–44)
AST: 30 U/L (ref 15–41)
Albumin: 2.2 g/dL — ABNORMAL LOW (ref 3.5–5.0)
Alkaline Phosphatase: 74 U/L (ref 38–126)
Anion gap: 9 (ref 5–15)
BUN: 12 mg/dL (ref 8–23)
CO2: 25 mmol/L (ref 22–32)
Calcium: 8.5 mg/dL — ABNORMAL LOW (ref 8.9–10.3)
Chloride: 103 mmol/L (ref 98–111)
Creatinine, Ser: 0.43 mg/dL — ABNORMAL LOW (ref 0.44–1.00)
GFR, Estimated: >60 mL/min (ref >60)
Glucose, Bld: 59 mg/dL — ABNORMAL LOW (ref 70–99)
Potassium: 3.7 mmol/L (ref 3.5–5.1)
Sodium: 137 mmol/L (ref 135–145)
Total Bilirubin: 0.5 mg/dL (ref 0.3–1.2)
Total Protein: 5.6 g/dL — ABNORMAL LOW (ref 6.5–8.1)

## 2021-08-31 LAB — URINE CULTURE: Culture: 20000 — AB

## 2021-08-31 LAB — PROTIME-INR
INR: 1.4 — ABNORMAL HIGH (ref 0.8–1.2)
Prothrombin Time: 16.9 seconds — ABNORMAL HIGH (ref 11.4–15.2)

## 2021-08-31 LAB — APTT: aPTT: 42 seconds — ABNORMAL HIGH (ref 24–36)

## 2021-08-31 MED ORDER — DEXTROSE 50 % IV SOLN
25.0000 g | INTRAVENOUS | Status: AC
  Administered 2021-08-31: 25 g via INTRAVENOUS

## 2021-08-31 MED ORDER — OSMOLITE 1.5 CAL PO LIQD
1000.0000 mL | ORAL | Status: DC
  Administered 2021-08-31 – 2021-09-01 (×2): 1000 mL
  Filled 2021-08-31 (×3): qty 1000

## 2021-08-31 MED ORDER — PROSOURCE TF PO LIQD
45.0000 mL | Freq: Every day | ORAL | Status: DC
  Administered 2021-08-31 – 2021-09-02 (×4): 45 mL
  Filled 2021-08-31 (×3): qty 45

## 2021-08-31 MED ORDER — DEXTROSE 50 % IV SOLN
INTRAVENOUS | Status: AC
  Filled 2021-08-31: qty 50

## 2021-08-31 MED ORDER — SODIUM CHLORIDE 0.9 % IV SOLN
2.0000 g | INTRAVENOUS | Status: DC
  Administered 2021-08-31 – 2021-09-02 (×3): 2 g via INTRAVENOUS
  Filled 2021-08-31 (×3): qty 20

## 2021-08-31 MED ORDER — LACTATED RINGERS IV BOLUS
1000.0000 mL | Freq: Once | INTRAVENOUS | Status: AC
  Administered 2021-08-31: 1000 mL via INTRAVENOUS

## 2021-08-31 MED ORDER — DEXTROSE 50 % IV SOLN: 1.0000 | Freq: Once | INTRAVENOUS | Status: AC

## 2021-08-31 MED ORDER — OSMOLITE 1.2 CAL PO LIQD: 1000.0000 mL | ORAL | Status: DC

## 2021-08-31 MED ORDER — DEXTROSE 50 % IV SOLN: 1.0000 | Freq: Once | INTRAVENOUS | Status: DC

## 2021-08-31 NOTE — Progress Notes (Signed)
   08/31/21 0225  Assess: MEWS Score  Temp 98.1 F (36.7 C)  BP (!) 110/51  Pulse Rate 66  ECG Heart Rate 66  Resp 18  Level of Consciousness Responds to Pain  SpO2 100 %  Patient Activity (if Appropriate) In bed  Assess: MEWS Score  MEWS Temp 0  MEWS Systolic 0  MEWS Pulse 0  MEWS RR 0  MEWS LOC 2  MEWS Score 2  MEWS Score Color Yellow  Assess: if the MEWS score is Yellow or Red  Were vital signs taken at a resting state? Yes  Focused Assessment No change from prior assessment  Early Detection of Sepsis Score *See Row Information* Low  MEWS guidelines implemented *See Row Information* Yes  Treat  MEWS Interventions Administered prn meds/treatments  Pain Scale PAINAD  Pain Score Asleep  Faces Pain Scale 0  Pain Intervention(s) Repositioned  Breathing 1  Negative Vocalization 1  Facial Expression 0  Body Language 0  Consolability 0  PAINAD Score 2  Take Vital Signs  Increase Vital Sign Frequency  Yellow: Q 2hr X 2 then Q 4hr X 2, if remains yellow, continue Q 4hrs  Escalate  MEWS: Escalate Yellow: discuss with charge nurse/RN and consider discussing with provider and RRT  Notify: Charge Nurse/RN  Name of Charge Nurse/RN Notified Dana, RN  Date Charge Nurse/RN Notified 11/23/20  Document  Patient Outcome Stabilized after interventions  Progress note created (see row info) Yes

## 2021-08-31 NOTE — Progress Notes (Signed)
Received a call from e-link stating that the patient's glucose was 59 according to blood work results.  CBG obtained from patient, and blood sugar was 49.  Hypoglycemia protocol enforced.  Amp of D50 given per protocol.  Rechecked blood sugar later, blood sugar is no now 142.  Will continue to monitor.   Darrick Grinder, RN

## 2021-08-31 NOTE — Progress Notes (Signed)
   Subjective:   Overnight events: became hypoglycemic to 49, responded well to amp of D50.  Evaluated Janet Gutierrez at bedside. She remains nonverbal, but unchanged from baseline.  Objective:  Vital signs in last 24 hours: Vitals:   08/31/21 0829 08/31/21 1127 08/31/21 1241 08/31/21 1341  BP: (!) 100/50 (!) 91/34 (!) 89/54 (!) 86/46  Pulse: 68 (!) 54 (!) 59 84  Resp: 18 10 14 14   Temp: 97.7 F (36.5 C) (!) 96.4 F (35.8 C) (!) 96.4 F (35.8 C) (!) 95.3 F (35.2 C)  TempSrc: Axillary Axillary Axillary Rectal  SpO2: 100% 100% 100% 100%   Physical Exam: General: chronically ill-appearing elderly female, lying in bed, NAD. HENT: intermittent tongue tremors noted. CV: normal rate and regular rhythm, no m/r/g. Pulm: unable to clearly hear lung sounds as patient is unable to follow commands. Extremities: nonblanching pressure wounds noted on bilateral heels. Neuro: at baseline, nonverbal, unable to follow commands on exam.  Assessment/Plan:  Principal Problem:   Sepsis [facility])  The patient is a 75 year old female with a PMHx of Alzheimer's dementia, CVA with right sided hemiparesis, chronic hypotension, chronic anemia, and protein-calorie malnutrition, presenting with fever, concern for aspiration PNA.    #Fever #c/f aspiration PNA Mental status appears to be at baseline for patient. Initial concern for sepsis, but unclear at this time if she is truly septic. She does have chronic bradycardia. CXR showed possible improving aspiration pneumonia when compared to films from prior admission. No leukocytosis since admission and lactate wnl. She did become hypothermic to 94.35F today, so will order bear hugger. Otherwise, NG tube in place for medications and nutrition. Will continue to monitor for improvement with antibiotics. -continue rocephin and azithromycin -f/u urine cx, blood cx -NPO, aspiration precautions -tylenol suppository prn for fever   #chronic hypotension #chronic  bradycardia Baseline MAPs in 50s-60s. Decreased skin turgor, likely dehydrated.  -IVF boluses for MAPs <50 -LR maintenance at 50cc/hr -Midodrine 10 mg TID per tube   #Hypoglycemic episode #Protein-calorie malnutrition Patient had hypoglycemic event overnight with CBG showing glucose of 49. Unclear if patient was symptomatic as she is nonverbal at baseline. Responded to amp of D50. Will need to monitor CBGs q2h -RD consult -osmolite and prosource per tube -trend CBGs q2h   #Hx of seizures, chronic and stable -IV keppra BID   Prior to Admission Living Arrangement: [facility] (for caregiver respite, typically stays with son) Anticipated Discharge Location: Home Barriers to Discharge: continued medical management Dispo: Anticipated discharge in approximately 2-3 day(s).   63, MD 08/31/2021, 3:42 PM Pager: [phone] After 5pm on weekdays and 1pm on weekends: On Call pager [phone]

## 2021-08-31 NOTE — Progress Notes (Signed)
Initial Nutrition Assessment  DOCUMENTATION CODES:   Not applicable  INTERVENTION:  Initiate Osmolite 1.5 cal formula @ 25 ml/hr via NGT and increase by 10 ml every 4 hours to goal rate of 45 ml/hr.   45 ml Prosource TF once daily.    Tube feeding regimen provides 1660 kcal, 79 grams of protein, and 821 ml of H2O.   Recommend obtaining new weight to fully assess weight trends.   NUTRITION DIAGNOSIS:   Inadequate oral intake related to inability to eat as evidenced by NPO status.  GOAL:   Patient will meet greater than or equal to 90% of their needs  MONITOR:   TF tolerance, Skin, Weight trends, Labs, I & O's  REASON FOR ASSESSMENT:   Consult Assessment of nutrition requirement/status  ASSESSMENT:   75 year old female with a PMHx of Alzheimer's dementia, CVA with right sided hemiparesis, chronic hypotension, chronic anemia, and protein-calorie malnutrition, presenting with fever, concern for aspiration PNA  Pt NPO, unresponsive to voice and unable to follow commands. Family desires pt to be full code. NGT placed yesterday in ED with tip of tube in stomach. Pt currently has Ensure TID ordered per tube which provides only 66% of kcal needs and 80% of protein needs. RD to order tube feeding via NGT to provide full pt nutrition needs. RD given verbal consent via MD to order TF. Noted no new weight recorded. Recommend obtaining new weight to fully assess weight trends. Unable to complete Nutrition-Focused physical exam at this time.   Labs and medications reviewed.   Diet Order:   Diet Order             Diet NPO time specified  Diet effective now                   EDUCATION NEEDS:   Not appropriate for education at this time  Skin:  Skin Assessment: Skin Integrity Issues: Skin Integrity Issues:: Stage I, Stage II Stage I: L knee, L shoulder, R ankle Stage II: L hip, L ankle, sacrum  Last BM:  Unknown  Height:   Ht Readings from Last 1 Encounters:  08/17/21  5\' 7"  (1.702 m)    Weight:   Wt Readings from Last 1 Encounters:  08/18/21 47.9 kg   BMI:  There is no height or weight on file to calculate BMI.  Estimated Nutritional Needs:   Kcal:  1600-1800  Protein:  75-85 grams  Fluid:  >/= 1.6 L/day  08/20/21, MS, RD, LDN RD pager number/after hours weekend pager number on Amion.

## 2021-09-01 LAB — CBC WITH DIFFERENTIAL/PLATELET
Abs Immature Granulocytes: 0 10*3/uL (ref 0.00–0.07)
Basophils Absolute: 0 10*3/uL (ref 0.0–0.1)
Basophils Relative: 0 %
Eosinophils Absolute: 0 10*3/uL (ref 0.0–0.5)
Eosinophils Relative: 0 %
HCT: 27.3 % — ABNORMAL LOW (ref 36.0–46.0)
Hemoglobin: 8.8 g/dL — ABNORMAL LOW (ref 12.0–15.0)
Immature Granulocytes: 0 %
Lymphocytes Relative: 23 %
Lymphs Abs: 0.9 10*3/uL (ref 0.7–4.0)
MCH: 31.2 pg (ref 26.0–34.0)
MCHC: 32.2 g/dL (ref 30.0–36.0)
MCV: 96.8 fL (ref 80.0–100.0)
Monocytes Absolute: 0.4 10*3/uL (ref 0.1–1.0)
Monocytes Relative: 11 %
Neutro Abs: 2.4 10*3/uL (ref 1.7–7.7)
Neutrophils Relative %: 66 %
Platelets: 280 10*3/uL (ref 150–400)
RBC: 2.82 MIL/uL — ABNORMAL LOW (ref 3.87–5.11)
RDW: 15.3 % (ref 11.5–15.5)
WBC: 3.8 10*3/uL — ABNORMAL LOW (ref 4.0–10.5)
nRBC: 0 % (ref 0.0–0.2)

## 2021-09-01 LAB — BASIC METABOLIC PANEL
Anion gap: 5 (ref 5–15)
BUN: 13 mg/dL (ref 8–23)
CO2: 27 mmol/L (ref 22–32)
Calcium: 8.2 mg/dL — ABNORMAL LOW (ref 8.9–10.3)
Chloride: 106 mmol/L (ref 98–111)
Creatinine, Ser: 0.41 mg/dL — ABNORMAL LOW (ref 0.44–1.00)
GFR, Estimated: >60 mL/min (ref >60)
Glucose, Bld: 81 mg/dL (ref 70–99)
Potassium: 3.6 mmol/L (ref 3.5–5.1)
Sodium: 138 mmol/L (ref 135–145)

## 2021-09-01 LAB — GLUCOSE, CAPILLARY
Glucose-Capillary: 85 mg/dL (ref 70–99)
Glucose-Capillary: 96 mg/dL (ref 70–99)
Glucose-Capillary: 104 mg/dL — ABNORMAL HIGH (ref 70–99)
Glucose-Capillary: 115 mg/dL — ABNORMAL HIGH (ref 70–99)
Glucose-Capillary: 81 mg/dL (ref 70–99)

## 2021-09-01 MED ORDER — LACTATED RINGERS IV BOLUS
1000.0000 mL | Freq: Once | INTRAVENOUS | Status: AC
  Administered 2021-09-01: 04:00:00 1000 mL via INTRAVENOUS

## 2021-09-01 NOTE — Discharge Summary (Signed)
Name: Janet Gutierrez MRN: [id number] DOB: [birth year] 75 y.o. PCP: Patient, No Pcp Per (Inactive)  Date of Admission: 08/30/2021 10:51 AM Date of Discharge:  Monday 09/02/2021  Attending Physician: Gust Rung, DO   Discharge Diagnosis: 1. Suspected Aspiration Pneumonia 2. Strep Agalactiae UTI 3. Chronic Dysphagia 4. Protein-calorie Malnutrition 5. Alzheimer's Dementia 6. Chronic Bradycardia 7. Chronic Hypotension   Discharge Medications: Allergies as of 09/02/2021   No Known Allergies      Medication List     TAKE these medications    acetaminophen 500 MG tablet Commonly known as: TYLENOL Take 1,000 mg by mouth every 6 (six) hours.   bisacodyl 10 MG suppository Commonly known as: DULCOLAX Place 10 mg rectally daily as needed (constipation not relieved by MOM). What changed: Another medication with the same name was removed. Continue taking this medication, and follow the directions you see here.   ENEMA RE Place 1 enema rectally daily as needed (constipation not relieved by bisacodyl suppository).   ENSURE ENLIVE PO Take 240 mLs by mouth 3 (three) times daily.   famotidine 10 MG tablet Commonly known as: PEPCID Take 10 mg by mouth every morning.   levETIRAcetam 500 MG tablet Commonly known as: Keppra Take 1 tablet (500 mg total) by mouth 2 (two) times daily.   midodrine 5 MG tablet Commonly known as: PROAMATINE Take 5 mg by mouth daily at 12 noon.   midodrine 10 MG tablet Commonly known as: PROAMATINE Take 1 tablet (10 mg total) by mouth every 6 (six) hours.   MILK OF MAGNESIA PO Take 30 mLs by mouth daily as needed (constipation (if no BM in 24 hours)).   multivitamin with minerals Tabs tablet Take 1 tablet by mouth every morning.   sennosides-docusate sodium 8.6-50 MG tablet Commonly known as: SENOKOT-S Take 1 tablet by mouth in the morning and at bedtime.        Disposition and follow-up:   JanetJanet Gutierrez was discharged  from [facility] in Stable condition.  At the hospital follow up visit please address:  Suspected Aspiration Pneumonia, Strep Agalactiae UTI: Patient presented from [facility] respite care for fever.  Patient was admitted for UTI and possible aspiration pneumonia developing.  Received 1 dose of Zosyn in the ED and 3 days of IV ceftriaxone and azithromycin.  Also received total of 2 L LR.  Urine culture grew strep agalactiae.  Blood cultures no growth to date.  Discharged on 12/5 to home with son Janet Gutierrez.  Chronic Bradycardia, Chronic Hypotension: Patient was discharged home midodrine 10 mg 3 times daily.  Was maintaining baseline maps 50s to 60s at discharge.  Labs / imaging needed at time of follow-up: none  Pending labs/ test needing follow-up: none  Follow-up Appointments:  Follow-up [facility], [address]. Schedule an appointment as soon as possible for a visit in 1 week(s).   Contact information: [address] [place] [address] [phone]                 Hospital Course by Problem List:  [name] is a 75 y.o. female with PMHx of Alzheimer's dementia, CVA with right sided hemiparesis, chronic dysphagia, nonverbal at baseline, chronic hypotension on midodrine, chronic bradycardia, chronic anemia, and protein-calorie malnutrition, who presented to the ED on 12/2 with fever. Patient was at [facility] temporarily for resp[facility] care prior to admission. Patient found to have suspected aspiration PNA and strep  agalactiae UTI.  #Suspected Aspiration PNA, resolving #Aspiration risk #Fever, resolved #Hypothermia Patient presented from [facility] where she was staying 4 hours by visit, when she developed fever, with rectal temp of 101.65F. Initial concern for sepsis given bradycardia, hypotension, fever, however patient's baseline MAPs usually around 50 to 60s, patient has a history of chronic  bradycardia.  Patient without leukocytosis or lactic acidosis on admission.  No new oxygen requirement and no change from baseline mental status. CXR showed possible improving aspiration pneumonia when compared to films from prior admission.  COVID and flu negative.  Suspect fever likely secondary to suspected aspiration event at [facility], also found to have UTI.  Patient was started on empiric antibiotics which were narrowed to ceftriaxone and azithromycin.  Fever improved on antipyretics, patient required Bear hugger for hypothermia during admission. Patient made NPO with NG tube in place for medications and nutrition given aspiration risk.  Urine culture grew strep agalactiae which is covered by rocephin. Blood cultures with no growth to date at discharge.  Anticipate patient will continue to have aspiration events.  Multiple providers have had goals of care discussions with family given Janet Gutierrez is a frail and chronically ill woman living in the final stages of advanced dementia, however family would like to keep patient full code, and prefers to hand feed patient at home despite aspiration risk.  Patient was treated with 1 day Zosyn and 3 days IV ceftriaxone and azithromycin total.  Patient was discharged to home with son Janet Gutierrez.    #Strep agalactiae UTI On admission, UA with rare bacteria, normal WBCs, leuk esterase and nitrite negative. Urine culture grew strep agalactiae. Patient unable to communicate sx. likely contributing to overall infectious clinical picture.  Patient was treated with total 1 day Zosyn and 3 days IV ceftriaxone and azithromycin which covered both strep agalactiae UTI and suspected possibly developing aspiration pneumonia.   #Chronic hypotension #Chronic bradycardia Baseline MAPs in 50s-60s. Poor BP response to fluid resuscitation during piror admission. Adrenal insufficiency was ruled out during previous admission. On midodrine for BP support at home. Required additional 1L  boluses for low MAPs.  Was on gentle maintenance fluids 50 cc/h and on midodrine 10 mg 3 times daily for blood pressure support.  Plan to continue this dose of midodrine on discharge.   #Hypoglycemic episode #Protein-calorie malnutrition Patient had hypoglycemic event 12/3 with glucose 49. Responded to amp of D50. CBGs stable 90s-100s. NG tube was placed for medications and nutrition at admission, on tube feeds.  Dietitian consulted this admission. Family prefers to hand feed patient at home despite aspiration risk.  NG tube was removed prior to discharge.  Anticipate patient will have further aspiration events.   #Hx of seizures, chronic and stable Home IV keppra BID was continued this admission.  No seizure-like activity noted on exam this admission.  Patient will discharge on home medications.  Subjective on day of discharge: Patient was seen and examined on rounds this morning.  Patient was resting comfortably in bed.  Eyes opened to voice, intermittently tracks examiner.  Patient followed commands to squeeze hand.  Was only intermittently following commands.  Breathing comfortably on RA.  No vocalizations.  Discharge Exam:   BP (!) 103/52   Pulse 60   Temp 98.4 F (36.9 C) (Oral)   Resp 13   Wt 51.4 kg   SpO2 100%   BMI 17.75 kg/m  Physical Exam: General: Chronically ill-appearing elderly African-American female, NAD HENT: Nontraumatic, moist mucous  membranes EYES: conjunctiva non-erythematous, no scleral icterus CV: regular rate, normal rhythm, no murmurs, rubs, gallops.  No lower extremity edema. Pulmonary: normal work of breathing on RA, transmitted upper airway sounds Abdominal: non-distended, soft, non-tender to palpation, normal BS Skin: Nonblanching pressure wounds noted on bilateral heels.  Improvement in skin turgor Neurological: MS: Awake, eyes open and intermittently tracks examiner.  Was able to squeeze hand with cue.  Only intermittently following commands.  Pupils  equal and reactive bilaterally. Psych: normal affect   Pertinent Labs, Studies, and Procedures:  CBC Latest Ref Rng & Units 09/02/2021 09/01/2021 08/31/2021  WBC 4.0 - 10.5 K/uL 4.1 3.8(L) 7.9  Hemoglobin 12.0 - 15.0 g/dL 8.4(L) 8.8(L) 10.1(L)  Hematocrit 36.0 - 46.0 % 26.3(L) 27.3(L) 30.9(L)  Platelets 150 - 400 K/uL 262 280 292   CMP Latest Ref Rng & Units 09/02/2021 09/01/2021 08/31/2021  Glucose 70 - 99 mg/dL 98 81 59(L)  BUN 8 - 23 mg/dL 9 13 12   Creatinine 0.44 - 1.00 mg/dL 0.41(L) 0.41(L) 0.43(L)  Sodium 135 - 145 mmol/L 136 138 137  Potassium 3.5 - 5.1 mmol/L 3.6 3.6 3.7  Chloride 98 - 111 mmol/L 101 106 103  CO2 22 - 32 mmol/L 28 27 25   Calcium 8.9 - 10.3 mg/dL 8.2(L) 8.2(L) 8.5(L)  Total Protein 6.5 - 8.1 g/dL - - 5.6(L)  Total Bilirubin 0.3 - 1.2 mg/dL - - 0.5  Alkaline Phos 38 - 126 U/L - - 74  AST 15 - 41 U/L - - 30  ALT 0 - 44 U/L - - 22   Urine Culture [id number] (Abnormal) Collected: 08/30/21 1126  Order Status: Completed Specimen: In/Out Cath Urine Updated: 08/31/21 1439   Specimen Description IN/OUT CATH URINE   Special Requests NONE   Culture -- Abnormal    20,000 COLONIES/mL STREPTOCOCCUS AGALACTIAE  TESTING AGAINST S. AGALACTIAE NOT ROUTINELY PERFORMED DUE TO PREDICTABILITY OF AMP/PEN/VAN SUSCEPTIBILITY.  Performed at [facility], [place] [address], [place], [place] [address]   Abnormal    Report Status 08/31/2021 FINAL    CT Head Wo Contrast  Result Date: 08/30/2021 CLINICAL DATA:  Mental status change, unknown cause. EXAM: CT HEAD WITHOUT CONTRAST TECHNIQUE: Contiguous axial images were obtained from the base of the skull through the vertex without intravenous contrast. COMPARISON:  CT examination dated October 13, 2020 FINDINGS: Brain: No evidence of acute infarction, hemorrhage, hydrocephalus, extra-axial collection or mass lesion/mass effect. Prominence of the ventricles and sulci secondary to moderate cerebral volume loss. Low-attenuation in the  periventricular and subcortical white matter presumed chronic microvascular ischemic changes. Right occipital craniectomy and encephalomalacia of the right cerebellum is unchanged. Vascular: No hyperdense vessel or unexpected calcification. Skull: Right occipital craniectomy changes. Sinuses/Orbits: No acute finding. Other: None. IMPRESSION: 1.  No acute intracranial abnormality. 2. Right occipital craniectomy and encephalomalacia of the right cerebellum, unchanged. 3.  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 Electronically Signed   By: Keane Police D.O.   On: 08/30/2021 12:34   DG Chest Port 1 View  Result Date: 08/30/2021 CLINICAL DATA:  Evaluate for infection.  Sepsis. EXAM: PORTABLE CHEST 1 VIEW COMPARISON:  08/11/2021 FINDINGS: Moderate degradation secondary to patient positioning and overlying wires and leads. Patient moderately rotated left. Midline trachea. Cardiomegaly accentuated by AP portable technique. Atherosclerosis in the transverse aorta. No large pleural effusion. Improved inferior right upper lobe airspace disease. Left lung base not well evaluated. Left upper lung clear. IMPRESSION: Moderate degradation secondary to patient positioning. Improved right upper lobe  airspace disease/pneumonia since 08/11/2021. This could represent developing scar. Cardiomegaly without congestive failure. Aortic Atherosclerosis (ICD10-I70.0). Electronically Signed   By: Abigail Miyamoto M.D.   On: 08/30/2021 11:11   DG Abd Portable 1V  Result Date: 08/30/2021 CLINICAL DATA:  Check gastric catheter placement EXAM: PORTABLE ABDOMEN - 1 VIEW COMPARISON:  08/14/2021 FINDINGS: Gastric catheter is noted within the stomach. Fecal material is noted within the colon without obstructive change. No free air is noted. IMPRESSION: Gastric catheter within the stomach. Electronically Signed   By: Inez Catalina M.D.   On: 08/30/2021 23:12    Discharge Instructions: Discharge Instructions     Call MD for:   difficulty breathing, headache or visual disturbances   Complete by: As directed    Call MD for:  persistant nausea and vomiting   Complete by: As directed    Call MD for:  redness, tenderness, or signs of infection (pain, swelling, redness, odor or green/yellow discharge around incision site)   Complete by: As directed    Call MD for:  severe uncontrolled pain   Complete by: As directed    Call MD for:  temperature >100.4   Complete by: As directed    No wound care   Complete by: As directed        Portions of this report may have been transcribed using voice recognition software. Every effort was made to ensure accuracy; however, inadvertent computerized transcription errors may be present.   Wayland Denis, MD 09/02/21,  3:19 PM Pager: [phone] Internal Medicine Resident, PGY-1 [facility]

## 2021-09-01 NOTE — Progress Notes (Addendum)
Subjective:  Overnight, patient was hypotensive with maps down to 49.  Patient received 1 L fluid bolus with improvement in maps. Patient was seen and examined on rounds.  Patient sleeping and resting comfortably. Stable vitals. At neurologic baseline, no evidence of clinical seizure activity.  Son will return from trip tomorrow with tentative plan home with home health discharge tomorrow.  Objective:  Vital signs in last 24 hours: Vitals:   08/31/21 1826 08/31/21 1855 08/31/21 2000 09/01/21 0517  BP: (!) 108/59 (!) 106/52 (!) 91/48 (!) 80/36  Pulse: (!) 56 (!) 53 (!) 48 72  Resp: 11 14 12 14   Temp:   (!) 95.5 F (35.3 C) 98.4 F (36.9 C)  TempSrc:   Rectal Rectal  SpO2: 100% 100% 100% 100%  Weight:    51.9 kg    No intake or output data in the 24 hours ending 09/01/21 0618  Physical Exam: General: Chronically ill-appearing elderly African-American female, NAD HENT: Nontraumatic, moist mucous membranes, NG tube in place EYES: conjunctiva non-erythematous, no scleral icterus CV: regular rate, normal rhythm, no murmurs, rubs, gallops.  No lower extremity edema. Pulmonary: normal work of breathing on RA, transmitted upper airway sounds Abdominal: non-distended, soft, non-tender to palpation, normal BS Skin: Nonblanching pressure wounds noted on bilateral heels.  Improvement in skin turgor Neurological: MS: Eyes remain closed, does not follow commands, nonverbal at baseline Psych: normal affect   Assessment/Plan:  Principal Problem:   Sepsis [facility])  The patient is a 75 year old female with a PMHx of Alzheimer's dementia, CVA with right sided hemiparesis, chronic hypotension, chronic anemia, and protein-calorie malnutrition, presenting with fever, concern for aspiration PNA, also found to have strep agalactiae UTI. Palliative has been consulted previously for end of life discussions, family prefers full code, feeds patient by hand at home.   #Suspected Aspiration  PNA #Hypothermia Mental status at baseline for patient. Unclear at this time if she is truly septic given her chronic bradycardia and MAPs chronically running 50-60 at baseline. Adrenal insufficiency was ruled out during previous admission. CXR showed possible improving aspiration pneumonia when compared to films from prior admission. No leukocytosis since admission and lactate wnl. Otherwise, NPO with NG tube in place for medications and nutrition.  Urine culture grew strep agalactiae which is covered by rocephin. Will continue to monitor for improvement with antibiotics. Plan: -Continue rocephin and azithromycin, day 2 abx -Blood cultures NGTD -NPO, aspiration precautions -Bear hugger for hypothermia -Tylenol suppository prn for fever  #Strep agalactiae UTI UA with rare bacteria, normal WBCs, leuk esterase and nitrite negative. Urine culture grew strep agalactiae. Patient unable to communicate sx. Current abx covers organism.  Plan: -Continue abx and fluids as above   #Chronic hypotension #Chronic bradycardia Baseline MAPs in 50s-60s. Poor BP response to fluid resuscitation during piror admission. On midodrine for BP support at home. Required additional 1L bolus overnight for low MAPs.  Plan: -IVF boluses for MAPs <50 -LR maintenance at 50cc/hr -Midodrine 10 mg TID per tube   #Hypoglycemic episode #Protein-calorie malnutrition Patient had hypoglycemic event 12/3 with glucose 49. Responded to amp of D50. CBGs stable 90s-100s. NG tube in place for medications and nutrition placed at admission. Family prefers to hand feed patient at home. Palliative has been previously involved to discuss end of life care though family wishes to pursue full code. -RD consulted, appreciate recs -osmolite and prosource per tube -trend CBGs q4hr   #Hx of seizures, chronic and stable Home IV keppra BID was continued this  admission.  Diet: Regular  VTE: Lovenox IVF: 50cc/hr LR Code: Full  Prior to  Admission Living Arrangement: [facility] for respite care, otherwise home Anticipated Discharge Location: Home with [facility] Barriers to Discharge: Medical management Dispo: Anticipated discharge in approximately 1 day(s).   Portions of this report may have been transcribed using voice recognition software. Every effort was made to ensure accuracy; however, inadvertent computerized transcription errors may be present.   Ellison Carwin, MD 09/01/21,  6:18 AM Pager: [phone] Internal Medicine Resident, PGY-1 [facility]

## 2021-09-02 DIAGNOSIS — R509 Fever, unspecified: Secondary | ICD-10-CM | POA: Diagnosis not present

## 2021-09-02 DIAGNOSIS — G9340 Encephalopathy, unspecified: Secondary | ICD-10-CM

## 2021-09-02 LAB — GLUCOSE, CAPILLARY
Glucose-Capillary: 104 mg/dL — ABNORMAL HIGH (ref 70–99)
Glucose-Capillary: 115 mg/dL — ABNORMAL HIGH (ref 70–99)
Glucose-Capillary: 83 mg/dL (ref 70–99)
Glucose-Capillary: 87 mg/dL (ref 70–99)
Glucose-Capillary: 99 mg/dL (ref 70–99)

## 2021-09-02 LAB — BASIC METABOLIC PANEL
Anion gap: 7 (ref 5–15)
BUN: 9 mg/dL (ref 8–23)
CO2: 28 mmol/L (ref 22–32)
Calcium: 8.2 mg/dL — ABNORMAL LOW (ref 8.9–10.3)
Chloride: 101 mmol/L (ref 98–111)
Creatinine, Ser: 0.41 mg/dL — ABNORMAL LOW (ref 0.44–1.00)
GFR, Estimated: >60 mL/min (ref >60)
Glucose, Bld: 98 mg/dL (ref 70–99)
Potassium: 3.6 mmol/L (ref 3.5–5.1)
Sodium: 136 mmol/L (ref 135–145)

## 2021-09-02 LAB — CBC
HCT: 26.3 % — ABNORMAL LOW (ref 36.0–46.0)
Hemoglobin: 8.4 g/dL — ABNORMAL LOW (ref 12.0–15.0)
MCH: 30.9 pg (ref 26.0–34.0)
MCHC: 31.9 g/dL (ref 30.0–36.0)
MCV: 96.7 fL (ref 80.0–100.0)
Platelets: 262 10*3/uL (ref 150–400)
RBC: 2.72 MIL/uL — ABNORMAL LOW (ref 3.87–5.11)
RDW: 15.3 % (ref 11.5–15.5)
WBC: 4.1 10*3/uL (ref 4.0–10.5)
nRBC: 0 % (ref 0.0–0.2)

## 2021-09-02 NOTE — Discharge Instructions (Signed)
You were hospitalized for suspected aspiration pneumonia and urinary tract infection.  You were treated with several days of IV antibiotics.  Thank you for allowing [place] to be part of your care.   Please follow-up with your [facility] provider 1 to 2 weeks after discharge.  We made no medication changes.

## 2021-09-02 NOTE — Progress Notes (Signed)
CSW spoke with French Ana with [facility]. French Ana confirmed she spoke with patients son and he confirmed patient will dc home with him and his spouse when medically ready. French Ana with [facility] confirmed patients son will transport patient home.CSW informed MD and CM.

## 2021-09-04 LAB — CULTURE, BLOOD (ROUTINE X 2)
Culture: NO GROWTH
Culture: NO GROWTH
Special Requests: ADEQUATE

## 2021-12-19 ENCOUNTER — Encounter: Payer: Medicare (Managed Care) | Attending: Physician Assistant | Admitting: Physician Assistant

## 2021-12-19 ENCOUNTER — Other Ambulatory Visit: Payer: Self-pay

## 2021-12-19 DIAGNOSIS — F0280 Dementia in other diseases classified elsewhere without behavioral disturbance: Secondary | ICD-10-CM | POA: Insufficient documentation

## 2021-12-19 DIAGNOSIS — L89153 Pressure ulcer of sacral region, stage 3: Secondary | ICD-10-CM | POA: Insufficient documentation

## 2021-12-19 DIAGNOSIS — L89216 Pressure-induced deep tissue damage of right hip: Secondary | ICD-10-CM | POA: Diagnosis not present

## 2021-12-19 DIAGNOSIS — S70211A Abrasion, right hip, initial encounter: Secondary | ICD-10-CM | POA: Insufficient documentation

## 2021-12-19 DIAGNOSIS — X58XXXA Exposure to other specified factors, initial encounter: Secondary | ICD-10-CM | POA: Diagnosis not present

## 2021-12-19 DIAGNOSIS — L89323 Pressure ulcer of left buttock, stage 3: Secondary | ICD-10-CM | POA: Insufficient documentation

## 2021-12-19 DIAGNOSIS — G308 Other Alzheimer's disease: Secondary | ICD-10-CM | POA: Insufficient documentation

## 2021-12-19 DIAGNOSIS — S60512A Abrasion of left hand, initial encounter: Secondary | ICD-10-CM | POA: Insufficient documentation

## 2021-12-19 NOTE — Progress Notes (Signed)
ALIYAH, HARTFORD ([id number]) ?Visit Report for 12/19/2021 ?Allergy List Details ?Patient Name: Janet Gutierrez, Janet Gutierrez ?Date of Service: 12/19/2021 12:45 PM ?Medical Record Number: [id number] ?Patient Account Number: [id number] ?Date of Birth/Sex: 1946-06-28 (76 y.o. F) ?Treating RN: Levora Dredge ?Primary Care Merlie Noga: Oval Linsey Other Clinician: ?Referring Carole Doner: Oval Linsey ?Treating Jeannette Maddy/Extender: Jeri Cos ?Weeks in Treatment: 0 ?Allergies ?Active Allergies ?No Known Drug Allergies ?Allergy Notes ?Electronic Signature(s) ?Signed: 12/19/2021 4:34:33 PM By: Levora Dredge ?Entered By: Levora Dredge on 12/19/2021 13:17:01 ?Janet Gutierrez, Janet Gutierrez ([id number]) ?-------------------------------------------------------------------------------- ?Arrival Information Details ?Patient Name: TIJAH, ALMAS ?Date of Service: 12/19/2021 12:45 PM ?Medical Record Number: [id number] ?Patient Account Number: [id number] ?Date of Birth/Sex: [birth year] (76 y.o. F) ?Treating RN: Levora Dredge ?Primary Care Justinian Miano: Oval Linsey Other Clinician: ?Referring Bernisha Verma: Oval Linsey ?Treating Jennifier Smitherman/Extender: Jeri Cos ?Weeks in Treatment: 0 ?Visit Information ?Patient Arrived: Wheel Chair ?Arrival Time: 13:01 ?Accompanied By: son ?Transfer Assistance: EasyPivot Patient Lift ?Patient Identification Verified: Yes ?Secondary Verification Process Completed: Yes ?Electronic Signature(s) ?Signed: 12/19/2021 4:34:33 PM By: Levora Dredge ?Entered By: Levora Dredge on 12/19/2021 13:15:36 ?Janet Gutierrez, Janet Gutierrez ([id number]) ?-------------------------------------------------------------------------------- ?Clinic Level of Care Assessment Details ?Patient Name: Janet Gutierrez, Janet Gutierrez ?Date of Service: 12/19/2021 12:45 PM ?Medical Record Number: [id number] ?Patient Account Number: [id number] ?Date of Birth/Sex: [birth year] (76 y.o. F) ?Treating RN: Levora Dredge ?Primary Care Holliday Sheaffer: Oval Linsey Other Clinician: ?Referring  Moksha Dorgan: Oval Linsey ?Treating Nayzeth Altman/Extender: Jeri Cos ?Weeks in Treatment: 0 ?Clinic Level of Care Assessment Items ?TOOL 2 Quantity Score ?[]  - Use when only an EandM is performed on the INITIAL visit 0 ?ASSESSMENTS - Nursing Assessment / Reassessment ?[]  - General Physical Exam (combine w/ comprehensive assessment (listed just below) when performed on new ?0 ?pt. evals) ?X- 1 25 ?Comprehensive Assessment (HX, ROS, Risk Assessments, Wounds Hx, etc.) ?ASSESSMENTS - Wound and Skin Assessment / Reassessment ?[]  - Simple Wound Assessment / Reassessment - one wound 0 ?X- 4 5 ?Complex Wound Assessment / Reassessment - multiple wounds ?[]  - 0 ?Dermatologic / Skin Assessment (not related to wound area) ?ASSESSMENTS - Ostomy and/or Continence Assessment and Care ?[]  - Incontinence Assessment and Management 0 ?[]  - 0 ?Ostomy Care Assessment and Management (repouching, etc.) ?PROCESS - Coordination of Care ?[]  - Simple Patient / Family Education for ongoing care 0 ?X- 1 20 ?Complex (extensive) Patient / Family Education for ongoing care ?X- 1 10 ?Staff obtains Consents, Records, Test Results / Process Orders ?[]  - 0 ?Staff telephones HHA, Nursing Homes / Clarify orders / etc ?[]  - 0 ?Routine Transfer to another Facility (non-emergent condition) ?[]  - 0 ?Routine Hospital Admission (non-emergent condition) ?X- 1 15 ?New Admissions / Biomedical engineer / Ordering NPWT, Apligraf, etc. ?[]  - 0 ?Emergency Hospital Admission (emergent condition) ?[]  - 0 ?Simple Discharge Coordination ?X- 1 15 ?Complex (extensive) Discharge Coordination ?PROCESS - Special Needs ?[]  - Pediatric / Minor Patient Management 0 ?[]  - 0 ?Isolation Patient Management ?[]  - 0 ?Hearing / Language / Visual special needs ?[]  - 0 ?Assessment of Community assistance (transportation, D/C planning, etc.) ?[]  - 0 ?Additional assistance / Altered mentation ?X- 1 15 ?Support Surface(s) Assessment (bed, cushion, seat, etc.) ?INTERVENTIONS - Wound  Cleansing / Measurement ?X - Wound Imaging (photographs - any number of wounds) 1 5 ?[]  - 0 ?Wound Tracing (instead of photographs) ?[]  - 0 ?Simple Wound Measurement - one wound ?X- 4 5 ?Complex Wound Measurement - multiple wounds ?Janet Gutierrez, Janet Gutierrez ([id number]) ?[]  - 0 ?Simple Wound Cleansing - one wound ?X- 4 5 ?Complex Wound Cleansing -  multiple wounds ?INTERVENTIONS - Wound Dressings ?[]  - Small Wound Dressing one or multiple wounds 0 ?X- 4 15 ?Medium Wound Dressing one or multiple wounds ?[]  - 0 ?Large Wound Dressing one or multiple wounds ?[]  - 0 ?Application of Medications - injection ?INTERVENTIONS - Miscellaneous ?[]  - External ear exam 0 ?[]  - 0 ?Specimen Collection (cultures, biopsies, blood, body fluids, etc.) ?[]  - 0 ?Specimen(s) / Culture(s) sent or taken to Lab for analysis ?[]  - 0 ?Patient Transfer (multiple staff / Civil Service fast streamer / Similar devices) ?[]  - 0 ?Simple Staple / Suture removal (25 or less) ?[]  - 0 ?Complex Staple / Suture removal (26 or more) ?[]  - 0 ?Hypo / Hyperglycemic Management (close monitor of Blood Glucose) ?[]  - 0 ?Ankle / Brachial Index (ABI) - do not check if billed separately ?Has the patient been seen at the hospital within the last three years: Yes ?Total Score: 225 ?Level Of Care: New/Established - Level ?5 ?Electronic Signature(s) ?Signed: 12/19/2021 4:34:33 PM By: Levora Dredge ?Entered By: Levora Dredge on 12/19/2021 15:43:41 ?Janet Gutierrez, Janet Gutierrez ([id number]) ?-------------------------------------------------------------------------------- ?Encounter Discharge Information Details ?Patient Name: Janet Gutierrez, Janet Gutierrez ?Date of Service: 12/19/2021 12:45 PM ?Medical Record Number: [id number] ?Patient Account Number: [id number] ?Date of Birth/Sex: 1946/09/11 (76 y.o. F) ?Treating RN: Donnamarie Poag ?Primary Care Airrion Otting: Oval Linsey Other Clinician: ?Referring Mirinda Monte: Oval Linsey ?Treating Chanse Kagel/Extender: Jeri Cos ?Weeks in Treatment: 0 ?Encounter Discharge Information  Items Post Procedure Vitals ?Discharge Condition: Stable ?Temperature (?F): 97.6 ?Ambulatory Status: Wheelchair ?Pulse (bpm): 60 ?Discharge Destination: Home ?Respiratory Rate (breaths/min): 16 ?Transportation: Other ?Blood Pressure (mmHg): 137/69 ?Accompanied By: husband ?Schedule Follow-up Appointment: Yes ?Clinical Summary of Care: ?Electronic Signature(s) ?Signed: 12/19/2021 4:22:23 PM By: Donnamarie Poag ?Entered ByDonnamarie Poag on 12/19/2021 16:22:23 ?Janet Gutierrez, Janet Gutierrez ([id number]) ?-------------------------------------------------------------------------------- ?Lower Extremity Assessment Details ?Patient Name: Janet Gutierrez, Janet Gutierrez ?Date of Service: 12/19/2021 12:45 PM ?Medical Record Number: [id number] ?Patient Account Number: [id number] ?Date of Birth/Sex: 05/03/1946 (76 y.o. F) ?Treating RN: Levora Dredge ?Primary Care Demri Poulton: Oval Linsey Other Clinician: ?Referring Beverley Allender: Oval Linsey ?Treating Valente Fosberg/Extender: Jeri Cos ?Weeks in Treatment: 0 ?Electronic Signature(s) ?Signed: 12/19/2021 4:34:33 PM By: Levora Dredge ?Entered By: Levora Dredge on 12/19/2021 13:24:27 ?Janet Gutierrez, Janet Gutierrez ([id number]) ?-------------------------------------------------------------------------------- ?Multi Wound Chart Details ?Patient Name: Janet Gutierrez, Janet Gutierrez ?Date of Service: 12/19/2021 12:45 PM ?Medical Record Number: [id number] ?Patient Account Number: [id number] ?Date of Birth/Sex: 1945-10-19 (76 y.o. 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

## 2021-12-19 NOTE — Progress Notes (Signed)
Janet Gutierrez, Janet Gutierrez ([id number]) ?Visit Report for 12/19/2021 ?Abuse Risk Screen Details ?Patient Name: Janet Gutierrez, Janet Gutierrez ?Date of Service: 12/19/2021 12:45 PM ?Medical Record Number: [id number] ?Patient Account Number: [id number] ?Date of Birth/Sex: 21-Jul-1946 (76 y.o. F) ?Treating RN: Angelina Pih ?Primary Care Kalana Yust: Diamantina Monks Other Clinician: ?Referring Iver Fehrenbach: Diamantina Monks ?Treating Zahraa Bhargava/Extender: Doughty Derry ?Weeks in Treatment: 0 ?Abuse Risk Screen Items ?Answer ?Notes ?unable to ask pt due to dementia hx ?Electronic Signature(s) ?Signed: 12/19/2021 4:34:33 PM By: Angelina Pih ?Entered By: Angelina Pih on 12/19/2021 13:21:58 ?Janet Gutierrez, Janet Gutierrez ([id number]) ?-------------------------------------------------------------------------------- ?Activities of Daily Living Details ?Patient Name: Janet Gutierrez, Janet Gutierrez ?Date of Service: 12/19/2021 12:45 PM ?Medical Record Number: [id number] ?Patient Account Number: [id number] ?Date of Birth/Sex: Nov 25, 1945 (76 y.o. F) ?Treating RN: Angelina Pih ?Primary Care Selenne Coggin: Diamantina Monks Other Clinician: ?Referring Keera Altidor: Diamantina Monks ?Treating Chantea Surace/Extender: Gorelik Derry ?Weeks in Treatment: 0 ?Activities of Daily Living Items ?Answer ?Activities of Daily Living (Please select one for each item) ?Drive Automobile Not Able ?Take Medications Need Assistance ?Use Telephone Not Able ?Care for Appearance Need Assistance ?Use Toilet Need Assistance ?Bath / Shower Need Assistance ?Dress Self Need Assistance ?Feed Self Need Assistance ?Walk Need Assistance ?Get In / Out Bed Need Assistance ?Housework Need Assistance ?Prepare Meals Need Assistance ?Handle Money Need Assistance ?Shop for Self Need Assistance ?Electronic Signature(s) ?Signed: 12/19/2021 4:34:33 PM By: Angelina Pih ?Entered By: Angelina Pih on 12/19/2021 13:22:35 ?Janet Gutierrez, Janet Gutierrez ([id number]) ?-------------------------------------------------------------------------------- ?Education  Screening Details ?Patient Name: Janet Gutierrez, Janet Gutierrez ?Date of Service: 12/19/2021 12:45 PM ?Medical Record Number: [id number] ?Patient Account Number: [id number] ?Date of Birth/Sex: 26-Mar-1946 (76 y.o. F) ?Treating RN: Angelina Pih ?Primary Care Jabreel Chimento: Diamantina Monks Other Clinician: ?Referring Aquiles Ruffini: Diamantina Monks ?Treating Syna Gad/Extender: [place]cox Derry ?Weeks in Treatment: 0 ?Primary Learner Assessed: Caregiver ?Reason Patient is not Primary Learner: pt has dementia ?Learning Preferences/Education Level/Primary Language ?Learning Preference: Explanation, Demonstration, Video, Communication Board, Printed Material ?Preferred Language: English ?Cognitive Barrier ?Language Barrier: No ?Translator Needed: No ?Memory Deficit: No ?Emotional Barrier: No ?Cultural/Religious Beliefs Affecting Medical Care: No ?Physical Barrier ?Impaired Vision: No ?Impaired Hearing: No ?Decreased Hand dexterity: No ?Knowledge/Comprehension ?Knowledge Level: High ?Comprehension Level: High ?Ability to understand written instructions: High ?Ability to understand verbal instructions: High ?Motivation ?Anxiety Level: Calm ?Cooperation: Cooperative ?Education Importance: Acknowledges Need ?Interest in Health Problems: Asks Questions ?Perception: Coherent ?Willingness to Engage in Self-Management ?High ?Activities: ?Readiness to Engage in Self-Management ?High ?Activities: ?Notes ?pt lives with son and his wife currently ?Electronic Signature(s) ?Signed: 12/19/2021 4:34:33 PM By: Angelina Pih ?Entered By: Angelina Pih on 12/19/2021 13:23:18 ?Janet Gutierrez, Janet Gutierrez ([id number]) ?-------------------------------------------------------------------------------- ?Fall Risk Assessment Details ?Patient Name: Janet Gutierrez, Janet Gutierrez ?Date of Service: 12/19/2021 12:45 PM ?Medical Record Number: [id number] ?Patient Account Number: [id number] ?Date of Birth/Sex: Oct 22, 1945 (76 y.o. F) ?Treating RN: Angelina Pih ?Primary Care Tylik Treese: Diamantina Monks  Other Clinician: ?Referring Olegario Emberson: Diamantina Monks ?Treating Wenceslaus Gist/Extender: Calder Derry ?Weeks in Treatment: 0 ?Fall Risk Assessment Items ?Have you had 2 or more falls in the last 12 monthso 0 No ?Have you had any fall that resulted in injury in the last 12 monthso 0 No ?FALLS RISK SCREEN ?History of falling - immediate or within 3 months 0 No ?Secondary diagnosis (Do you have 2 or more medical diagnoseso) 0 No ?Ambulatory aid ?None/bed rest/wheelchair/nurse 0 Yes ?Crutches/cane/walker 0 No ?Furniture 0 No ?Intravenous therapy Access/Saline/Heparin Lock 0 No ?Gait/Transferring ?Normal/ bed rest/ wheelchair 0 Yes ?Weak (short steps with or without shuffle, stooped but able to lift head while walking, may ?0 No ?seek  support from furniture) ?Impaired (short steps with shuffle, may have difficulty arising from chair, head down, impaired ?0 No ?balance) ?Mental Status ?Oriented to own ability 0 No ?Electronic Signature(s) ?Signed: 12/19/2021 4:34:33 PM By: Angelina Pih ?Entered By: Angelina Pih on 12/19/2021 13:23:37 ?Janet Gutierrez, Janet Gutierrez ([id number]) ?-------------------------------------------------------------------------------- ?Foot Assessment Details ?Patient Name: Janet Gutierrez, Janet Gutierrez ?Date of Service: 12/19/2021 12:45 PM ?Medical Record Number: [id number] ?Patient Account Number: [id number] ?Date of Birth/Sex: Apr 19, 1946 (76 y.o. F) ?Treating RN: Angelina Pih ?Primary Care Aleane Wesenberg: Diamantina Monks Other Clinician: ?Referring Aprel Egelhoff: Diamantina Monks ?Treating Najmah Carradine/Extender: [place] Derry ?Weeks in Treatment: 0 ?Foot Assessment Items ?Site Locations ?+ = Sensation present, - = Sensation absent, C = Callus, U = Ulcer ?R = Redness, W = Warmth, M = Maceration, PU = Pre-ulcerative lesion ?F = Fissure, S = Swelling, D = Dryness ?Assessment ?Right: Left: ?Other Deformity: No No ?Prior Foot Ulcer: No No ?Prior Amputation: No No ?Charcot Joint: No No ?Ambulatory Status: ?Gait: ?Notes ?not competed due to  wounds only on sacrum ?Electronic Signature(s) ?Signed: 12/19/2021 4:34:33 PM By: Angelina Pih ?Entered By: Angelina Pih on 12/19/2021 13:24:14 ?Janet Gutierrez, Janet Gutierrez ([id number]) ?-------------------------------------------------------------------------------- ?Nutrition Risk Screening Details ?Patient Name: Janet Gutierrez, ANASTOS ?Date of Service: 12/19/2021 12:45 PM ?Medical Record Number: [id number] ?Patient Account Number: [id number] ?Date of Birth/Sex: 02-05-1946 (76 y.o. F) ?Treating RN: Angelina Pih ?Primary Care Ozelle Brubacher: Diamantina Monks Other Clinician: ?Referring Nateisha Moyd: Diamantina Monks ?Treating Imaad Reuss/Extender: Hartje Derry ?Weeks in Treatment: 0 ?Height (in): ?Weight (lbs): ?Body Mass Index (BMI): ?Nutrition Risk Screening Items ?Score Screening ?NUTRITION RISK SCREEN: ?I have an illness or condition that made me change the kind and/or amount of food I eat 0 No ?I eat fewer than two meals per day 0 No ?I eat few fruits and vegetables, or milk products 0 No ?I have three or more drinks of beer, liquor or wine almost every day 0 No ?I have tooth or mouth problems that make it hard for me to eat 0 No ?I don't always have enough money to buy the food I need 0 No ?I eat alone most of the time 0 No ?I take three or more different prescribed or over-the-counter drugs a day 0 No ?Without wanting to, I have lost or gained 10 pounds in the last six months 0 No ?I am not always physically able to shop, cook and/or feed myself 0 No ?Nutrition Protocols ?Good Risk Protocol 0 No interventions needed ?Moderate Risk Protocol ?High Risk Proctocol ?Risk Level: Good Risk ?Score: 0 ?Notes ?son states patient eats well ?Electronic Signature(s) ?Signed: 12/19/2021 4:34:33 PM By: Angelina Pih ?Entered By: Angelina Pih on 12/19/2021 13:23:54 ?

## 2021-12-21 NOTE — Progress Notes (Signed)
[name], [name] ([id number]) ?Visit Report for 12/19/2021 ?Chief Complaint Document Details ?Patient Name: Janet Gutierrez, Janet Gutierrez ?Date of Service: 12/19/2021 12:45 PM ?Medical Record Number: [id number] ?Patient Account Number: [id number] ?Date of Birth/Sex: December 06, 1945 (76 y.o. F) ?Treating RN: Angelina Pih ?Primary Care Provider: Diamantina Monks Other Clinician: ?Referring Provider: Diamantina Monks ?Treating Provider/Extender: Kimmey Derry ?Weeks in Treatment: 0 ?Information Obtained from: Patient ?Chief Complaint ?Multiple pressure ulcers sacral and gluteal region and abrasions left hand right hip ?Electronic Signature(s) ?Signed: 12/19/2021 2:00:54 PM By: Lenda Kelp PA-C ?Entered By: Lenda Kelp on 12/19/2021 14:00:54 ?Janet Gutierrez ([id number]) ?-------------------------------------------------------------------------------- ?Debridement Details ?Patient Name: Janet Gutierrez, Janet Gutierrez ?Date of Service: 12/19/2021 12:45 PM ?Medical Record Number: [id number] ?Patient Account Number: [id number] ?Date of Birth/Sex: Sep 09, 1946 (76 y.o. F) ?Treating RN: Angelina Pih ?Primary Care Provider: Diamantina Monks Other Clinician: ?Referring Provider: Diamantina Monks ?Treating Provider/Extender: Yoho Derry ?Weeks in Treatment: 0 ?Debridement Performed for ?Wound #2 Left Ilium ?Assessment: ?Performed By: Physician Nelida Meuse., PA-C ?Debridement Type: Chemical/Enzymatic/Mechanical ?Agent Used: Santyl ?Level of Consciousness (Pre- ?Awake and Alert ?procedure): ?Pre-procedure Verification/Time Out ?Yes - 14:10 ?Taken: ?Bleeding: None ?Response to Treatment: Procedure was tolerated well ?Level of Consciousness (Post- ?Awake and Alert ?procedure): ?Post Debridement Measurements of Total Wound ?Length: (cm) 3.6 ?Stage: Unstageable/Unclassified ?Width: (cm) 3.5 ?Depth: (cm) 0.1 ?Volume: (cm?) 0.99 ?Character of Wound/Ulcer Post Debridement: Stable ?Post Procedure Diagnosis ?Same as Pre-procedure ?Electronic Signature(s) ?Signed:  12/19/2021 2:31:54 PM By: Angelina Pih ?Signed: 12/20/2021 5:58:01 PM By: Lenda Kelp PA-C ?Entered By: Angelina Pih on 12/19/2021 14:31:53 ?Janet Gutierrez, Janet Gutierrez ([id number]) ?-------------------------------------------------------------------------------- ?HPI Details ?Patient Name: Janet Gutierrez, Janet Gutierrez ?Date of Service: 12/19/2021 12:45 PM ?Medical Record Number: [id number] ?Patient Account Number: [id number] ?Date of Birth/Sex: 1946-02-24 (76 y.o. F) ?Treating RN: Angelina Pih ?Primary Care Provider: Diamantina Monks Other Clinician: ?Referring Provider: Diamantina Monks ?Treating Provider/Extender: Berrios Derry ?Weeks in Treatment: 0 ?History of Present Illness ?HPI Description: 12/19/2021 upon evaluation today patient presents for initial inspection here in our clinic concerning issues that she has with ?pressure ulcers and abrasions at multiple locations. She has a pressure ulcer on the sacral region and the left ilium region. With that being said ?she also has an abrasion over the left ilium location as well as an abrasion over the hand fifth digit. Subsequently she is currently not mobile and ?she was not responding to the conversation during the office visit today either. She is currently at home with her family her child and their ?spouse. They take good care of her as best they can although she does not have an air mattress I think this could be something of benefit for her ?and again we will get a send this to [facility]. ?Patient has a history of generalized weakness, Alzheimer's disease, and in general does not appear to be doing too great as far as her overall ?health status is concerned she is extremely weak and not even able to turn herself over in the bed. ?Electronic Signature(s) ?Signed: 12/20/2021 5:50:05 PM By: Lenda Kelp PA-C ?Entered By: Lenda Kelp on 12/20/2021 17:50:05 ?Janet Gutierrez, Janet Gutierrez  ([id number]) ?-------------------------------------------------------------------------------- ?Physical Exam Details ?Patient Name: Janet Gutierrez, Janet Gutierrez ?Date of Service: 12/19/2021 12:45 PM ?Medical Record Number: [id number] ?Patient Account Number: [id number] ?Date of Birth/Sex: 03/29/1946 (76 y.o. F) ?Treating RN: Angelina Pih ?Primary Care Provider: Diamantina Monks Other Clinician: ?Referring Provider: Diamantina Monks ?Treating Provider/Extender: Hipolito Derry ?Weeks in Treatment: 0 ?Constitutional ?patient is hypotensive.. pulse regular  and within target range for patient.Marland Kitchen respirations regular, non-labored and within target range for patient.. ?temperature within target range for patient.. Well-nourished and well-hydrated in no acute distress. ?Eyes ?conjunctiva clear no eyelid edema noted. pupils equal round and reactive to light and accommodation. ?Ears, Nose, Mouth, and Throat ?no gross abnormality of ear auricles or external auditory canals. normal hearing noted during conversation. mucus membranes moist. ?Respiratory ?normal breathing without difficulty. ?Musculoskeletal ?Patient unable to walk. ?Psychiatric ?Patient is not able to cooperate in decision making regarding care. Patient has dementia. patient is confused. ?Notes ?Upon inspection patient's wounds are showing signs of being quite significant in regard to the ilium on the left as well as the sacral region. 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 With that being said I do believe that the patient could benefit from an air mattress due to the fact that she ?cannot turn and reposition herself. ?Electronic Signature(s) ?Signed: 12/20/2021 5:51:02 PM By: Lenda Kelp PA-C ?Entered By: Lenda Kelp on 12/20/2021 17:51:01 ?Janet Gutierrez, Janet Gutierrez ([id number]) ?-------------------------------------------------------------------------------- ?Physician Orders Details ?Patient Name: Janet Gutierrez, Janet Gutierrez ?Date of Service: 12/19/2021 12:45 PM ?Medical Record  Number: [id number] ?Patient Account Number: [id number] ?Date of Birth/Sex: 03/22/1946 (76 y.o. F) ?Treating RN: Angelina Pih ?Primary Care Provider: Diamantina Monks Other Clinician: ?Referring Provider: Diamantina Monks ?Treating Provider/Extender: Luce Derry ?Weeks in Treatment: 0 ?Verbal / Phone Orders: No ?Diagnosis Coding ?Follow-up Appointments ?o Return Appointment in 2 weeks. ?o Nurse Visit as needed ?Bathing/ Shower/ Hygiene ?o Wash wounds with antibacterial soap and water. ?o May shower; gently cleanse wound with antibacterial soap, rinse and pat dry prior to dressing wounds ?o No tub bath. ?Off-Loading ?o Low air-loss mattress (Group 2) - patient [facility] to [facility], would like air mattress ordered for patients hospital bed ?o Turn and reposition every 2 hours ?Wound Treatment ?Wound #1 - Sacrum ?Cleanser: Normal Saline 1 x Per Day/30 Days ?Discharge Instructions: Wash your hands with soap and water. Remove old dressing, discard into plastic bag and place into trash. ?Cleanse the wound with Normal Saline prior to applying a clean dressing using gauze sponges, not tissues or cotton balls. Do not scrub ?or use excessive force. Pat dry using gauze sponges, not tissue or cotton balls. ?Cleanser: Soap and Water 1 x Per Day/30 Days ?Discharge Instructions: Gently cleanse wound with antibacterial soap, rinse and pat dry prior to dressing wounds ?Primary Dressing: Silvercel 4 1/4x 4 1/4 (in/in) 1 x Per Day/30 Days ?Discharge Instructions: Apply Silvercel 4 1/4x 4 1/4 (in/in) as instructed cut to size of wound and slightly tucked into wound at ?undermining areas ?Secondary Dressing: (SILCONE BORDER) Zetuvit Plus SILICONE BORDER Dressing 5x5 (in/in) 1 x Per Day/30 Days ?Discharge Instructions: Please do not put silicone bordered dressings under wraps. Use non-bordered dressing only. ?Wound #2 - Ilium Wound Laterality: Left ?Cleanser: Normal Saline 1 x Per Day/30 Days ?Discharge  Instructions: Wash your hands with soap and water. Remove old dressing, discard into plastic bag and place into trash. ?Cleanse the wound with Normal Saline prior to applying a clean dressing using gauze sponges, not tissues or cotton balls. Do not scrub ?or use exce

## 2022-01-02 ENCOUNTER — Ambulatory Visit: Payer: Medicare (Managed Care) | Admitting: Physician Assistant

## 2022-01-16 ENCOUNTER — Ambulatory Visit: Payer: Medicare (Managed Care) | Admitting: Physician Assistant

## 2022-01-28 ENCOUNTER — Encounter: Payer: Medicare (Managed Care) | Attending: Physician Assistant | Admitting: Physician Assistant

## 2022-01-28 DIAGNOSIS — L89153 Pressure ulcer of sacral region, stage 3: Secondary | ICD-10-CM | POA: Diagnosis present

## 2022-01-28 DIAGNOSIS — L89216 Pressure-induced deep tissue damage of right hip: Secondary | ICD-10-CM | POA: Diagnosis not present

## 2022-01-28 DIAGNOSIS — L89323 Pressure ulcer of left buttock, stage 3: Secondary | ICD-10-CM | POA: Diagnosis not present

## 2022-01-28 DIAGNOSIS — S60512A Abrasion of left hand, initial encounter: Secondary | ICD-10-CM | POA: Insufficient documentation

## 2022-01-28 DIAGNOSIS — F0280 Dementia in other diseases classified elsewhere without behavioral disturbance: Secondary | ICD-10-CM | POA: Insufficient documentation

## 2022-01-28 DIAGNOSIS — S70211A Abrasion, right hip, initial encounter: Secondary | ICD-10-CM | POA: Diagnosis not present

## 2022-01-28 DIAGNOSIS — X58XXXA Exposure to other specified factors, initial encounter: Secondary | ICD-10-CM | POA: Diagnosis not present

## 2022-01-28 DIAGNOSIS — G308 Other Alzheimer's disease: Secondary | ICD-10-CM | POA: Insufficient documentation

## 2022-01-28 NOTE — Progress Notes (Signed)
AIREONNA, ROMIG ([id number]) ?Visit Report for 01/28/2022 ?Arrival Information Details ?Patient Name: Janet Gutierrez, Janet Gutierrez ?Date of Service: 01/28/2022 1:45 PM ?Medical Record Number: [id number] ?Patient Account Number: [id number] ?Date of Birth/Sex: May 17, 1946 (76 y.o. F) ?Treating RN: Hansel Feinstein ?Primary Care Jemaine Prokop: Diamantina Monks Other Clinician: ?Referring Allee Busk: Diamantina Monks ?Treating Zarah Carbon/Extender: Warn Derry ?Weeks in Treatment: 5 ?Visit Information History Since Last Visit ?Added or deleted any medications: No ?Patient Arrived: Wheel Chair ?Had a fall or experienced change in No ?Arrival Time: 14:00 ?activities of daily living that may affect ?Accompanied By: son ?risk of falls: ?Transfer Assistance: Manual ?Hospitalized since last visit: No ?Patient Identification Verified: Yes ?Has Dressing in Place as Prescribed: Yes ?Secondary Verification Process Completed: Yes ?Pain Present Now: Unable to Respond ?Patient Requires Transmission-Based Precautions: No ?Patient Has Alerts: Yes ?Patient Alerts: [facility] ?dementia ?Electronic Signature(s) ?Signed: 01/28/2022 4:17:52 PM By: Hansel Feinstein ?Entered ByHansel Feinstein on 01/28/2022 14:09:51 ?Janet Gutierrez ([id number]) ?-------------------------------------------------------------------------------- ?Lower Extremity Assessment Details ?Patient Name: Janet Gutierrez ?Date of Service: 01/28/2022 1:45 PM ?Medical Record Number: [id number] ?Patient Account Number: [id number] ?Date of Birth/Sex: Dec 19, 1945 (76 y.o. F) ?Treating RN: Hansel Feinstein ?Primary Care Kenia Teagarden: Diamantina Monks Other Clinician: ?Referring Kaliah Haddaway: Diamantina Monks ?Treating Ohm Dentler/Extender: Brislin Derry ?Weeks in Treatment: 5 ?Electronic Signature(s) ?Signed: 01/28/2022 4:17:52 PM By: Hansel Feinstein ?Entered ByHansel Feinstein on 01/28/2022 14:27:16 ?RAYLENA, CORNWALL ([id number]) ?-------------------------------------------------------------------------------- ?Multi Wound Chart  Details ?Patient Name: Janet Gutierrez ?Date of Service: 01/28/2022 1:45 PM ?Medical Record Number: [id number] ?Patient Account Number: [id number] ?Date of Birth/Sex: 11-Oct-1945 (76 y.o. F) ?Treating RN: Hansel Feinstein ?Primary Care Zyree Traynham: Diamantina Monks Other Clinician: ?Referring Geraldy Akridge: Diamantina Monks ?Treating Donney Caraveo/Extender: Argueta Derry ?Weeks in Treatment: 5 ?Vital Signs ?Height(in): ?Pulse(bpm): 48 ?Weight(lbs): ?Blood Pressure(mmHg): 109/64 ?Body Mass Index(BMI): ?Temperature(??F): ?Respiratory Rate(breaths/min): 16 ?Photos: ?Wound Location: Sacrum Left Ilium Right, Distal Ilium ?Wounding Event: Pressure Injury Pressure Injury Skin Tear/Laceration ?Primary Etiology: Pressure Ulcer Pressure Ulcer Abrasion ?Comorbid History: History of pressure wounds, History of pressure wounds, History of pressure wounds, ?Dementia, Seizure Disorder Dementia, Seizure Disorder Dementia, Seizure Disorder ?Date Acquired: 11/27/2020 09/29/2020 11/27/2021 ?Weeks of Treatment: 5 5 5  ?Wound Status: Open Open Healed - Epithelialized ?Wound Recurrence: No No No ?Measurements L x W x D (cm) 4.7x2.5x1.5 2.6x2.5x0.5 0x0x0 ?Area (cm?) : 9.228 5.105 0 ?Volume (cm?) : 13.843 2.553 0 ?% Reduction in Area: -63.20% 48.40% 100.00% ?% Reduction in Volume: -22.40% -157.90% 100.00% ?Starting Position 1 (o'clock): 4 ?Ending Position 1 (o'clock): 12 ?Maximum Distance 1 (cm): 3.8 ?Undermining: Yes No N/A ?Classification: Category/Stage IV Unstageable/Unclassified Full Thickness Without Exposed ?Support Structures ?Exudate Amount: Large Medium None Present ?Exudate Type: Serosanguineous Serosanguineous N/A ?Exudate Color: red, brown red, brown N/A ?Granulation Amount: Medium (34-66%) Small (1-33%) None Present (0%) ?Granulation Quality: Red, Pink Pink N/A ?Necrotic Amount: Medium (34-66%) Large (67-100%) None Present (0%) ?Necrotic Tissue: Adherent Slough Eschar, Adherent Slough N/A ?Exposed Structures: ?Fat Layer (Subcutaneous Tissue): ?Fat Layer  (Subcutaneous Tissue): ?Fascia: No ?Yes Yes Fat Layer (Subcutaneous Tissue): ?Muscle: Yes No ?Tendon: No ?Muscle: No ?Joint: No ?Bone: No ?Epithelialization: None None None ?Wound Number: 5 N/A N/A ?Photos: ?N/A N/A ?Gutierrez, FINKELSTEIN ([id number]) ?Wound Location: Left Hand - 5th Digit N/A N/A ?Wounding Event: Skin Tear/Laceration N/A N/A ?Primary Etiology: Abrasion N/A N/A ?Comorbid History: History of pressure wounds, N/A N/A ?Dementia, Seizure Disorder ?Date Acquired: 11/27/2021 N/A N/A ?Weeks of Treatment: 5 N/A N/A ?Wound Status: Open N/A N/A ?Wound Recurrence: No N/A N/A ?Measurements L x W x D (  cm) 0.9x0.8x0.1 N/A N/A ?Area (cm?) : 0.565 N/A N/A ?Volume (cm?) : 0.057 N/A N/A ?% Reduction in Area: 20.10% N/A N/A ?% Reduction in Volume: 19.70% N/A N/A ?Undermining: No N/A N/A ?Classification: Full Thickness Without Exposed N/A N/A ?Support Structures ?Exudate Amount: Medium N/A N/A ?Exudate Type: Serosanguineous N/A N/A ?Exudate Color: red, brown N/A N/A ?Granulation Amount: Small (1-33%) N/A N/A ?Granulation Quality: Red N/A N/A ?Necrotic Amount: Large (67-100%) N/A N/A ?Necrotic Tissue: Adherent Slough N/A N/A ?Exposed Structures: ?Fat Layer (Subcutaneous Tissue): N/A N/A ?Yes ?Epithelialization: None N/A N/A ?Treatment Notes ?Electronic Signature(s) ?Signed: 01/28/2022 4:17:52 PM By: Donnamarie Poag ?Entered ByDonnamarie Poag on 01/28/2022 14:28:53 ?AMAKA, MANDAL ([id number]) ?-------------------------------------------------------------------------------- ?Multi-Disciplinary Care Plan Details ?Patient Name: Gutierrez, FONDA ?Date of Service: 01/28/2022 1:45 PM ?Medical Record Number: [id number] ?Patient Account Number: [id number] ?Date of Birth/Sex: Feb 07, 1946 (76 y.o. F) ?Treating RN: Donnamarie Poag ?Primary Care Morgann Woodburn: Oval Linsey Other Clinician: ?Referring Christopher Glasscock: Oval Linsey ?Treating Khailee Mick/Extender: Jeri Cos ?Weeks in Treatment: 5 ?Active Inactive ?Pressure ?Nursing Diagnoses: ?Knowledge deficit  related to causes and risk factors for pressure ulcer development ?Knowledge deficit related to management of pressures ulcers ?Potential for impaired tissue integrity related to pressure, friction, moisture, and shear ?Goals: ?Patient will remain free from development of additional pressure ulcers ?Date Initiated: 01/28/2022 ?Target Resolution Date: 03/28/2022 ?Goal Status: Active ?Interventions: ?Assess: immobility, friction, shearing, incontinence upon admission and as needed ?Assess offloading mechanisms upon admission and as needed ?Assess potential for pressure ulcer upon admission and as needed ?Notes: ?Wound/Skin Impairment ?Nursing Diagnoses: ?Impaired tissue integrity ?Knowledge deficit related to ulceration/compromised skin integrity ?Goals: ?Ulcer/skin breakdown will have a volume reduction of 30% by week 4 ?Date Initiated: 12/19/2021 ?Target Resolution Date: 01/16/2022 ?Goal Status: Active ?Ulcer/skin breakdown will have a volume reduction of 50% by week 8 ?Date Initiated: 12/19/2021 ?Target Resolution Date: 02/13/2022 ?Goal Status: Active ?Ulcer/skin breakdown will have a volume reduction of 80% by week 12 ?Date Initiated: 12/19/2021 ?Target Resolution Date: 03/13/2022 ?Goal Status: Active ?Ulcer/skin breakdown will heal within 14 weeks ?Date Initiated: 12/19/2021 ?Target Resolution Date: 03/27/2022 ?Goal Status: Active ?Interventions: ?Assess patient/caregiver ability to obtain necessary supplies ?Assess patient/caregiver ability to perform ulcer/skin care regimen upon admission and as needed ?Assess ulceration(s) every visit ?Provide education on ulcer and skin care ?Notes: ?Electronic Signature(s) ?Signed: 01/28/2022 4:17:52 PM By: Donnamarie Poag ?Entered ByDonnamarie Poag on 01/28/2022 14:28:32 ?SHAYLE, AGE ([id number]) ?-------------------------------------------------------------------------------- ?Pain Assessment Details ?Patient Name: MADI, BAJRIC ?Date of Service: 01/28/2022 1:45 PM ?Medical Record  Number: [id number] ?Patient Account Number: [id number] ?Date of Birth/Sex: 31-Jul-1946 (76 y.o. F) ?Treating RN: Donnamarie Poag ?Primary Care Caillou Minus: Oval Linsey Other Clinician: ?Referring Kassidi Elza: Claudie Fisherman

## 2022-01-28 NOTE — Progress Notes (Addendum)
KONNI, LIPSCHUTZ ([id number]) ?Visit Report for 01/28/2022 ?Chief Complaint Document Details ?Patient Name: Janet Gutierrez, Janet Gutierrez ?Date of Service: 01/28/2022 1:45 PM ?Medical Record Number: [id number] ?Patient Account Number: [id number] ?Date of Birth/Sex: 11-19-1945 (76 y.o. F) ?Treating RN: Hansel Feinstein ?Primary Care Provider: Diamantina Monks Other Clinician: ?Referring Provider: Diamantina Monks ?Treating Provider/Extender: Bottger Derry ?Weeks in Treatment: 5 ?Information Obtained from: Patient ?Chief Complaint ?Multiple pressure ulcers sacral and gluteal region and abrasions left hand right hip ?Electronic Signature(s) ?Signed: 01/28/2022 2:20:29 PM By: Lenda Kelp PA-C ?Entered By: Lenda Kelp on 01/28/2022 14:20:29 ?DAILYNN, MANERA ([id number]) ?-------------------------------------------------------------------------------- ?Debridement Details ?Patient Name: Janet Gutierrez, Janet Gutierrez ?Date of Service: 01/28/2022 1:45 PM ?Medical Record Number: [id number] ?Patient Account Number: [id number] ?Date of Birth/Sex: [birth year] (76 y.o. F) F) ?Treating RN: Hansel Feinstein ?Primary Care Provider: Diamantina Monks Other Clinician: ?Referring Provider: Diamantina Monks ?Treating Provider/Extender: Limas Derry ?Weeks in Treatment: 5 ?Debridement Performed for ?Wound #2 Left Ilium ?Assessment: ?Performed By: Physician Nelida Meuse., PA-C ?Debridement Type: Debridement ?Level of Consciousness (Pre- ?Awake and Alert ?procedure): ?Pre-procedure Verification/Time Out ?Yes - 14:42 ?Taken: ?Start Time: 14:43 ?Pain Control: Lidocaine ?Total Area Debrided (L x W): 2.6 (cm) x 2.5 (cm) = 6.5 (cm?) ?Tissue and other material ?Viable, Non-Viable, Eschar ?debrided: ?Level: Non-Viable Tissue ?Debridement Description: Selective/Open Wound ?Instrument: Forceps, Scissors ?Bleeding: Minimum ?Hemostasis Achieved: Pressure ?Response to Treatment: Procedure was tolerated well ?Level of Consciousness (Post- ?Awake and Alert ?procedure): ?Post Debridement  Measurements of Total Wound ?Length: (cm) 2.6 ?Stage: Unstageable/Unclassified ?Width: (cm) 2.5 ?Depth: (cm) 0.5 ?Volume: (cm?) 2.553 ?Character of Wound/Ulcer Post Debridement: Improved ?Post Procedure Diagnosis ?Same as Pre-procedure ?Electronic Signature(s) ?Signed: 01/28/2022 4:17:52 PM By: Hansel Feinstein ?Signed: 01/28/2022 4:38:50 PM By: Lenda Kelp PA-C ?Entered ByHansel Feinstein on 01/28/2022 14:44:25 ?JOLIE, BRANDOW ([id number]) ?-------------------------------------------------------------------------------- ?HPI Details ?Patient Name: Janet Gutierrez, Janet Gutierrez ?Date of Service: 01/28/2022 1:45 PM ?Medical Record Number: [id number] ?Patient Account Number: [id number] ?Date of Birth/Sex: 06/21/1946 (76 y.o. F) ?Treating RN: Hansel Feinstein ?Primary Care Provider: Diamantina Monks Other Clinician: ?Referring Provider: Diamantina Monks ?Treating Provider/Extender: Bunton Derry ?Weeks in Treatment: 5 ?History of Present Illness ?HPI Description: 12/19/2021 upon evaluation today patient presents for initial inspection here in our clinic concerning issues that she has with ?pressure ulcers and abrasions at multiple locations. She has a pressure ulcer on the sacral region and the left ilium region. With that being said ?she also has an abrasion over the left ilium location as well as an abrasion over the hand fifth digit. Subsequently she is currently not mobile and ?she was not responding to the conversation during the office visit today either. She is currently at home with her family her child and their ?spouse. They take good care of her as best they can although she does not have an air mattress I think this could be something of benefit for her ?and again we will get a send this to [facility]. ?Patient has a history of generalized weakness, Alzheimer's disease, and in general does not appear to be doing too great as far as her overall ?health status is concerned she is extremely weak and  not even able to turn herself over in the bed. ?01-28-2022 upon evaluation today patient appears to be doing a little bit more poorly unfortunately in regard to her wounds. She was actually under ?respite care for time and subsequently her wounds have worsened in the interim that was 4 days last week and  40 days the week before. With ?that being said the patient does not seem to be having a lot of general increased discomfort which is good news but nonetheless does continue to ?have significant wounds that definitely do need to be taken care of very carefully. She does have some eschar still noted over the left hip location ?we will continue to try to remove this today if at all possible. ?Electronic Signature(s) ?Signed: 01/28/2022 2:49:25 PM By: Worthy Keeler PA-C ?Entered By: Worthy Keeler on 01/28/2022 14:49:25 ?Janet Gutierrez, Janet Gutierrez ([id number]) ?-------------------------------------------------------------------------------- ?Physical Exam Details ?Patient Name: Janet Gutierrez, Janet Gutierrez ?Date of Service: 01/28/2022 1:45 PM ?Medical Record Number: [id number] ?Patient Account Number: [id number] ?Date of Birth/Sex: 1946/07/12 (76 y.o. F) ?Treating RN: Donnamarie Poag ?Primary Care Provider: Oval Linsey Other Clinician: ?Referring Provider: Oval Linsey ?Treating Provider/Extender: Jeri Cos ?Weeks in Treatment: 5 ?Constitutional ?Chronically ill appearing but in no apparent acute distress. ?Respiratory ?normal breathing without difficulty. ?Psychiatric ?this patient is able to make decisions and demonstrates good insight into disease process. Alert and Oriented x 3. pleasant and cooperative. ?Notes ?Upon inspection today patient's wounds actually appear to be doing a bit worse unfortunately. With that being said I do believe that this is a result ?of having been laying in respite care on the areas more than what she should have been especially in the sacral region. This has me very ?concerned as I do know this is painful  for her. Also think is can make the wound bigger in general before and has been looking fairly good in my ?opinion. ?Electronic Signature(s) ?Signed: 01/28/2022 2:50:13 PM By: Worthy Keeler PA-C ?Entered By: Worthy Keeler on 01/28/2022 14:50:13 ?Janet Gutierrez, Janet Gutierrez ([id number]) ?-------------------------------------------------------------------------------- ?Physician Orders Details ?Patient Name: Janet Gutierrez, Janet Gutierrez ?Date of Service: 01/28/2022 1:45 PM ?Medical Record Number: [id number] ?Patient Account Number: [id number] ?Date of Birth/Sex: Jul 12, 1946 (76 y.o. F) ?Treating RN: Donnamarie Poag ?Primary Care Provider: Oval Linsey Other Clinician: ?Referring Provider: Oval Linsey ?Treating Provider/Extender: Jeri Cos ?Weeks in Treatment: 5 ?Verbal / Phone Orders: No ?Diagnosis Coding ?ICD-10 Coding ?Code Description ?L89.153 Pressure ulcer of sacral region, stage 3 ?[id number] Pressure ulcer of left buttock, stage 3 ?[id number] Pressure-induced deep tissue damage of right hip ?[id number] Abrasion, right hip, initial encounter ?[id number] Abrasion of left hand, initial encounter ?G30.8 Other Alzheimer's disease ?Follow-up Appointments ?o Return Appointment in 2 weeks. - [facility] to do dress[facility] and supply dressings-requested due to transportation 2-3 weeks ?Bathing/ Shower/ Hygiene ?o Wash wounds with antibacterial soap and water. ?o May shower; gently cleanse wound with antibacterial soap, rinse and pat dry prior to dressing wounds ?o No tub bath. ?Anesthetic (Use 'Patient Medications' Section for Anesthetic Order Entry) ?o Lidocaine applied to wound bed ?Off-Loading ?o Low air-loss mattress (Group 2) - patient goes to [facility], con't air mattress ordered for patients hospital bed ?o Turn and reposition every 2 hours ?Additional Orders / Instructions ?o Follow Nutritious Diet and Increase Protein Intake ?Wound Treatment ?Wound #1 - Sacrum ?Cleanser: Normal Saline 1 x Per Day/30 Days ?Discharge  Instructions: Wash your hands with soap and water. Remove old dressing, discard into plastic bag and place into trash. ?Cleanse the wound with Normal Saline prior to applying a clean dressing using gauze sponges, not

## 2022-02-11 ENCOUNTER — Ambulatory Visit: Payer: Medicare (Managed Care) | Admitting: Physician Assistant

## 2022-02-25 ENCOUNTER — Encounter: Payer: Medicare (Managed Care) | Admitting: Physician Assistant

## 2022-02-25 DIAGNOSIS — L89153 Pressure ulcer of sacral region, stage 3: Secondary | ICD-10-CM | POA: Diagnosis not present

## 2022-02-25 NOTE — Progress Notes (Signed)
Janet, Gutierrez ([id number]) Visit Report for 02/25/2022 Arrival Information Details Patient Name: Janet Gutierrez, Janet Gutierrez. Date of Service: 02/25/2022 1:00 PM Medical Record Number: [id number] Patient Account Number: [id number] Date of Birth/Sex: Apr 23, 1946 (75 y.o. F) Treating RN: Levora Dredge Primary Care Mimi Debellis: Oval Linsey Other Clinician: Referring Otilio Groleau: Oval Linsey Treating Emslee Lopezmartinez/Extender: Skipper Cliche in Treatment: 9 Visit Information History Since Last Visit Added or deleted any medications: No Patient Arrived: Wheel Chair Any new allergies or adverse reactions: No Arrival Time: 13:26 Had a fall or experienced change in No Accompanied By: son activities of daily living that may affect Transfer Assistance: Manual risk of falls: Patient Identification Verified: Yes Hospitalized since last visit: No Secondary Verification Process Completed: Yes Has Dressing in Place as Prescribed: Yes Patient Requires Transmission-Based Precautions: No Pain Present Now: No Patient Has Alerts: Yes Patient Alerts: [facility] [facility] Electronic Signature(s) Signed: 02/25/2022 4:32:40 PM By: Levora Dredge Entered By: Levora Dredge on 02/25/2022 13:26:38 Lollie Sails ([id number]) -------------------------------------------------------------------------------- Clinic Level of Care Assessment Details Patient Name: Janet, Gutierrez Date of Service: 02/25/2022 1:00 PM Medical Record Number: [id number] Patient Account Number: [id number] Date of Birth/Sex: [birth year] (75 y.o. F) Treating RN: Levora Dredge Primary Care Shikha Bibb: Oval Linsey Other Clinician: Referring Celie Desrochers: Oval Linsey Treating Jodiann Ognibene/Extender: Skipper Cliche in Treatment: 9 Clinic Level of Care Assessment Items TOOL 4 Quantity Score []  - Use when only an EandM is performed on FOLLOW-UP visit 0 ASSESSMENTS - Nursing Assessment / Reassessment []  - Reassessment of  Co-morbidities (includes updates in patient status) 0 []  - 0 Reassessment of Adherence to Treatment Plan ASSESSMENTS - Wound and Skin Assessment / Reassessment []  - Simple Wound Assessment / Reassessment - one wound 0 []  - 0 Complex Wound Assessment / Reassessment - multiple wounds []  - 0 Dermatologic / Skin Assessment (not related to wound area) ASSESSMENTS - Focused Assessment []  - Circumferential Edema Measurements - multi extremities 0 []  - 0 Nutritional Assessment / Counseling / Intervention []  - 0 Lower Extremity Assessment (monofilament, tuning fork, pulses) []  - 0 Peripheral Arterial Disease Assessment (using hand held doppler) ASSESSMENTS - Ostomy and/or Continence Assessment and Care []  - Incontinence Assessment and Management 0 []  - 0 Ostomy Care Assessment and Management (repouching, etc.) PROCESS - Coordination of Care []  - Simple Patient / Family Education for ongoing care 0 []  - 0 Complex (extensive) Patient / Family Education for ongoing care []  - 0 Staff obtains Programmer, systems, Records, Test Results / Process Orders []  - 0 Staff telephones HHA, Nursing Homes / Clarify orders / etc []  - 0 Routine Transfer to another Facility (non-emergent condition) []  - 0 Routine Hospital Admission (non-emergent condition) []  - 0 New Admissions / Biomedical engineer / Ordering NPWT, Apligraf, etc. []  - 0 Emergency Hospital Admission (emergent condition) []  - 0 Simple Discharge Coordination []  - 0 Complex (extensive) Discharge Coordination PROCESS - Special Needs []  - Pediatric / Minor Patient Management 0 []  - 0 Isolation Patient Management []  - 0 Hearing / Language / Visual special needs []  - 0 Assessment of Community assistance (transportation, D/C planning, etc.) []  - 0 Additional assistance / Altered mentation []  - 0 Support Surface(s) Assessment (bed, cushion, seat, etc.) INTERVENTIONS - Wound Cleansing / Measurement STARLIGHT, HARRISON. ([id number]) []  -  0 Simple Wound Cleansing - one wound []  - 0 Complex Wound Cleansing - multiple wounds []  - 0 Wound Imaging (photographs - any number of wounds) []  - 0 Wound Tracing (instead of photographs) []  - 0  Simple Wound Measurement - one wound []  - 0 Complex Wound Measurement - multiple wounds INTERVENTIONS - Wound Dressings []  - Small Wound Dressing one or multiple wounds 0 []  - 0 Medium Wound Dressing one or multiple wounds []  - 0 Large Wound Dressing one or multiple wounds []  - 0 Application of Medications - topical []  - 0 Application of Medications - injection INTERVENTIONS - Miscellaneous []  - External ear exam 0 []  - 0 Specimen Collection (cultures, biopsies, blood, body fluids, etc.) []  - 0 Specimen(s) / Culture(s) sent or taken to Lab for analysis []  - 0 Patient Transfer (multiple staff / Civil Service fast streamer / Similar devices) []  - 0 Simple Staple / Suture removal (25 or less) []  - 0 Complex Staple / Suture removal (26 or more) []  - 0 Hypo / Hyperglycemic Management (close monitor of Blood Glucose) []  - 0 Ankle / Brachial Index (ABI) - do not check if billed separately []  - 0 Vital Signs Has the patient been seen at the hospital within the last three years: Yes Total Score: 0 Level Of Care: ____ Electronic Signature(s) Signed: 02/25/2022 4:32:40 PM By: Levora Dredge Entered By: Levora Dredge on 02/25/2022 15:03:51 Lollie Sails ([id number]) -------------------------------------------------------------------------------- Encounter Discharge Information Details Patient Name: Janet, Gutierrez. Date of Service: 02/25/2022 1:00 PM Medical Record Number: [id number] Patient Account Number: [id number] Date of Birth/Sex: 12/08/1945 (75 y.o. F) Treating RN: Levora Dredge Primary Care Dhalia Zingaro: Oval Linsey Other Clinician: Referring Jaleen Finch: Oval Linsey Treating Mehran Guderian/Extender: Skipper Cliche in Treatment: 9 Encounter Discharge Information Items Discharge  Condition: Stable Ambulatory Status: Wheelchair Discharge Destination: Home Transportation: Private Auto Accompanied By: son Schedule Follow-up Appointment: Yes Clinical Summary of Care: Electronic Signature(s) Signed: 02/25/2022 3:00:14 PM By: Levora Dredge Entered By: Levora Dredge on 02/25/2022 15:00:14 Lollie Sails ([id number]) -------------------------------------------------------------------------------- Lower Extremity Assessment Details Patient Name: YESENNIA, MUSCAT. Date of Service: 02/25/2022 1:00 PM Medical Record Number: [id number] Patient Account Number: [id number] Date of Birth/Sex: Sep 24, 1946 (75 y.o. F) Treating RN: Levora Dredge Primary Care Beautiful Pensyl: Oval Linsey Other Clinician: Referring Krysteena Stalker: Oval Linsey Treating Laurian Edrington/Extender: Jeri Cos Weeks in Treatment: 9 Electronic Signature(s) Signed: 02/25/2022 4:32:40 PM By: Levora Dredge Entered By: Levora Dredge on 02/25/2022 13:39:03 NOMIE, VIRGILIO ([id number]) -------------------------------------------------------------------------------- Multi Wound Chart Details Patient Name: CARYLL, SARPONG. Date of Service: 02/25/2022 1:00 PM Medical Record Number: [id number] Patient Account Number: [id number] Date of Birth/Sex: 1946-07-19 (75 y.o. F) Treating RN: Levora Dredge Primary Care Kanon Colunga: Oval Linsey Other Clinician: Referring Ruthel Martine: Oval Linsey Treating Desmund Elman/Extender: Skipper Cliche in Treatment: 9 Vital Signs Height(in): Pulse(bpm): 60 Weight(lbs): Blood Pressure(mmHg): 92/49 Body Mass Index(BMI): Temperature(F): Respiratory Rate(breaths/min): 18 Photos: Wound Location: Sacrum Left Ilium Left Hand - 5th Digit Wounding Event: Pressure Injury Pressure Injury Skin Tear/Laceration Primary Etiology: Pressure Ulcer Pressure Ulcer Abrasion Comorbid History: History of pressure wounds, History of pressure wounds, History of pressure wounds, Dementia,  Seizure Disorder Dementia, Seizure Disorder Dementia, Seizure Disorder Date Acquired: 11/27/2020 09/29/2020 11/27/2021 Weeks of Treatment: 9 9 9  Wound Status: Open Open Open Wound Recurrence: No No No Measurements L x W x D (cm) 5.5x4x0.6 2.7x2.2x0.3 0.8x0.8x0.2 Area (cm) : 17.279 4.665 0.503 Volume (cm) : 10.367 1.4 0.101 % Reduction in Area: -205.60% 52.90% 28.90% % Reduction in Volume: 8.30% -41.40% -42.30% Starting Position 1 (o'clock): 6 Ending Position 1 (o'clock): 12 Maximum Distance 1 (cm): 2.3 Undermining: Yes No No Classification: Category/Stage IV Unstageable/Unclassified Full Thickness Without Exposed Support Structures Exudate Amount: Medium Medium Medium Exudate Type: Serosanguineous Serosanguineous Serosanguineous Exudate Color:  red, brown red, brown red, brown Granulation Amount: Medium (34-66%) Small (1-33%) Small (1-33%) Granulation Quality: Red, Pink Pink Red Necrotic Amount: Medium (34-66%) Large (67-100%) Large (67-100%) Necrotic Tissue: Adherent [place] Exposed Structures: Fat Layer (Subcutaneous Tissue): Fat Layer (Subcutaneous Tissue): Fat Layer (Subcutaneous Tissue): Yes Yes Yes Muscle: Yes Epithelialization: None None None Treatment Notes Electronic Signature(s) Signed: 02/25/2022 4:32:40 PM By: Levora Dredge Entered By: Levora Dredge on 02/25/2022 13:39:15 Lollie Sails ([id number]) -------------------------------------------------------------------------------- [place] Details Patient Name: LASYA, POLCYN. Date of Service: 02/25/2022 1:00 PM Medical Record Number: [id number] Patient Account Number: [id number] Date of Birth/Sex: 07/20/1946 (75 y.o. F) Treating RN: Levora Dredge Primary Care Syd Manges: Oval Linsey Other Clinician: Referring Njeri Vicente: Oval Linsey Treating Billy Rocco/Extender: Skipper Cliche in Treatment: 9 Active Inactive Pressure Nursing  Diagnoses: Knowledge deficit related to causes and risk factors for pressure ulcer development Knowledge deficit related to management of pressures ulcers Potential for impaired tissue integrity related to pressure, friction, moisture, and shear Goals: Patient will remain free from development of additional pressure ulcers Date Initiated: 01/28/2022 Target Resolution Date: 03/28/2022 Goal Status: Active Interventions: Assess: immobility, friction, shearing, incontinence upon admission and as needed Assess offloading mechanisms upon admission and as needed Assess potential for pressure ulcer upon admission and as needed Notes: Wound/Skin Impairment Nursing Diagnoses: Impaired tissue integrity Knowledge deficit related to ulceration/compromised skin integrity Goals: Ulcer/skin breakdown will have a volume reduction of 30% by week 4 Date Initiated: 12/19/2021 Target Resolution Date: 01/16/2022 Goal Status: Active Ulcer/skin breakdown will have a volume reduction of 50% by week 8 Date Initiated: 12/19/2021 Target Resolution Date: 02/13/2022 Goal Status: Active Ulcer/skin breakdown will have a volume reduction of 80% by week 12 Date Initiated: 12/19/2021 Target Resolution Date: 03/13/2022 Goal Status: Active Ulcer/skin breakdown will heal within 14 weeks Date Initiated: 12/19/2021 Target Resolution Date: 03/27/2022 Goal Status: Active Interventions: Assess patient/caregiver ability to obtain necessary supplies Assess patient/caregiver ability to perform ulcer/skin care regimen upon admission and as needed Assess ulceration(s) every visit Provide education on ulcer and skin care Notes: Electronic Signature(s) Signed: 02/25/2022 4:32:40 PM By: Levora Dredge Entered By: Levora Dredge on 02/25/2022 13:39:08 Lollie Sails ([id number]) -------------------------------------------------------------------------------- Pain Assessment Details Patient Name: ARANYA, MORELES Date of Service:  02/25/2022 1:00 PM Medical Record Number: [id number] Patient Account Number: [id number] Date of Birth/Sex: Jan 12, 1946 (75 y.o. F) Treating RN: Levora Dredge Primary Care Richele Strand: Oval Linsey Other Clinician: Referring Philander Ake: Oval Linsey Treating Eion Timbrook/Extender: Skipper Cliche in Treatment: 9 Active Problems Location of Pain Severity and Description of Pain Patient Has Paino No Site Locations Rate the pain. Current Pain Level: 0 Pain Management and Medication Current Pain Management: Electronic Signature(s) Signed: 02/25/2022 4:32:40 PM By: Levora Dredge Entered By: Levora Dredge on 02/25/2022 13:27:54 Lollie Sails ([id number]) -------------------------------------------------------------------------------- Patient/Caregiver Education Details Patient Name: DWIGHT, ROSEBROCK Date of Service: 02/25/2022 1:00 PM Medical Record Number: [id number] Patient Account Number: [id number] Date of Birth/Gender: December 22, 1945 (76 y.o. F) Treating RN: Levora Dredge Primary Care Physician: Oval Linsey Other Clinician: Referring Physician: Oval Linsey Treating Physician/Extender: Skipper Cliche in Treatment: 9 Education Assessment Education Provided To: Caregiver Education Topics Provided Wound/Skin Impairment: Handouts: Caring for Your Ulcer Methods: Explain/Verbal Responses: State content correctly Electronic Signature(s) Signed: 02/25/2022 4:32:40 PM By: Levora Dredge Entered By: Levora Dredge on 02/25/2022 14:59:39 Nakyra, Dorrance [address] ([id number]) -------------------------------------------------------------------------------- Wound Assessment Details Patient Name: LAMONT, RUNNER. Date of Service: 02/25/2022 1:00 PM Medical Record Number: [id number] Patient Account Number: [id number] Date of  Birth/Sex: 08-12-1946 (76 y.o. F) Treating RN: Levora Dredge Primary Care Luverna Degenhart: Oval Linsey Other Clinician: Referring Takima Encina: Oval Linsey Treating Steaven Wholey/Extender: Skipper Cliche in Treatment: 9 Wound Status Wound Number: 1 Primary Etiology: Pressure Ulcer Wound Location: Sacrum Wound Status: Open Wounding Event: Pressure Injury Comorbid History of pressure wounds, Dementia, Seizure History: Disorder Date Acquired: 11/27/2020 Weeks Of Treatment: 9 Clustered Wound: No Photos Wound Measurements Length: (cm) 5.5 Width: (cm) 4 Depth: (cm) 0.6 Area: (cm) 17.279 Volume: (cm) 10.367 % Reduction in Area: -205.6% % Reduction in Volume: 8.3% Epithelialization: None Tunneling: No Undermining: Yes Starting Position (o'clock): 6 Ending Position (o'clock): 12 Maximum Distance: (cm) 2.3 Wound Description Classification: Category/Stage IV Exudate Amount: Medium Exudate Type: Serosanguineous Exudate Color: red, brown Foul Odor After Cleansing: No Slough/Fibrino Yes Wound Bed Granulation Amount: Medium (34-66%) Exposed Structure Granulation Quality: Red, Pink Fat Layer (Subcutaneous Tissue) Exposed: Yes Necrotic Amount: Medium (34-66%) Muscle Exposed: Yes Necrotic Quality: Adherent Slough Necrosis of Muscle: No Treatment Notes Wound #1 (Sacrum) Cleanser Normal Saline Discharge Instruction: Wash your hands with soap and water. Remove old dressing, discard into plastic bag and place into trash. Cleanse the wound with Normal Saline prior to applying a clean dressing using gauze sponges, not tissues or cotton balls. Do not scrub or use excessive force. Pat dry using gauze sponges, not tissue or cotton balls. CICLEY, MAIN ([id number]) Soap and Water Discharge Instruction: Gently cleanse wound with antibacterial soap, rinse and pat dry prior to dressing wounds Peri-Wound Care Topical Primary Dressing Silvercel 4 1/4x 4 1/4 (in/in) Discharge Instruction: Apply Silvercel 4 1/4x 4 1/4 (in/in) as instructed cut to size of wound and slightly tucked into wound at undermining areas Secondary  Dressing (SILCONE BORDER) [place] Dressing 5x5 (in/in) Discharge Instruction: Please do not put silicone bordered dressings under wraps. Use non-bordered dressing only. Secured With Compression Wrap Compression Stockings Add-Ons Electronic Signature(s) Signed: 02/25/2022 4:32:40 PM By: Levora Dredge Entered By: Levora Dredge on 02/25/2022 13:37:55 [place], Puls[address] ([id number]) -------------------------------------------------------------------------------- Wound Assessment Details Patient Name: MEHWISH, OLAH. Date of Service: 02/25/2022 1:00 PM Medical Record Number: [id number] Patient Account Number: [id number] Date of Birth/Sex: 01-Nov-1945 (75 y.o. F) Treating RN: Levora Dredge Primary Care Naiomi Musto: Oval Linsey Other Clinician: Referring Tomoko : Oval Linsey Treating Jayln Branscom/Extender: Skipper Cliche in Treatment: 9 Wound Status Wound Number: 2 Primary Etiology: Pressure Ulcer Wound Location: Left Ilium Wound Status: Open Wounding Event: Pressure Injury Comorbid History of pressure wounds, Dementia, Seizure History: Disorder Date Acquired: 09/29/2020 Weeks Of Treatment: 9 Clustered Wound: No Photos Wound Measurements Length: (cm) 2.7 Width: (cm) 2.2 Depth: (cm) 0.3 Area: (cm) 4.665 Volume: (cm) 1.4 % Reduction in Area: 52.9% % Reduction in Volume: -41.4% Epithelialization: None Tunneling: No Undermining: No Wound Description Classification: Unstageable/Unclassified Exudate Amount: Medium Exudate Type: Serosanguineous Exudate Color: red, brown Foul Odor After Cleansing: No Slough/Fibrino Yes Wound Bed Granulation Amount: Small (1-33%) Exposed Structure Granulation Quality: Pink Fat Layer (Subcutaneous Tissue) Exposed: Yes Necrotic Amount: Large (67-100%) Necrotic Quality: Eschar, Adherent Slough Treatment Notes Wound #2 (Ilium) Wound Laterality: Left Cleanser Normal Saline Discharge Instruction: Wash your hands  with soap and water. Remove old dressing, discard into plastic bag and place into trash. Cleanse the wound with Normal Saline prior to applying a clean dressing using gauze sponges, not tissues or cotton balls. Do not scrub or use excessive force. Pat dry using gauze sponges, not tissue or cotton balls. Soap and Water Discharge Instruction: Gently cleanse wound with antibacterial soap, rinse and  pat dry prior to dressing wounds Peri-Wound Care MARTITA, STIEFVATER ([id number]) Topical Santyl Collagenase Ointment, 30 (gm), tube Discharge Instruction: apply nickel thick to wound bed only Primary Dressing Gauze Discharge Instruction: As directed moistened with saline Secondary Dressing (SILCONE BORDER) [place] (in/in) Discharge Instruction: Please do not put silicone bordered dressings under wraps. Use non-bordered dressing only. Secured With Compression Wrap Compression Stockings Add-Ons Electronic Signature(s) Signed: 02/25/2022 4:32:40 PM By: Levora Dredge Entered By: Levora Dredge on 02/25/2022 13:38:27 NIKIMA, WIDMER ([id number]) -------------------------------------------------------------------------------- Wound Assessment Details Patient Name: TIRSA, SABIR. Date of Service: 02/25/2022 1:00 PM Medical Record Number: [id number] Patient Account Number: [id number] Date of Birth/Sex: [birth year] (75 y.o. F) Treating RN: Levora Dredge Primary Care Dayton Kenley: Oval Linsey Other Clinician: Referring Oliwia Berzins: Oval Linsey Treating Evynn Boutelle/Extender: Skipper Cliche in Treatment: 9 Wound Status Wound Number: 5 Primary Etiology: Abrasion Wound Location: Left Hand - 5th Digit Wound Status: Open Wounding Event: Skin Tear/Laceration Comorbid History of pressure wounds, Dementia, Seizure History: Disorder Date Acquired: 11/27/2021 Weeks Of Treatment: 9 Clustered Wound: No Photos Wound Measurements Length: (cm) 0.8 Width: (cm)  0.8 Depth: (cm) 0.2 Area: (cm) 0.503 Volume: (cm) 0.101 % Reduction in Area: 28.9% % Reduction in Volume: -42.3% Epithelialization: None Tunneling: No Undermining: No Wound Description Classification: Full Thickness With Exposed Support Structures Exudate Amount: Medium Exudate Type: Serosanguineous Exudate Color: red, brown Foul Odor After Cleansing: No Slough/Fibrino Yes Wound Bed Granulation Amount: Small (1-33%) Exposed Structure Granulation Quality: Red Fat Layer (Subcutaneous Tissue) Exposed: Yes Necrotic Amount: Large (67-100%) Tendon Exposed: Yes Necrotic Quality: Adherent Slough Treatment Notes Wound #5 (Hand - 5th Digit) Wound Laterality: Left Cleanser Normal Saline Discharge Instruction: Wash your hands with soap and water. Remove old dressing, discard into plastic bag and place into trash. Cleanse the wound with Normal Saline prior to applying a clean dressing using gauze sponges, not tissues or cotton balls. Do not scrub or use excessive force. Pat dry using gauze sponges, not tissue or cotton balls. Soap and Water Discharge Instruction: Gently cleanse wound with antibacterial soap, rinse and pat dry prior to dressing wounds Peri-Wound Care LEKEYA, JAMBOR ([id number]) Topical Primary Dressing Xeroform 5x9-HBD (in/in) Discharge Instruction: Apply Xeroform 5x9-HBD (in/in) as directed Double layer keep exposed tendon moist, do not want to dry out Secondary Dressing Coverlet Latex-Free Fabric Adhesive Dressings Discharge Instruction: Knuckle Secured With Compression Wrap Compression Stockings Add-Ons Electronic Signature(s) Signed: 02/25/2022 4:32:40 PM By: Levora Dredge Entered By: Levora Dredge on 02/25/2022 13:48:13 Lollie Sails ([id number]) -------------------------------------------------------------------------------- Vitals Details Patient Name: CHRISTIONA, KRAM Date of Service: 02/25/2022 1:00 PM Medical Record Number: [id number] Patient  Account Number: [id number] Date of Birth/Sex: 02/11/1946 (75 y.o. F) Treating RN: Levora Dredge Primary Care Jayah Balthazar: Oval Linsey Other Clinician: Referring Pinkie Manger: Oval Linsey Treating Ambrosio Reuter/Extender: Skipper Cliche in Treatment: 9 Vital Signs Time Taken: 15:20 Pulse (bpm): 60 Respiratory Rate (breaths/min): 18 Blood Pressure (mmHg): 92/49 Reference Range: 80 - 120 mg / dl Notes unable to ubtain temp, pt sin is warm and dry Electronic Signature(s) Signed: 02/25/2022 4:32:40 PM By: Levora Dredge Entered By: Levora Dredge on 02/25/2022 13:27:46

## 2022-02-25 NOTE — Progress Notes (Addendum)
Janet Gutierrez, Janet Gutierrez ([id number]) Visit Report for 02/25/2022 Chief Complaint Document Details Patient Name: Janet Gutierrez, Janet Gutierrez. Date of Service: 02/25/2022 1:00 PM Medical Record Number: [id number] Patient Account Number: [id number] Date of Birth/Sex: January 16, 1946 (76 y.o. F) Treating RN: Angelina Pih Primary Care Provider: Diamantina Monks Other Clinician: Referring Provider: Diamantina Monks Treating Provider/Extender: Rowan Blase in Treatment: 9 Information Obtained from: Patient Chief Complaint Multiple pressure ulcers sacral and gluteal region and abrasions left hand right hip Electronic Signature(s) Signed: 02/25/2022 1:37:06 PM By: Lenda Kelp PA-C Entered By: Lenda Kelp on 02/25/2022 13:37:06 Janet Gutierrez ([id number]) -------------------------------------------------------------------------------- Debridement Details Patient Name: Janet Gutierrez, Janet Gutierrez Date of Service: 02/25/2022 1:00 PM Medical Record Number: [id number] Patient Account Number: [id number] Date of Birth/Sex: [birth year] (76 y.o. F) Treating RN: Angelina Pih Primary Care Provider: Diamantina Monks Other Clinician: Referring Provider: Diamantina Monks Treating Provider/Extender: Rowan Blase in Treatment: 9 Debridement Performed for Wound #2 Left Ilium Assessment: Performed By: Physician Nelida Meuse., PA-C Debridement Type: Chemical/Enzymatic/Mechanical Agent Used: Santyl Level of Consciousness (Pre- Awake and Alert procedure): Pre-procedure Verification/Time Out Yes - 14:00 Taken: Instrument: Other : santyl Bleeding: None Response to Treatment: Procedure was tolerated well Level of Consciousness (Post- Awake and Alert procedure): Post Debridement Measurements of Total Wound Length: (cm) 2.7 Stage: Unstageable/Unclassified Width: (cm) 2.2 Depth: (cm) 0.3 Volume: (cm) 1.4 Character of Wound/Ulcer Post Debridement: Stable Post Procedure Diagnosis Same as  Pre-procedure Electronic Signature(s) Signed: 02/25/2022 3:03:27 PM By: Angelina Pih Signed: 02/25/2022 5:18:35 PM By: Lenda Kelp PA-C Entered By: Angelina Pih on 02/25/2022 15:03:26 Janet Gutierrez ([id number]) -------------------------------------------------------------------------------- HPI Details Patient Name: Janet Gutierrez, Janet Gutierrez Date of Service: 02/25/2022 1:00 PM Medical Record Number: [id number] Patient Account Number: [id number] Date of Birth/Sex: [birth year] (76 y.o. F) Treating RN: Angelina Pih Primary Care Provider: Diamantina Monks Other Clinician: Referring Provider: Diamantina Monks Treating Provider/Extender: Rowan Blase in Treatment: 9 History of Present Illness HPI Description: 12/19/2021 upon evaluation today patient presents for initial inspection here in our clinic concerning issues that she has with pressure ulcers and abrasions at multiple locations. She has a pressure ulcer on the sacral region and the left ilium region. With that being said she also has an abrasion over the left ilium location as well as an abrasion over the hand fifth digit. Subsequently she is currently not mobile and she was not responding to the conversation during the office visit today either. She is currently at home with her family her child and their spouse. They take good care of her as best they can although she does not have an air mattress I think this could be something of benefit for her and again we will [facility]. Patient has a history of generalized weakness, Alzheimer's disease, and in general does not appear to be doing too great as far as her overall health status is concerned she is extremely weak and not even able to turn herself over in the bed. 01-28-2022 upon evaluation today patient appears to be doing a little bit more poorly unfortunately in regard to her wounds. She was actually under respite care  for time and subsequently her wounds have worsened in the interim that was 4 days last week and 40 days the week before. With that being said the patient does not seem to be having a lot of general increased discomfort which is good news but nonetheless does continue to have significant  wounds that definitely do need to be taken care of very carefully. She does have some eschar still noted over the left hip location we will continue to try to remove this today if at all possible. 02-25-2022 upon evaluation today patient actually appears to be doing much better compared to last time I saw her. Fortunately there does not appear to be any evidence of active infection locally or systemically which is great news and overall I am extremely pleased with where we stand. I do believe she is on the right track here and to be honest I am much happier than it was last time I saw her. Her son does an excellent job taking care of her. Electronic Signature(s) Signed: 02/25/2022 2:13:30 PM By: Lenda Kelp PA-C Entered By: Lenda Kelp on 02/25/2022 14:13:30 Janet Gutierrez ([id number]) -------------------------------------------------------------------------------- Physical Exam Details Patient Name: Janet Gutierrez, Janet Gutierrez Date of Service: 02/25/2022 1:00 PM Medical Record Number: [id number] Patient Account Number: [id number] Date of Birth/Sex: 10/13/1945 (76 y.o. F) Treating RN: Angelina Pih Primary Care Provider: Diamantina Monks Other Clinician: Referring Provider: Diamantina Monks Treating Provider/Extender: Rowan Blase in Treatment: 9 Constitutional Well-nourished and well-hydrated in no acute distress. Respiratory normal breathing without difficulty. Psychiatric Patient is not able to cooperate in decision making regarding care. Patient has dementia. patient is confused. Notes Upon inspection patient's wound bed actually showed signs at all locations of actually doing much better and I am  very pleased with where we stand I do not see any evidence of active infection locally or systemically at this time which is great news. Electronic Signature(s) Signed: 02/25/2022 5:18:35 PM By: Lenda Kelp PA-C Entered By: Lenda Kelp on 02/25/2022 14:13:54 Janet Gutierrez ([id number]) -------------------------------------------------------------------------------- Physician Orders Details Patient Name: Janet Gutierrez, Janet Gutierrez. Date of Service: 02/25/2022 1:00 PM Medical Record Number: [id number] Patient Account Number: [id number] Date of Birth/Sex: 1946/01/12 (75 y.o. F) Treating RN: Angelina Pih Primary Care Provider: Diamantina Monks Other Clinician: Referring Provider: Diamantina Monks Treating Provider/Extender: Rowan Blase in Treatment: 9 Verbal / Phone Orders: No Diagnosis Coding ICD-10 Coding Code Description L89.153 Pressure ulcer of sacral region, stage 3 L89.323 Pressure ulcer of left buttock, stage 3 L89.216 Pressure-induced deep tissue damage of right hip S70.211A Abrasion, right hip, initial encounter S60.512A Abrasion of left hand, initial encounter G30.8 Other Alzheimer's disease Follow-up Appointments o Return Appointment in 2 weeks. - [facility] to [facility] and [facility] dressings-requested due to transportation 2-3 weeks Bathing/ Shower/ Hygiene o Wash wounds with antibacterial soap and water. o May shower; gently cleanse wound with antibacterial soap, rinse and pat dry prior to dressing wounds o No tub bath. Anesthetic (Use 'Patient Medications' Section for Anesthetic Order Entry) o Lidocaine applied to wound bed Off-Loading o Low air-loss mattress (Group 2) - [facility], con't air mattress ordered for patients hospital bed o Turn and reposition every 2 hours Additional Orders / Instructions o Follow Nutritious Diet and Increase Protein Intake Wound Treatment Wound #1 - Sacrum Cleanser: Normal Saline 1 x Per  Day/30 Days Discharge Instructions: Wash your hands with soap and water. Remove old dressing, discard into plastic bag and place into trash. Cleanse the wound with Normal Saline prior to applying a clean dressing using gauze sponges, not tissues or cotton balls. Do not scrub or use excessive force. Pat dry using gauze sponges, not tissue or cotton balls. Cleanser: Soap and Water 1 x Per Day/30 Days Discharge Instructions: Gently cleanse wound with antibacterial  soap, rinse and pat dry prior to dressing wounds Primary Dressing: Silvercel 4 1/4x 4 1/4 (in/in) 1 x Per Day/30 Days Discharge Instructions: Apply Silvercel 4 1/4x 4 1/4 (in/in) as instructed cut to size of wound and slightly tucked into wound at undermining areas Secondary Dressing: (SILCONE BORDER) Zetuvit Plus SILICONE BORDER Dressing 5x5 (in/in) 1 x Per Day/30 Days Discharge Instructions: Please do not put silicone bordered dressings under wraps. Use non-bordered dressing only. Wound #2 - Ilium Wound Laterality: Left Cleanser: Normal Saline 1 x Per Day/30 Days Discharge Instructions: Wash your hands with soap and water. Remove old dressing, discard into plastic bag and place into trash. Cleanse the wound with Normal Saline prior to applying a clean dressing using gauze sponges, not tissues or cotton balls. Do not scrub or use excessive force. Pat dry using gauze sponges, not tissue or cotton balls. Cleanser: Soap and Water 1 x Per Day/30 Days Discharge Instructions: Gently cleanse wound with antibacterial soap, rinse and pat dry prior to dressing wounds Janet Gutierrez, Janet Gutierrez ([id number]) Topical: Santyl Collagenase Ointment, 30 (gm), tube 1 x Per Day/30 Days Discharge Instructions: apply nickel thick to wound bed only Primary Dressing: Gauze 1 x Per Day/30 Days Discharge Instructions: As directed moistened with saline Secondary Dressing: (SILCONE BORDER) Zetuvit Plus SILICONE BORDER Dressing 5x5 (in/in) 1 x Per Day/30 Days Discharge  Instructions: Please do not put silicone bordered dressings under wraps. Use non-bordered dressing only. Wound #5 - Hand - 5th Digit Wound Laterality: Left Cleanser: Normal Saline 1 x Per Day/30 Days Discharge Instructions: Wash your hands with soap and water. Remove old dressing, discard into plastic bag and place into trash. Cleanse the wound with Normal Saline prior to applying a clean dressing using gauze sponges, not tissues or cotton balls. Do not scrub or use excessive force. Pat dry using gauze sponges, not tissue or cotton balls. Cleanser: Soap and Water 1 x Per Day/30 Days Discharge Instructions: Gently cleanse wound with antibacterial soap, rinse and pat dry prior to dressing wounds Primary Dressing: Xeroform 5x9-HBD (in/in) 1 x Per Day/30 Days Discharge Instructions: Apply Xeroform 5x9-HBD (in/in) as directed Double layer keep exposed tendon moist, do not want to dry out Secondary Dressing: Coverlet Latex-Free Fabric Adhesive Dressings 1 x Per Day/30 Days Discharge Instructions: Knuckle Electronic Signature(s) Signed: 02/25/2022 4:32:40 PM By: Angelina Pih Signed: 02/25/2022 5:18:35 PM By: Lenda Kelp PA-C Entered By: Angelina Pih on 02/25/2022 13:57:49 Janet Gutierrez, Janet Gutierrez ([id number]) -------------------------------------------------------------------------------- Problem List Details Patient Name: Janet Gutierrez, Janet Gutierrez. Date of Service: 02/25/2022 1:00 PM Medical Record Number: [id number] Patient Account Number: [id number] Date of Birth/Sex: [birth year] (75 y.o. F) Treating RN: Angelina Pih Primary Care Provider: Diamantina Monks Other Clinician: Referring Provider: Diamantina Monks Treating Provider/Extender: Rowan Blase in Treatment: 9 Active Problems ICD-10 Encounter Code Description Active Date MDM Diagnosis L89.153 Pressure ulcer of sacral region, stage 3 12/19/2021 No Yes L89.323 Pressure ulcer of left buttock, stage 3 12/19/2021 No Yes L89.216  Pressure-induced deep tissue damage of right hip 12/19/2021 No Yes S70.211A Abrasion, right hip, initial encounter 12/19/2021 No Yes S60.512A Abrasion of left hand, initial encounter 12/19/2021 No Yes G30.8 Other Alzheimer's disease 12/19/2021 No Yes Inactive Problems Resolved Problems Electronic Signature(s) Signed: 02/25/2022 1:37:00 PM By: Lenda Kelp PA-C Entered By: Lenda Kelp on 02/25/2022 13:36:59 Janet Gutierrez ([id number]) -------------------------------------------------------------------------------- Progress Note Details Patient Name: Janet Gutierrez Date of Service: 02/25/2022 1:00 PM Medical Record Number: [id number] Patient Account Number: [id number] Date of Birth/Sex: 1945/11/19 (75 y.o. F)  Treating RN: Angelina Pih Primary Care Provider: Diamantina Monks Other Clinician: Referring Provider: Diamantina Monks Treating Provider/Extender: Rowan Blase in Treatment: 9 Subjective Chief Complaint Information obtained from Patient Multiple pressure ulcers sacral and gluteal region and abrasions left hand right hip History of Present Illness (HPI) 12/19/2021 upon evaluation today patient presents for initial inspection here in our clinic concerning issues that she has with pressure ulcers and abrasions at multiple locations. She has a pressure ulcer on the sacral region and the left ilium region. With that being said she also has an abrasion over the left ilium location as well as an abrasion over the hand fifth digit. Subsequently she is currently not mobile and she was not responding to the conversation during the office visit today either. She is currently at home with her family her child and their spouse. They take good care of her as best they can although she does not have an air mattress I think this could be something of benefit for her and again we will get a [facility] to [facility]. Patient has a history of  generalized weakness, Alzheimer's disease, and in general does not appear to be doing too great as far as her overall health status is concerned she is extremely weak and not even able to turn herself over in the bed. 01-28-2022 upon evaluation today patient appears to be doing a little bit more poorly unfortunately in regard to her wounds. She was actually under respite care for time and subsequently her wounds have worsened in the interim that was 4 days last week and 40 days the week before. With that being said the patient does not seem to be having a lot of general increased discomfort which is good news but nonetheless does continue to have significant wounds that definitely do need to be taken care of very carefully. She does have some eschar still noted over the left hip location we will continue to try to remove this today if at all possible. 02-25-2022 upon evaluation today patient actually appears to be doing much better compared to last time I saw her. Fortunately there does not appear to be any evidence of active infection locally or systemically which is great news and overall I am extremely pleased with where we stand. I do believe she is on the right track here and to be honest I am much happier than it was last time I saw her. Her son does an excellent job taking care of her. Objective Constitutional Well-nourished and well-hydrated in no acute distress. Vitals Time Taken: 3:20 PM, Pulse: 60 bpm, Respiratory Rate: 18 breaths/min, Blood Pressure: 92/49 mmHg. General Notes: unable to ubtain temp, pt sin is warm and dry Respiratory normal breathing without difficulty. Psychiatric Patient is not able to cooperate in decision making regarding care. Patient has dementia. patient is confused. General Notes: Upon inspection patient's wound bed actually showed signs at all locations of actually doing much better and I am very pleased with where we stand I do not see any evidence of active  infection locally or systemically at this time which is great news. Integumentary (Hair, Skin) Wound #1 status is Open. Original cause of wound was Pressure Injury. The date acquired was: 11/27/2020. The wound has been in treatment 9 weeks. The wound is located on the Sacrum. The wound measures 5.5cm length x 4cm width x 0.6cm depth; 17.279cm^2 area and 10.367cm^3 volume. There is muscle and Fat  Layer (Subcutaneous Tissue) exposed. There is no tunneling noted, however, there is undermining starting at 6:00 and ending at 12:00 with a maximum distance of 2.3cm. There is a medium amount of serosanguineous drainage noted. There is medium (34-66%) red, pink granulation within the wound bed. There is a medium (34-66%) amount of necrotic tissue within the wound bed including Adherent Slough. Wound #2 status is Open. Original cause of wound was Pressure Injury. The date acquired was: 09/29/2020. The wound has been in treatment 9 weeks. The wound is located on the Left Ilium. The wound measures 2.7cm length x 2.2cm width x 0.3cm depth; 4.665cm^2 area and 1.4cm^3 Janet Gutierrez, Janet Gutierrez. ([id number]) volume. There is Fat Layer (Subcutaneous Tissue) exposed. There is no tunneling or undermining noted. There is a medium amount of serosanguineous drainage noted. There is small (1-33%) pink granulation within the wound bed. There is a large (67-100%) amount of necrotic tissue within the wound bed including Eschar and Adherent Slough. Wound #5 status is Open. Original cause of wound was Skin Tear/Laceration. The date acquired was: 11/27/2021. The wound has been in treatment 9 weeks. The wound is located on the Left Hand - 5th Digit. The wound measures 0.8cm length x 0.8cm width x 0.2cm depth; 0.503cm^2 area and 0.101cm^3 volume. There is tendon and Fat Layer (Subcutaneous Tissue) exposed. There is no tunneling or undermining noted. There is a medium amount of serosanguineous drainage noted. There is small (1-33%) red granulation  within the wound bed. There is a large (67-100%) amount of necrotic tissue within the wound bed including Adherent Slough. Assessment Active Problems ICD-10 Pressure ulcer of sacral region, stage 3 Pressure ulcer of left buttock, stage 3 Pressure-induced deep tissue damage of right hip Abrasion, right hip, initial encounter Abrasion of left hand, initial encounter Other Alzheimer's disease Plan Follow-up Appointments: Return Appointment in 2 weeks. - [facility] to [facility] and supply dress[facility]-requested due to transportation 2-3 weeks Bathing/ Shower/ Hygiene: Wash wounds with antibacterial soap and water. May shower; gently cleanse wound with antibacterial soap, rinse and pat dry prior to dressing wounds No tub bath. Anesthetic (Use 'Patient Medications' Section for Anesthetic Order Entry): Lidocaine applied to wound bed Off-Loading: Low air-loss mattress (Group 2) - [facility] goes [facility], con't air mattress ordered for patients hospital bed Turn and reposition every 2 hours Additional Orders / Instructions: Follow Nutritious Diet and Increase Protein Intake WOUND #1: - Sacrum Wound Laterality: Cleanser: Normal Saline 1 x Per Day/30 Days Discharge Instructions: Wash your hands with soap and water. Remove old dressing, discard into plastic bag and place into trash. Cleanse the wound with Normal Saline prior to applying a clean dressing using gauze sponges, not tissues or cotton balls. Do not scrub or use excessive force. Pat dry using gauze sponges, not tissue or cotton balls. Cleanser: Soap and Water 1 x Per Day/30 Days Discharge Instructions: Gently cleanse wound with antibacterial soap, rinse and pat dry prior to dressing wounds Primary Dressing: Silvercel 4 1/4x 4 1/4 (in/in) 1 x Per Day/30 Days Discharge Instructions: Apply Silvercel 4 1/4x 4 1/4 (in/in) as instructed cut to size of wound and slightly tucked into wound at undermining areas Secondary Dressing:  (SILCONE BORDER) Zetuvit Plus SILICONE BORDER Dressing 5x5 (in/in) 1 x Per Day/30 Days Discharge Instructions: Please do not put silicone bordered dressings under wraps. Use non-bordered dressing only. WOUND #2: - Ilium Wound Laterality: Left Cleanser: Normal Saline 1 x Per Day/30 Days Discharge Instructions: Wash your hands with soap and  water. Remove old dressing, discard into plastic bag and place into trash. Cleanse the wound with Normal Saline prior to applying a clean dressing using gauze sponges, not tissues or cotton balls. Do not scrub or use excessive force. Pat dry using gauze sponges, not tissue or cotton balls. Cleanser: Soap and Water 1 x Per Day/30 Days Discharge Instructions: Gently cleanse wound with antibacterial soap, rinse and pat dry prior to dressing wounds Topical: Santyl Collagenase Ointment, 30 (gm), tube 1 x Per Day/30 Days Discharge Instructions: apply nickel thick to wound bed only Primary Dressing: Gauze 1 x Per Day/30 Days Discharge Instructions: As directed moistened with saline Secondary Dressing: (SILCONE BORDER) Zetuvit Plus SILICONE BORDER Dressing 5x5 (in/in) 1 x Per Day/30 Days Discharge Instructions: Please do not put silicone bordered dressings under wraps. Use non-bordered dressing only. WOUND #5: - Hand - 5th Digit Wound Laterality: Left Cleanser: Normal Saline 1 x Per Day/30 Days Discharge Instructions: Wash your hands with soap and water. Remove old dressing, discard into plastic bag and place into trash. Cleanse the wound with Normal Saline prior to applying a clean dressing using gauze sponges, not tissues or cotton balls. Do not scrub or use excessive force. Pat dry using gauze sponges, not tissue or cotton balls. Cleanser: Soap and Water 1 x Per Day/30 Days Discharge Instructions: Gently cleanse wound with antibacterial soap, rinse and pat dry prior to dressing wounds MEIYA, WISLER ([id number]) Primary Dressing: Xeroform 5x9-HBD (in/in) 1 x  Per Day/30 Days Discharge Instructions: Apply Xeroform 5x9-HBD (in/in) as directed Double layer keep exposed tendon moist, do not want to dry out Secondary Dressing: Coverlet Latex-Free Fabric Adhesive Dressings 1 x Per Day/30 Days Discharge Instructions: Knuckle 1. I am going to suggest that we go ahead and continue with the wound care measures as before for the hip or using the Santyl and for the sacral region where using a silver alginate dressing. Both areas look much better. 2. In regard to her hand in the area over her knuckle which is breaking down there is tendon noted in the base of the wound I think Xeroform gauze is probably to be the best way to go. 3. There was some potential need for sharp debridement but to be perfectly honest I was a little reluctant to do this as her wounds seem to bother her and with the dementia she really does not understand what is going on when we do these things. For that reason I avoided sharp debridement in this patient today. Her son was in agreement with that plan. We will see patient back for reevaluation in 2 weeks here in the clinic. If anything worsens or changes patient will contact our office for additional recommendations. Electronic Signature(s) Signed: 02/25/2022 2:14:46 PM By: Lenda Kelp PA-C Entered By: Lenda Kelp on 02/25/2022 14:14:46 Janet Gutierrez ([id number]) -------------------------------------------------------------------------------- SuperBill Details Patient Name: ERISHA, PAUGH Date of Service: 02/25/2022 Medical Record Number: [id number] Patient Account Number: [id number] Date of Birth/Sex: 11/21/1945 (75 y.o. F) Treating RN: Angelina Pih Primary Care Provider: Diamantina Monks Other Clinician: Referring Provider: Diamantina Monks Treating Provider/Extender: Rowan Blase in Treatment: 9 Diagnosis Coding ICD-10 Codes Code Description [id number] Pressure ulcer of sacral region, stage 3 L89.323 Pressure  ulcer of left buttock, stage 3 L89.216 Pressure-induced deep tissue damage of right hip S70.211A Abrasion, right hip, initial encounter S60.512A Abrasion of left hand, initial encounter G30.8 Other Alzheimer's disease Facility Procedures CPT4 Code: 93810175 Description: 10258 - DEBRIDE W/O ANES NON SELECT Modifier:  Quantity: 1 Physician Procedures CPT4 Code: 8338250 Description: 99214 - WC PHYS LEVEL 4 - EST PT Modifier: Quantity: 1 CPT4 Code: Description: ICD-10 Diagnosis Description L89.153 Pressure ulcer of sacral region, stage 3 L89.323 Pressure ulcer of left buttock, stage 3 L89.216 Pressure-induced deep tissue damage of right hip S70.211A Abrasion, right hip, initial encounter Modifier: Quantity: Electronic Signature(s) Signed: 02/25/2022 3:04:39 PM By: Angelina Pih Signed: 02/25/2022 5:18:35 PM By: Lenda Kelp PA-C Previous Signature: 02/25/2022 3:04:06 PM Version By: Angelina Pih Previous Signature: 02/25/2022 2:59:23 PM Version By: Angelina Pih Previous Signature: 02/25/2022 2:15:17 PM Version By: Lenda Kelp PA-C Previous Signature: 02/25/2022 2:15:03 PM Version By: Lenda Kelp PA-C Entered By: Angelina Pih on 02/25/2022 15:04:39

## 2022-03-11 ENCOUNTER — Ambulatory Visit: Payer: Medicare (Managed Care) | Admitting: Internal Medicine

## 2022-07-16 IMAGING — CT CT HEAD W/O CM
1 of 2 series · 11 of 30 positions shown, 14 images · non-contrast
Comparison: CT examination dated October 13, 2020

CLINICAL DATA: Mental status change, unknown cause.

EXAM:
CT HEAD WITHOUT CONTRAST
TECHNIQUE: Contiguous axial images were obtained from the base of the skull
through the vertex without intravenous contrast.

[Series 4: head 2.0 mpr ax · axial · 0.36mm/px · z∈[-156,-11]mm · 11 of 90 slices shown, 14 images]
[im 5/90  brain]
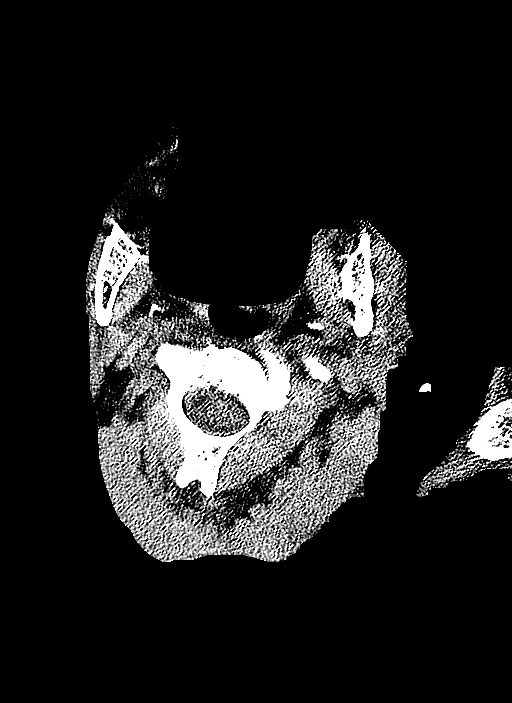
[im 5/90  bone]
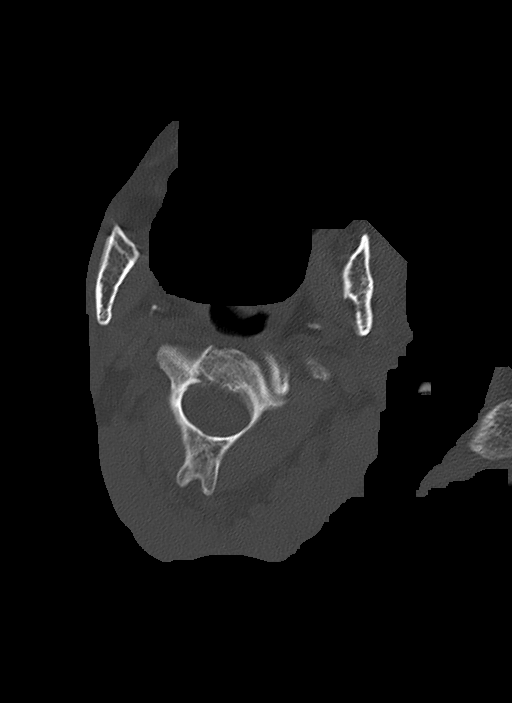
[im 13/90  brain]
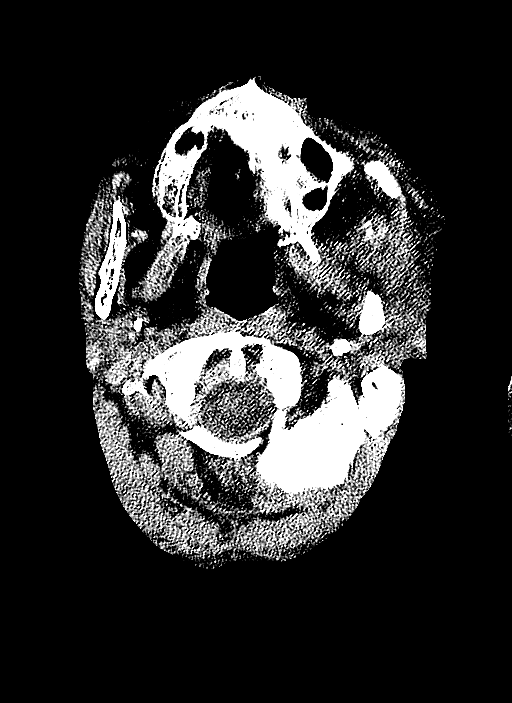
[im 22/90  brain]
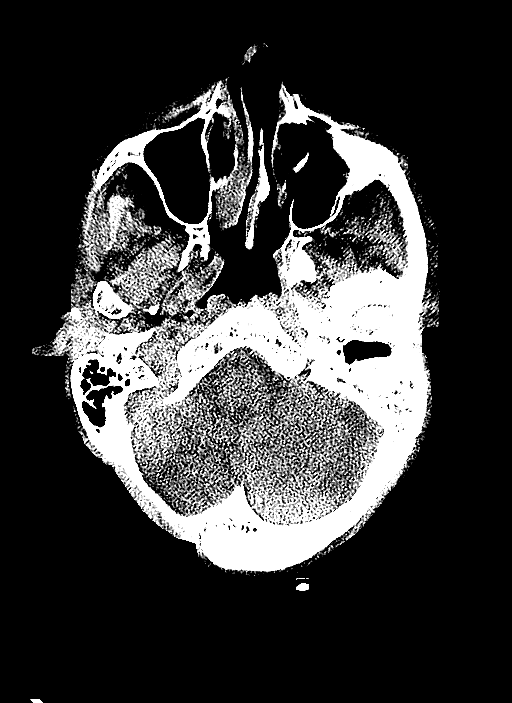
[im 30/90  brain]
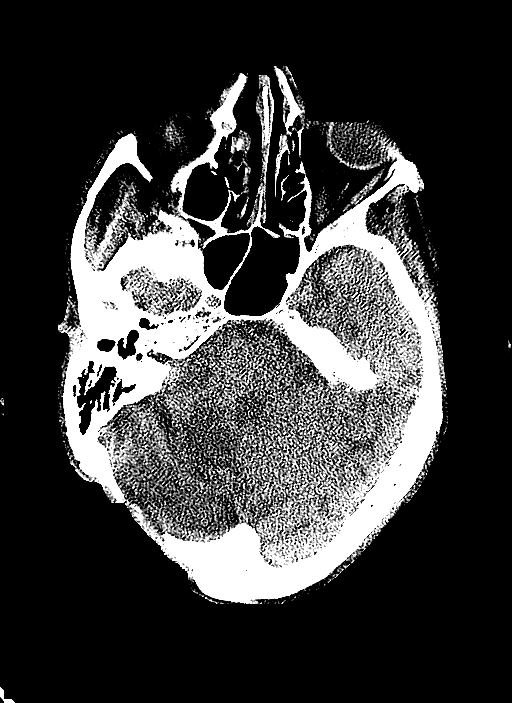
[im 39/90  brain]
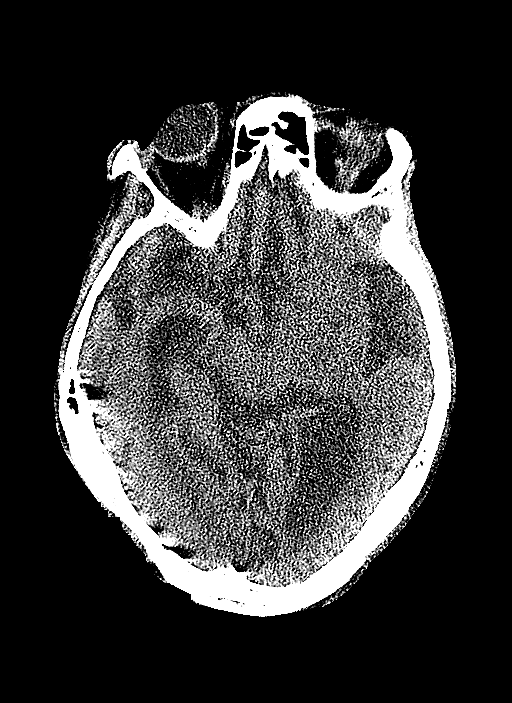
[im 39/90  bone]
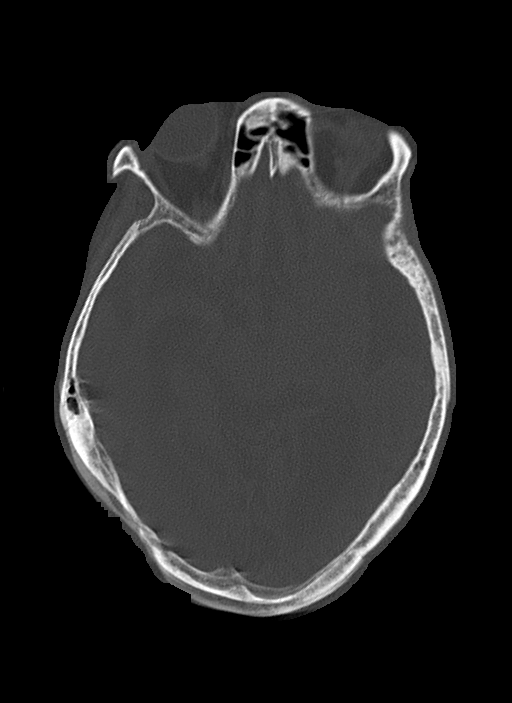
[im 47/90  brain]
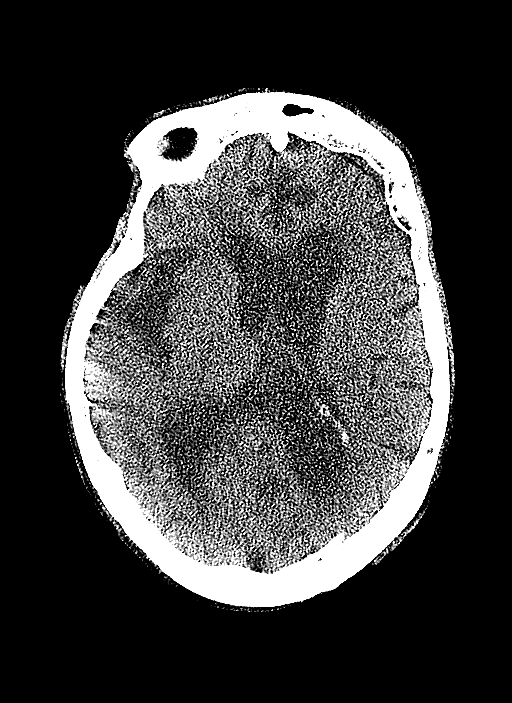
[im 51/90  brain]
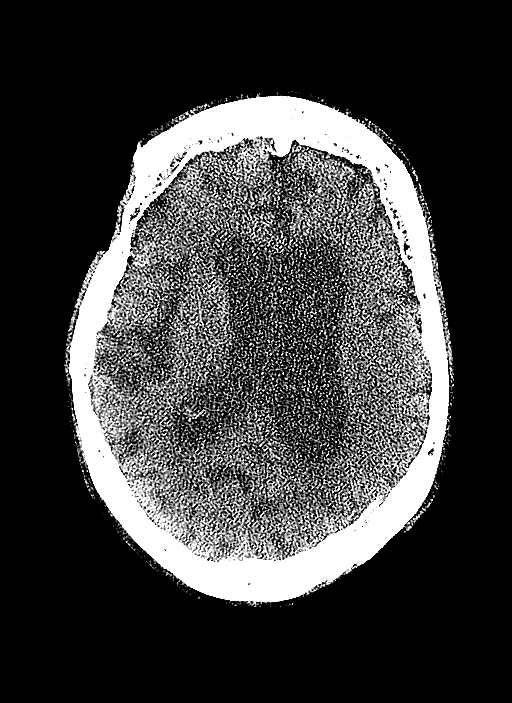
[im 60/90  brain]
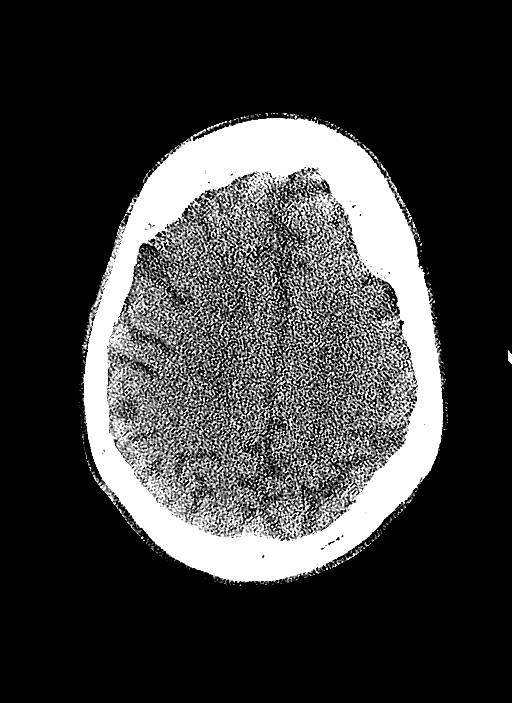
[im 68/90  brain]
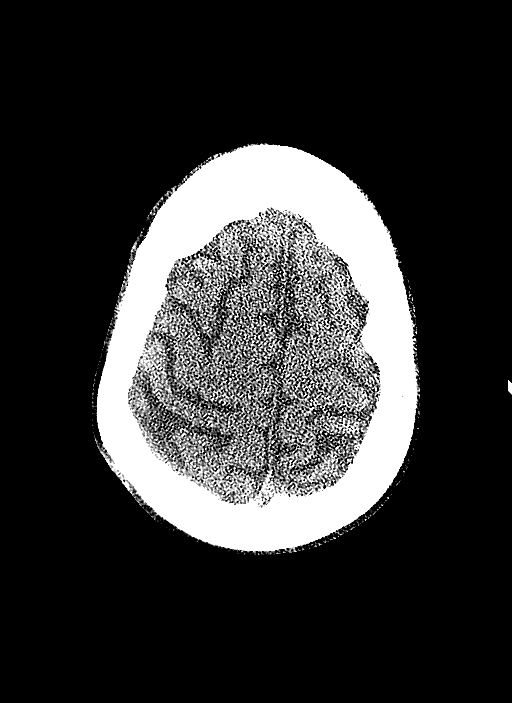
[im 68/90  bone]
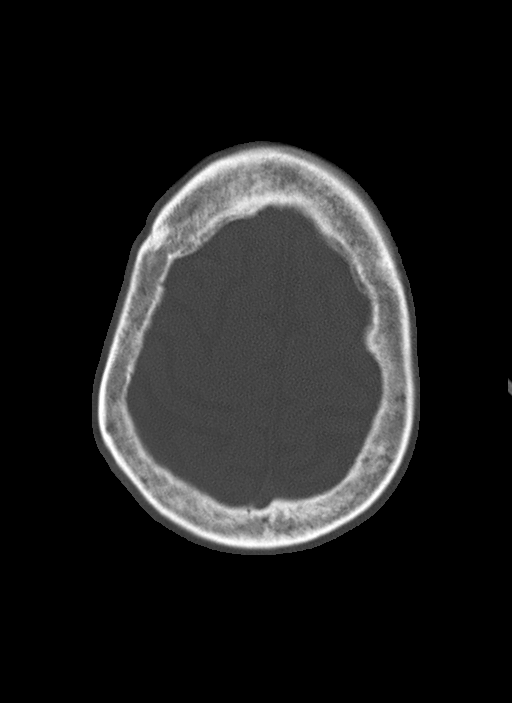
[im 77/90  brain]
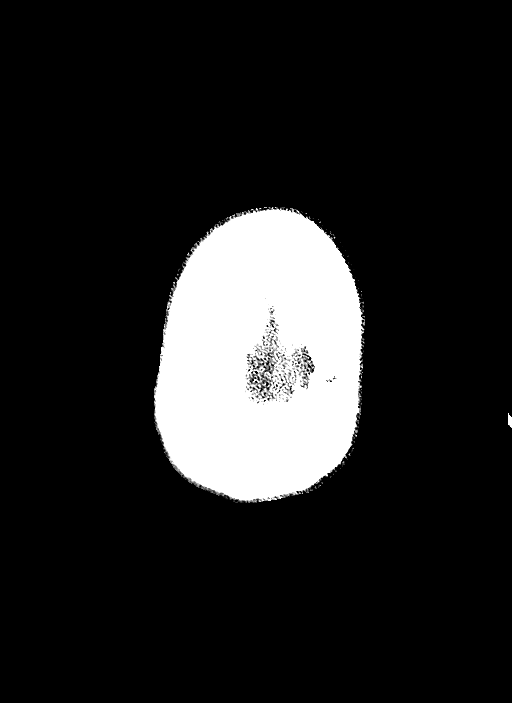
[im 85/90  brain]
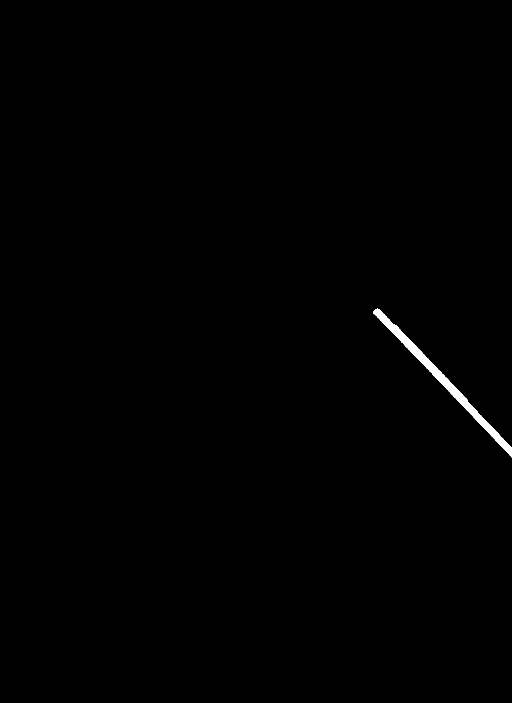

[11 of 30 positions shown; findings below may reference images not displayed]

FINDINGS: Brain: No evidence of acute infarction, hemorrhage, hydrocephalus,
extra-axial collection or mass lesion/mass effect. Prominence of the
ventricles and sulci secondary to moderate cerebral volume loss.
Low-attenuation in the periventricular and subcortical white matter
presumed chronic microvascular ischemic changes. Right occipital
craniectomy and encephalomalacia of the right cerebellum is
unchanged.

Vascular: No hyperdense vessel or unexpected calcification.

Skull: Right occipital craniectomy changes.

Sinuses/Orbits: No acute finding.

Other: None.
IMPRESSION: 1.  No acute intracranial abnormality.

2. Right occipital craniectomy and encephalomalacia of the right
cerebellum, unchanged.

3.  Cerebral atrophy and chronic microvascular ischemic changes.

## 2022-07-31 ENCOUNTER — Other Ambulatory Visit (HOSPITAL_BASED_OUTPATIENT_CLINIC_OR_DEPARTMENT_OTHER): Payer: Self-pay
